# Patient Record
Sex: Female | Born: 1950 | Race: White | Hispanic: No | Marital: Married | Smoking: Never smoker
Health system: Southern US, Community
[De-identification: ages and names within clinical notes are randomized; demographics above are authoritative.]

## PROBLEM LIST (undated history)

## (undated) DIAGNOSIS — I255 Ischemic cardiomyopathy: Secondary | ICD-10-CM

## (undated) DIAGNOSIS — I219 Acute myocardial infarction, unspecified: Secondary | ICD-10-CM

## (undated) DIAGNOSIS — Z95810 Presence of automatic (implantable) cardiac defibrillator: Secondary | ICD-10-CM

## (undated) DIAGNOSIS — I10 Essential (primary) hypertension: Secondary | ICD-10-CM

## (undated) DIAGNOSIS — I509 Heart failure, unspecified: Secondary | ICD-10-CM

## (undated) DIAGNOSIS — I2510 Atherosclerotic heart disease of native coronary artery without angina pectoris: Secondary | ICD-10-CM

## (undated) DIAGNOSIS — E785 Hyperlipidemia, unspecified: Secondary | ICD-10-CM

## (undated) HISTORY — DX: Hyperlipidemia, unspecified: E78.5

## (undated) HISTORY — DX: Acute myocardial infarction, unspecified: I21.9

## (undated) HISTORY — DX: Atherosclerotic heart disease of native coronary artery without angina pectoris: I25.10

## (undated) HISTORY — PX: CARDIAC CATHETERIZATION: SHX172

## (undated) HISTORY — DX: Heart failure, unspecified: I50.9

## (undated) HISTORY — DX: Ischemic cardiomyopathy: I25.5

---

## 1999-06-30 ENCOUNTER — Inpatient Hospital Stay (HOSPITAL_COMMUNITY): Admission: AD | Admit: 1999-06-30 | Discharge: 1999-07-01 | Payer: Self-pay | Admitting: Cardiovascular Disease

## 1999-07-01 ENCOUNTER — Encounter: Payer: Self-pay | Admitting: Cardiovascular Disease

## 2005-01-13 ENCOUNTER — Inpatient Hospital Stay (HOSPITAL_BASED_OUTPATIENT_CLINIC_OR_DEPARTMENT_OTHER): Admission: RE | Admit: 2005-01-13 | Discharge: 2005-01-13 | Payer: Self-pay | Admitting: Cardiovascular Disease

## 2005-02-15 ENCOUNTER — Ambulatory Visit: Payer: Self-pay | Admitting: Internal Medicine

## 2005-02-21 ENCOUNTER — Ambulatory Visit (HOSPITAL_COMMUNITY): Admission: RE | Admit: 2005-02-21 | Discharge: 2005-02-22 | Payer: Self-pay | Admitting: Internal Medicine

## 2005-02-21 ENCOUNTER — Ambulatory Visit: Payer: Self-pay | Admitting: Internal Medicine

## 2005-02-21 HISTORY — PX: CARDIAC DEFIBRILLATOR PLACEMENT: SHX171

## 2005-03-09 ENCOUNTER — Ambulatory Visit: Payer: Self-pay | Admitting: Internal Medicine

## 2005-05-31 ENCOUNTER — Ambulatory Visit: Payer: Self-pay | Admitting: Internal Medicine

## 2006-08-12 ENCOUNTER — Emergency Department (HOSPITAL_COMMUNITY): Admission: EM | Admit: 2006-08-12 | Discharge: 2006-08-12 | Payer: Self-pay | Admitting: Emergency Medicine

## 2009-07-24 LAB — LIPID PANEL
Cholesterol: 222 mg/dL — AB (ref 0–200)
HDL: 46 mg/dL (ref 35–70)
Triglycerides: 130 mg/dL (ref 40–160)

## 2009-12-26 ENCOUNTER — Encounter: Payer: Self-pay | Admitting: Internal Medicine

## 2010-02-08 ENCOUNTER — Encounter (INDEPENDENT_AMBULATORY_CARE_PROVIDER_SITE_OTHER): Payer: Self-pay | Admitting: *Deleted

## 2010-04-20 NOTE — Miscellaneous (Signed)
Summary: Device preload  Clinical Lists Changes  Observations: Added new observation of ICD INDICATN: CM (12/26/2009 17:28) Added new observation of ICDLEADSTAT1: active (12/26/2009 17:28) Added new observation of ICDLEADSER1: [id number] V (12/26/2009 17:28) Added new observation of ICDLEADMOD1: 5366  (12/26/2009 17:28) Added new observation of ICDLEADDOI1: 02/21/2005  (12/26/2009 17:28) Added new observation of ICDLEADLOC1: RV  (12/26/2009 17:28) Added new observation of ICD IMP [name]: Nancy Bunting, [name]  (12/26/2009 17:28) Added new observation of ICD IMPL DTE: 02/21/2005  (12/26/2009 17:28) Added new observation of ICD SERL#: [id number]  (12/26/2009 17:28) Added new observation of ICD MODL#: 7232  (12/26/2009 17:28) Added new observation of ICDMANUFACTR: Medtronic  (12/26/2009 17:28) Added new observation of IDC REFER [name]: Shaune Pascal, [name]  (12/26/2009 17:28) Added new observation of ICD [name]: Nancy Bunting, [name]  (12/26/2009 17:28)       ICD Specifications Following [name]:  Nancy Bunting, [name]     Referring [name]:  Shaune Pascal, [name] ICD Vendor:  Medtronic     ICD Model Number:  7232     ICD Serial Number:  [id number] ICD DOI:  02/21/2005     ICD Implanting [name]:  Nancy Bunting, [name]  Lead 1:    Location: RV     DOI: 02/21/2005     Model #: 3329     Serial #: [id number]     Status: active  Indications::  CM

## 2010-04-20 NOTE — Letter (Signed)
Summary: Appointment - Missed  [facility], Main Office  [address]   [place], [place] [address]   Phone: [phone]  Fax: [phone]     February 08, 2010 MRN: [id number]   [facility] [address] [place], [place]  [address]   Dear Ms. Bhavsar,  Our records indicate you missed your appointment on 02-04-10 with Dr.  Ladona Ridgel.                        It is very important that we reach you to reschedule this appointment. We look forward to participating in your health care needs. Please contact us at the number listed above at your earliest convenience to reschedule this appointment.     Sincerely,    Glass blower/designer

## 2010-05-05 ENCOUNTER — Encounter (INDEPENDENT_AMBULATORY_CARE_PROVIDER_SITE_OTHER): Payer: Self-pay | Admitting: *Deleted

## 2010-05-12 ENCOUNTER — Ambulatory Visit (INDEPENDENT_AMBULATORY_CARE_PROVIDER_SITE_OTHER): Payer: PRIVATE HEALTH INSURANCE | Admitting: Cardiovascular Disease

## 2010-05-12 DIAGNOSIS — I5022 Chronic systolic (congestive) heart failure: Secondary | ICD-10-CM

## 2010-05-12 DIAGNOSIS — Z95810 Presence of automatic (implantable) cardiac defibrillator: Secondary | ICD-10-CM

## 2010-05-12 DIAGNOSIS — I252 Old myocardial infarction: Secondary | ICD-10-CM

## 2010-05-12 NOTE — Letter (Signed)
Summary: Appointment - Reminder 2  [facility], Main Office  [address]   [place], [place] [address]   Phone: [phone]  Fax: [phone]     May 05, 2010 MRN: [id number]   [facility] [address] [place] [address] [place], [place]  [address]   Dear [name],  Our records indicate that it past is time to schedule a follow-up appointment.  Dr.Taylor recommended that you follow up with [place] in November and you missed that appointment. It is very important that we reach you to schedule this appointment. We look forward to participating in your health care needs. Please contact us at the number listed above at your earliest convenience to schedule your appointment.  If you are unable to make an appointment at this time, give [place] a call so we can update our records.     Sincerely,   Glass blower/designer

## 2010-06-01 ENCOUNTER — Encounter: Payer: Self-pay | Admitting: Cardiovascular Disease

## 2010-06-01 DIAGNOSIS — I255 Ischemic cardiomyopathy: Secondary | ICD-10-CM | POA: Insufficient documentation

## 2010-06-01 DIAGNOSIS — Z95810 Presence of automatic (implantable) cardiac defibrillator: Secondary | ICD-10-CM | POA: Insufficient documentation

## 2010-06-01 DIAGNOSIS — I219 Acute myocardial infarction, unspecified: Secondary | ICD-10-CM | POA: Insufficient documentation

## 2010-06-01 DIAGNOSIS — I2510 Atherosclerotic heart disease of native coronary artery without angina pectoris: Secondary | ICD-10-CM | POA: Insufficient documentation

## 2010-06-01 DIAGNOSIS — I5022 Chronic systolic (congestive) heart failure: Secondary | ICD-10-CM | POA: Insufficient documentation

## 2010-06-15 ENCOUNTER — Encounter: Payer: Self-pay | Admitting: Internal Medicine

## 2010-06-15 ENCOUNTER — Ambulatory Visit (INDEPENDENT_AMBULATORY_CARE_PROVIDER_SITE_OTHER): Payer: Self-pay | Admitting: Internal Medicine

## 2010-06-15 DIAGNOSIS — I2510 Atherosclerotic heart disease of native coronary artery without angina pectoris: Secondary | ICD-10-CM

## 2010-06-15 DIAGNOSIS — I2589 Other forms of chronic ischemic heart disease: Secondary | ICD-10-CM

## 2010-06-15 DIAGNOSIS — I509 Heart failure, unspecified: Secondary | ICD-10-CM

## 2010-06-15 DIAGNOSIS — Z95810 Presence of automatic (implantable) cardiac defibrillator: Secondary | ICD-10-CM

## 2010-06-15 DIAGNOSIS — I255 Ischemic cardiomyopathy: Secondary | ICD-10-CM

## 2010-06-15 NOTE — Assessment & Plan Note (Signed)
She is currently angina free. Will follow.

## 2010-06-15 NOTE — Assessment & Plan Note (Signed)
Her CHF symptoms are class 2. I have asked her to maintain a low sodium diet.

## 2010-06-15 NOTE — Patient Instructions (Signed)
Your physician recommends that you schedule a follow-up appointment in: 12 MONTHS 

## 2010-06-15 NOTE — Assessment & Plan Note (Signed)
Her device is working normally. Will recheck in several months. 

## 2010-06-15 NOTE — Progress Notes (Signed)
HPI [name] returns today for ongoing evaluation and maintenance of her ICD in the setting of an ICM, s/p MI. The patient had a remote MI over 7 yrs ago. She has chronic systolic CHF and underwent prophylactic ICD implant almost 5 yrs ago. The patient has done well in the interim with no c/p, sob, or ICD shock. She has minimal peripheral edema. She is currently trying to lose weight.  Allergies  Allergen Reactions  . Codeine Nausea And Vomiting  . Ecotrin (Aspirin)     BLEEDING ULCER  . Nsaids     BLEEDING ULCER  . Sulfa Drugs Cross Reactors Nausea And Vomiting  . Penicillins Rash     Current Outpatient Prescriptions  Medication Sig Dispense Refill  . aspirin 81 MG tablet Take 81 mg by mouth daily.        . carvedilol (COREG) 25 MG tablet Take 37.5 mg by mouth 2 (two) times daily.        Marland Kitchen escitalopram (LEXAPRO) 10 MG tablet Take 10 mg by mouth daily.        . furosemide (LASIX) 40 MG tablet Take 40 mg by mouth daily.        . potassium chloride (KLOR-CON) 10 MEQ CR tablet Take 10 mEq by mouth daily.        . ramipril (ALTACE) 10 MG capsule Take 10 mg by mouth daily.        . rosuvastatin (CRESTOR) 20 MG tablet Take 20 mg by mouth daily.           Past Medical History  Diagnosis Date  . CAD (coronary artery disease)     CHRONICALLY OCCLUDED LAD WITH A LARGE ANTERIOR WALL MI   . CHF (congestive heart failure)     EF 30-40%  . ICD (implantable cardiac defibrillator), single, in situ     MEDTRONIC  . Ischemic cardiomyopathy   . Myocardial infarction     ANTERIOR WALL    ROS:   All systems reviewed and negative except as noted in the HPI.   Past Surgical History  Procedure Date  . Cardiac catheterization 01/13/05, 06/29/99  . Cardiac defibrillator placement 02/21/05    SINGLE/MEDTRONIC     Family History  Problem Relation Age of Onset  . Hypertension Father   . Heart attack Mother   . Coronary artery disease Mother   . Aneurysm Brother      History   Social  History  . Marital Status: Married    Spouse Name: N/A    Number of Children: N/A  . Years of Education: N/A   Occupational History  . Not on file.   Social History Main Topics  . Smoking status: Never Smoker   . Smokeless tobacco: Never Used  . Alcohol Use: No  . Drug Use: Not on file  . Sexually Active: Not on file   Other Topics Concern  . Not on file   Social History Narrative  . No narrative on file     BP 123/71  Pulse 67  Ht 5\' 2"  (1.575 m)  Wt 236 lb (107.049 kg)  BMI 43.17 kg/m2  Physical Exam:  Well appearing NAD HEENT: Unremarkable Neck:  No JVD, no thyromegally Lymphatics:  No adenopathy Back:  No CVA tenderness Lungs:  Clear. Well healed ICD incision. HEART:  Regular rate rhythm, no murmurs or rubs,  Abd:  Flat, positive bowel sounds, no organomegally, no rebound, no guarding Ext:  2 plus pulses, no edema, no cyanosis,  no clubbing Skin:  No rashes no nodules Neuro:  CN II through XII intact, motor grossly intact  DEVICE  Normal device function.  See PaceArt for details.   Assess/Plan:

## 2010-08-06 NOTE — Op Note (Signed)
[name], [name]                 ACCOUNT NO.:  [id number]   MEDICAL RECORD NO.:  [id number]          PATIENT TYPE:  OIB   LOCATION:  2899                         FACILITY:  [facility]   PHYSICIAN:  Doylene Canning. Ladona Ridgel, M.D.  DATE OF BIRTH:  18-Jun-1950   DATE OF PROCEDURE:  02/21/2005  DATE OF DISCHARGE:                                 OPERATIVE REPORT   PROCEDURE PERFORMED:  Implantation of a single chamber ICD.   INDICATION:  Ischemic cardiomyopathy, status post myocardial infarction with  ejection fraction of 25% and class II congestive heart failure.   INTRODUCTION:  The patient is a 60 year old woman who is status post  anterior MI years ago with a chronically occluded LAD. She has a history of  class II congestive heart failure. Her ejection fraction has gradually  decreased over time and most recently in the 20-25% range. The patient is  now referred for ICD implantation.   PROCEDURE:  After informed consent was obtained, the patient taken to the  diagnostic EP lab in the fasting state.  After the usual preparation and  draping, intravenous fentanyl and midazolam was given for sedation.  Lidocaine 30 mL was infiltrated in the left infraclavicular region.  A 9 cm  incision was carried out over this region. Electrocautery utilized dissect  down to the fascial plane. The left subclavian vein was punctured and the  Guidant model 6949 58 cm active fixation defibrillation lead serial number  [id number] was advanced into the right ventricle. Mapping was carried out.  At the final site on the RV septum near the inferior apex, the R-waves  measured 10 mV. The pacing impedance was 768 ohms and pacing threshold was  0.5 volts at 0.5 milliseconds.  A 10 volt pacing did not stimulate the  diaphragm with the lead actively fixed. The lead was secured to the  subpectoralis fascia with a figure-of-eight silk suture. The sewing sleeve  was also secured with silk suture. Electrocautery was utilized to  assure  hemostasis. Electrocautery was utilized to make subcutaneous pocket.  Kanamycin irrigation was utilized to irrigate the pocket. The Medtronic  [facility] VR model 7232 single chamber defibrillator serial number [id number]  was connected to the ventricular pacing lead and placed in the subcutaneous  pocket. Generator was secured with silk suture. Additional kanamycin was  utilized to irrigate the pocket. The generator was secured with silk suture.  At this point, the patient was more deeply sedated defibrillation and  threshold testing carried out.   After the patient was deeply sedated with fentanyl and Versed, VF was  induced with T-wave shock. A 15 joule shock was subsequently delivered  terminating ventricular fibrillation and restoring sinus rhythm.  Five  minutes was allowed to elapse and a second DFT test carried out. Again VF  was induced with T-wave shock again a 15 joule shock was delivered which  terminated ventricular fibrillation and restored sinus rhythm.  At this  point, no additional defibrillation threshold testing was carried out the  incision was closed with layer of 2-0 Vicryl, followed by layer 3-0 Vicryl,  followed by  a layer of 4-0 Vicryl. Benzoin was pained on the skin and Steri-  Strips were applied and pressure dressing was placed. The patient was  returned to her room in satisfactory condition.   COMPLICATIONS:  There were no immediate procedure complications.   RESULTS:  This demonstrates successful implantation of a Medtronic single-  chamber defibrillator in a patient with an ischemic cardiomyopathy and  congestive heart failure.           ______________________________  Doylene Canning. Ladona Ridgel, M.D.     GWT/MEDQ  D:  02/21/2005  T:  02/21/2005  Job:  [id number]   cc:   Vesta Mixer, M.D.  Fax: [phone]   Pricilla Holm, M.D.  Fax: [phone]

## 2010-08-06 NOTE — Cardiovascular Report (Signed)
NAMEROENA, SASSAMAN                 ACCOUNT NO.:  [id number]   MEDICAL RECORD NO.:  [id number]          PATIENT TYPE:  OIB   LOCATION:  1965                         FACILITY:  [facility]   PHYSICIAN:  Vesta Mixer, M.D. DATE OF BIRTH:  1951/01/06   DATE OF PROCEDURE:  01/13/2005  DATE OF DISCHARGE:  01/13/2005                              CARDIAC CATHETERIZATION   [name] is a 60 year old female with a history of coronary artery  disease. She is status post anterior wall myocardial infarction in the past.  Over the year, she has had a gradually and steadily improving left  ventricular systolic function. Recently, she had symptoms consistent with  congestive heart failure. A recent echocardiogram revealed a marked decrease  in her left ventricular function. She is referred for heart catheterization  for further evaluation.   PROCEDURE:  Left heart catheterization with coronary angiography. The right  femoral artery was easily cannulated using modified Seldinger technique.   HEMODYNAMICS:  LV pressure is 163/21 with an aortic pressure of 164/87.   ANGIOGRAPHY:  Left main:  The left main is fairly normal.   The left anterior descending artery reveals an occluded proximal artery.  There is left-to-left collaterals. The diagonal also fills via collaterals.  The left circumflex artery is normal. The right coronary artery is fairly  normal.   The left ventriculogram reveals marked left ventricular dysfunction. There  is anterior apical, apical and inferior apical akinesis. The remainder of  heart is moderately hypokinetic.   COMPLICATIONS:  None.   CONCLUSION:  1.  Single-vessel coronary artery disease involving the left anterior      descending artery. She has evidence of a previous anterior wall      myocardial infarction. We will need to do a resting thallium scan to      check for viability. If she still has viable myocardium, then we will      consider coronary artery bypass  grafting given her progressive left      ventricular dysfunction. She is stable enough to go home today.           ______________________________  Vesta Mixer, M.D.     PJN/MEDQ  D:  01/13/2005  T:  01/13/2005  Job:  [id number]   cc:   Pricilla Holm, M.D.  Fax: [phone]

## 2010-08-06 NOTE — Cardiovascular Report (Signed)
[place]  Patient:    [name], [name]                        MRN: [id number] Proc. Date: 06/29/99 Adm. Date:  [id number] Disc. Date: [id number] Attending:  Koren Bound                        Cardiac Catheterization  PROCEDURES PERFORMED:  Right and left heart catheterization with attempt at PTCA.  INDICATIONS:  [name] is a young white female with a recent onset of congestive heart failure.  She had EKG changes consistent with an anterior wall myocardial  infarction.  An echocardiogram revealed a dilated cardiomyopathy.  She is referred for heart catheterization for further evaluation.  DESCRIPTION OF PROCEDURE:  The right femoral artery and right femoral vein were  easily cannulated using the modified Seldinger technique.  HEMODYNAMICS:  The right atrial pressure had a mean of 16, right ventricular pressure 52/13, pulmonary capillary wedge pressure 29.  Pulmonary artery pressure was 51/23.  Cardiac output was 3.4 L/min, with cardiac index of 1.8 L/min by thermodilution.  There was no mitral valve gradient.  The LV pressure was 135/28, with an aortic  pressure of 135/90.  ANGIOGRAPHY:  LEFT VENTRICULOGRAM:  The left ventriculogram was performed in a 30 degree RAO position.  It revealed moderately to severely dilated left ventricle.  There was diffuse hypokinesis.  There was akinesis/dyskinesis of the anterior wall and apex. The ejection fraction was approximately 20% to 25%.  CORONARY ARTERIES: 1. The left main was relatively smooth and normal.  There were no discrete    stenoses.  2. The left anterior descending artery was severely diseased at its origin with a    diffuse 70% to 80% stenosis.  It was then occluded shortly after the origin. It    gave off one small septal branch.  The mid and distal LAD filled very slowly via    left-to-left, as well as right-to-left, collaterals.  3. The left circumflex artery  was a moderate to large vessel.  It gave off a large    first obtuse marginal artery, as well as a large second obtuse marginal artery.    These vessels are essentially normal.  4. The right coronary artery was large and normal.  There were no significant    stenoses.  The PDA and a small posterolateral segment artery were normal.  PTCA ATTEMPT:  The left main was engaged using a 7-French FL-3 guide.  A Cross-It 100 wire was used in an attempt to cross the stenosis.  We were unable to cross the lesion using the wire alone.  We then placed a 2.5 x 15 mm OpenSail balloon down to provide extra backup.  Despite multiple attempts with this wire, we were still unable to cross the lesion.  We then tried a Cross-It 200 wire with no success.  Follow-up angiography revealed a persistently occluded LAD.  There was no disruption of plaque out into the left main and flow down the left main and circumflex artery were normal.  The small septal branch coming off of the LAD remained patent.  CONCLUSIONS: 1. Recent anterior wall myocardial infarction with complete occlusion of the left    anterior descending artery.  This probably happened several weeks ago. 2. Unsuccessful percutaneous transluminal coronary angioplasty due to inability to    cross the guide wire. 3. Congestive heart failure  due to recent myocardial infarction. DD:  07/16/99 TD:  07/17/99 Job: [id number]

## 2010-08-06 NOTE — Discharge Summary (Signed)
[place]  Patient:    [name], [name]                        MRN: [id number] Adm. Date:  [id number] Disc. Date: [id number] Attending:  Koren Bound CC:         Teena Irani. Arlyce Dice, M.D.                           Discharge Summary  DISCHARGE DIAGNOSES: 1. Ischemic cardiomyopathy. 2. Congestive heart failure. 3. Recent anterior wall myocardial infarction.  DISCHARGE MEDICATIONS: 1. Coumadin 5 mg a day. 2. Lasix 40 mg a day. 3. Potassium chloride 20 mEq a day. 4. Altace 2.5 mg a day. 5. Digoxin 0.125 mg a day. 6. Toprol XL 25 mg a day.  DISPOSITION:  The patient will see Dr. Elease Hashimoto next week for a pro time and office visit.  HISTORY:  [name] is a 60 year old female who presented to the office with a recent onset of congestive heart failure.  She was found to have a globally dilated and hypercontractile left ventricle by echocardiography.  She was referred for heart catheterization for further evaluation.  HOSPITAL COURSE:  Coronary artery disease.  The patient underwent heart catheterization and was found to have an occluded left anterior descending artery.  We tried to open the artery with angioplasty but we were unable to cross the stenosis with a guidewire.  The stenosis appears to be fairly old and the occlusion probably occurred several weeks ago.  Because of the prolonged nature of the procedure and the fact that we were across the left main with a guidewire and catheters, we admitted her for overnight observation.  The patient did not have any complications.  She was discharged in satisfactory condition.  She will be discharged on the above-noted medications.  We will follow her in the office next week.  All of her other medical problems are stable. DD:  07/28/99 TD:  07/29/99 Job: [id number]

## 2010-08-06 NOTE — H&P (Signed)
NAMEPHEBE, DETTMER                 ACCOUNT NO.:  [id number]   MEDICAL RECORD NO.:  [id number]           PATIENT TYPE:   LOCATION:                                 FACILITY:   PHYSICIAN:  Vesta Mixer, M.D.      DATE OF BIRTH:   DATE OF ADMISSION:  01/13/2005  DATE OF DISCHARGE:                                HISTORY & PHYSICAL   [name] is a middle-age female with a history of coronary artery  disease.  She is admitted to the hospital with symptoms of worsening  congestive heart failure.   [name] is a middle-age female with a diagnosis of coronary artery disease.  She had a catheterization in 2001 which revealed an occluded left anterior  descending artery.  She had fairly good collateral filling to her LAD.  We  attempted to do an angioplasty but we were unable to cross the LAD with a  wire.  She has done well on medical therapy and has not really had any  significant episodes of angina.  She has been a little bit noncompliant with  her office visits and has missed several visits over the past several years.  Over the past several years, her left ventricular systolic function has  gradually fallen.  LV function at the time of her original cath revealed an  ejection fraction of around 20 to 25%.  With medical therapy, she increased  her LV function up to approximately 45 to 50%.  Over the next several years,  her EF has fallen such that now her ejection fraction is between 20 and 25%.  She has also had symptoms of congestive heart failure with evidence of leg  edema.  She has not had any episodes of angina.  She also admits to not  really exercising all that much.  She has been able to do most of her usual  activities but with a little bit of shortness of breath.   CURRENT MEDICATIONS:  1.  Cyclophenazine 180 mg a day.  2.  Altace 10 mg p.o. b.i.d.  3.  Coreg 25 mg p.o. b.i.d.  4.  Lexapro 20 mg a day.  5.  Lasix 40 mg a day.  6.  Potassium chloride 10 mEq a day.  7.   Zocor 40 mg a day.   ALLERGIES:  NO KNOWN DRUG ALLERGIES.   PAST MEDICAL HISTORY:  1.  Coronary artery disease - she has an occluded left anterior descending      artery with good collateral filling from left-to-left as well as right-      to-left collaterals.  2.  Congestive heart failure - with recent worsening.  3.  Dyslipidemia.   SOCIAL HISTORY:  The patient is a nonsmoker.  She does not drink alcohol.   FAMILY HISTORY:  Noncontributory.   REVIEW OF SYSTEMS:  Negative except for as noted in the HPI.   PHYSICAL EXAMINATION:  GENERAL APPEARANCE:  She is a middle-age female in no  acute distress.  She is alert and oriented x3 and mood and affect are  normal.  VITAL SIGNS:  Her weight is down 6 pounds to 225.  Her blood pressure is  120/76 with heart rate of 76.  HEENT:  There are 2+ carotids.  She has no bruits, no JVD, no thyromegaly.  LUNGS:  Clear to auscultation.  CARDIOVASCULAR:  Regular rate, S1 and S2.  She has a soft systolic murmur.  ABDOMEN:  Good bowel sounds and is nontender.  EXTREMITIES:  She has no clubbing, cyanosis, or edema.  NEUROLOGIC:  Nonfocal.   [name] presents with recent worsening of her left ventricular systolic  function and clinically has had some episodes of heart failure.  Her left  ventricular systolic function has clearly fallen over the past several  years.  I have recommended we proceed with heart catheterization for further  evaluation.  We discussed the risks, benefits and options of heart  catheterization.  She understands and agrees to proceed.           ______________________________  Vesta Mixer, M.D.     PJN/MEDQ  D:  01/12/2005  T:  01/12/2005  Job:  [id number]   cc:   Pricilla Holm, M.D.  Fax: [phone]

## 2010-08-06 NOTE — Discharge Summary (Signed)
[name], [name]                 ACCOUNT NO.:  [id number]   MEDICAL RECORD NO.:  [id number]          PATIENT TYPE:  OIB   LOCATION:  6532                         FACILITY:  [facility]   PHYSICIAN:  Doylene Canning. Ladona Ridgel, M.D.  DATE OF BIRTH:  Dec 20, 1950   DATE OF ADMISSION:  02/21/2005  DATE OF DISCHARGE:  02/22/2005                                 DISCHARGE SUMMARY   PRIMARY CARE PHYSICIAN:  Gloriajean Dell. Andrey Campanile, M.D.   ALLERGIES:  1.  CODEINE.  2.  PENICILLIN.  3.  ASPIRIN.   PROCEDURES:  Performed during this hospitalization include implantation of  an implantable cardioverter defibrillator, Medtronic.   PRINCIPAL DIAGNOSES:  1.  Coronary artery disease, status post chronic total occlusion of the left      anterior descending artery.  2.  Anterior wall myocardial infarction, many years ago.  3.  History of congestive heart failure.  4.  The patient's left ventricular function has decreased over time with a      range of 20-25%.   SECONDARY DIAGNOSES:  1.  Dyslipidemia.  2.  Congestive heart failure.  3.  Coronary artery disease.   HISTORY OF PRESENT ILLNESS:  The patient is a 60 year old Caucasian female  who was admitted, on February 21, 2005, for implantation of a implantable  cardioverter defibrillator.  The patient had cardio defibrillator placed on  February 21, 2005 with a VVI ICD implant.  Her indications for this implant  were an ischemic cardiomyopathy with an ejection fraction of 25%, class II  CHF.  The patient's device was interrogated, on February 22, 2005, by a  Medtronic rep which showed an impedance in the RV of 544 ohms, a threshold  of 0.5 volts at 0.2 msec, an R wave of 11.0 mV.  Values were within normal  limits.  The patient was seen, on February 22, 2005, had done very well  during the night.  Left shoulder site with no hematoma, Steri-Strips intact.   The patient will be discharged on the following medications:  1.  Lexapro 10 mg p.o. every day.  2.  Ramipril 10  mg p.o. b.i.d.  3.  Avapro 150 mg p.o. every day.  4.  Aldactone 25 mg p.o. every day.  5.  Lasix 40 mg p.o. every day.  6.  Simvastatin 40 mg p.o. q.h.s.  7.  Potassium chloride 10 mEq p.o. every day.  8.  Loratadine 10 mg p.o. every day.  9.  Carvedilol 37.5 mg p.o. b.i.d.   She will also have a followup appointment with Dr. Ladona Ridgel in the pacemaker  clinic on Wednesday, March 09, 2005 at 9 a.m.   The patient was instructed for no heavy lifting or vigorous activity for six  to eight weeks, no driving for seven days, and no showering for seven days.   DURATION OF DISCHARGE:  Less than 30 minutes.     ______________________________  April Humphrey, NP    ______________________________  Doylene Canning. Ladona Ridgel, M.D.    AH/MEDQ  D:  02/22/2005  T:  02/22/2005  Job:  [id number]   cc:  Doylene Canning. Ladona Ridgel, M.D.  [address]  [address]  [place]  [place] [address]

## 2010-09-16 ENCOUNTER — Encounter: Payer: Self-pay | Admitting: *Deleted

## 2010-09-21 ENCOUNTER — Encounter: Payer: Self-pay | Admitting: *Deleted

## 2010-10-07 ENCOUNTER — Other Ambulatory Visit: Payer: Self-pay | Admitting: Cardiovascular Disease

## 2010-10-07 NOTE — Telephone Encounter (Signed)
Fax received from pharmacy. Refill completed. Jodette Aliyah Abeyta RN  

## 2010-10-10 ENCOUNTER — Other Ambulatory Visit: Payer: Self-pay | Admitting: Cardiovascular Disease

## 2010-10-11 ENCOUNTER — Other Ambulatory Visit: Payer: Self-pay | Admitting: *Deleted

## 2010-10-11 MED ORDER — CARVEDILOL 25 MG PO TABS: 37.5000 mg | ORAL_TABLET | Freq: Two times a day (BID) | ORAL | Status: DC

## 2010-10-11 NOTE — Telephone Encounter (Signed)
Fax received from pharmacy. Refill completed. Jodette Lavetta Geier RN  

## 2010-11-05 ENCOUNTER — Other Ambulatory Visit: Payer: Self-pay | Admitting: Cardiovascular Disease

## 2010-11-05 NOTE — Telephone Encounter (Signed)
Fax received from pharmacy. Refill completed. Jodette Kimmy Parish RN  

## 2011-01-09 ENCOUNTER — Other Ambulatory Visit: Payer: Self-pay | Admitting: Cardiovascular Disease

## 2011-01-11 NOTE — Telephone Encounter (Signed)
Pt calling to check on status of refill. Please call it in.

## 2011-01-11 NOTE — Telephone Encounter (Signed)
PT WILL NEED OV FOR NEXT REFILL

## 2011-03-09 ENCOUNTER — Other Ambulatory Visit: Payer: Self-pay | Admitting: Cardiovascular Disease

## 2011-03-28 ENCOUNTER — Encounter: Payer: Self-pay | Admitting: *Deleted

## 2011-03-31 ENCOUNTER — Other Ambulatory Visit: Payer: Self-pay | Admitting: Cardiovascular Disease

## 2011-04-13 ENCOUNTER — Other Ambulatory Visit: Payer: Self-pay | Admitting: Cardiovascular Disease

## 2011-04-14 NOTE — Telephone Encounter (Signed)
Pt needs appointment then refill can be made Fax Received. Refill Completed. [name] (R.M.A)   

## 2011-05-09 ENCOUNTER — Other Ambulatory Visit: Payer: Self-pay | Admitting: Cardiovascular Disease

## 2011-05-09 NOTE — Telephone Encounter (Signed)
Fax Received. Refill Completed. [name] (R.M.A). When pt comes for appt, refills will be made.

## 2011-05-17 ENCOUNTER — Encounter: Payer: Self-pay | Admitting: Cardiovascular Disease

## 2011-05-17 ENCOUNTER — Ambulatory Visit (INDEPENDENT_AMBULATORY_CARE_PROVIDER_SITE_OTHER): Payer: Self-pay | Admitting: Cardiovascular Disease

## 2011-05-17 DIAGNOSIS — I10 Essential (primary) hypertension: Secondary | ICD-10-CM

## 2011-05-17 DIAGNOSIS — I255 Ischemic cardiomyopathy: Secondary | ICD-10-CM

## 2011-05-17 DIAGNOSIS — E785 Hyperlipidemia, unspecified: Secondary | ICD-10-CM

## 2011-05-17 DIAGNOSIS — I2510 Atherosclerotic heart disease of native coronary artery without angina pectoris: Secondary | ICD-10-CM

## 2011-05-17 DIAGNOSIS — I2589 Other forms of chronic ischemic heart disease: Secondary | ICD-10-CM

## 2011-05-17 DIAGNOSIS — I509 Heart failure, unspecified: Secondary | ICD-10-CM

## 2011-05-17 NOTE — Progress Notes (Signed)
[name] Date of Birth  10-27-1950 [facility]     [place] Office  [address]    [address]   [address] [place], [place]  [address]    [place], [place]  [address] [phone]  Fax  [phone]  [phone]  Fax [phone]  Problem list: 1. Coronary artery disease-status post chronic LAD occlusion with a large anterior wall myocardial infarction 2. Congestive heart failure-EF of 30-40% 3. AICD placement 4. Hyperlipidemia 5 obesity  History of Present Illness:  [name] is doing very well from a cardiac standpoint. She's not had any episodes of chest pain or shortness of breath. She is able to do all of her normal activities without any significant problems.  She has not had much success in losing weight  She denies any syncope or presyncope.  Current Outpatient Prescriptions on File Prior to Visit  Medication Sig Dispense Refill  . ALTACE 10 MG capsule TAKE ONE CAPSULE BY MOUTH TWICE DAILY  60 each  5  . aspirin 81 MG tablet Take 81 mg by mouth daily.        . carvedilol (COREG) 25 MG tablet TAKE ONE & ONE-HALF TABLETS BY MOUTH TWICE DAILY  90 tablet  2  . escitalopram (LEXAPRO) 10 MG tablet Take 10 mg by mouth daily.        . furosemide (LASIX) 40 MG tablet TAKE ONE TABLET BY MOUTH EVERY DAY  30 tablet  0  . potassium chloride (MICRO-K) 10 MEQ CR capsule TAKE ONE CAPSULE BY MOUTH EVERY DAY  90 capsule  3  . rosuvastatin (CRESTOR) 20 MG tablet Take 20 mg by mouth daily.          Allergies  Allergen Reactions  . Codeine Nausea And Vomiting  . Ecotrin (Aspirin)     BLEEDING ULCER  . Nsaids     BLEEDING ULCER  . Sulfa Drugs Cross Reactors Nausea And Vomiting  . Penicillins Rash    Past Medical History  Diagnosis Date  . CAD (coronary artery disease)     CHRONICALLY OCCLUDED LAD WITH A LARGE ANTERIOR WALL MI   . CHF (congestive heart failure)     EF 30-40%  . ICD (implantable cardiac defibrillator), single, in situ     MEDTRONIC  . Ischemic  cardiomyopathy   . Myocardial infarction     ANTERIOR WALL    Past Surgical History  Procedure Date  . Cardiac catheterization 01/13/05, 06/29/99  . Cardiac defibrillator placement 02/21/05    SINGLE/MEDTRONIC    History  Smoking status  . Never Smoker   Smokeless tobacco  . Never Used    History  Alcohol Use No    Family History  Problem Relation Age of Onset  . Hypertension Father   . Heart attack Mother   . Coronary artery disease Mother   . Aneurysm Brother     Reviw of Systems:  Reviewed in the HPI.  All other systems are negative.  Physical Exam: Blood pressure 129/75, pulse 67, height 5\' 2"  (1.575 m), weight 235 lb (106.595 kg). General: Well developed, well nourished, in no acute distress.  She is moderately obese.  Head: Normocephalic, atraumatic, sclera non-icteric, mucus membranes are moist,   Neck: Supple. Carotids are 2 + without bruits. No JVD  Lungs: Clear bilaterally to auscultation.  Heart: regular rate  With normal  S1 S2. No murmurs, gallops or rubs.  Abdomen: Soft, non-tender, non-distended with normal bowel sounds. No hepatomegaly. She is  obese. No rebound/guarding. No masses.  Msk:  Strength and tone are normal  Extremities: No clubbing or cyanosis. No edema.  Distal pedal pulses are 2+ and equal bilaterally.  Neuro: Alert and oriented X 3.  Psych:  Responds to questions appropriately with a normal affect.  ECG: Normal sinus rhythm. Low-voltage QRS. There are no changes from her previous tracing.  Assessment / Plan:

## 2011-05-17 NOTE — Assessment & Plan Note (Signed)
[name] seems to be doing fairly well. She's not having any severe symptoms of chest pain or shortness breath. We'll continue with her same medication. She's had an AICD placed for her EF of around 30%. She's not had any ventricular tachycardia.  We will continue with her same medications  Once again asked her to work on a good diet and exercise program.

## 2011-05-17 NOTE — Patient Instructions (Signed)
Your physician wants you to follow-up in: 6 months  You will receive a reminder letter in the mail two months in advance. If you don't receive a letter, please call our office to schedule the follow-up appointment.  Your physician recommends that you return for a FASTING lipid profile: 6 months   

## 2011-06-13 ENCOUNTER — Other Ambulatory Visit: Payer: Self-pay | Admitting: Cardiovascular Disease

## 2011-06-17 ENCOUNTER — Other Ambulatory Visit: Payer: Self-pay | Admitting: Internal Medicine

## 2011-06-20 ENCOUNTER — Other Ambulatory Visit: Payer: Self-pay

## 2011-06-20 MED ORDER — CARVEDILOL 25 MG PO TABS: 25.0000 mg | ORAL_TABLET | ORAL | Status: DC

## 2011-07-05 ENCOUNTER — Ambulatory Visit (INDEPENDENT_AMBULATORY_CARE_PROVIDER_SITE_OTHER): Payer: Self-pay | Admitting: Internal Medicine

## 2011-07-05 ENCOUNTER — Encounter: Payer: Self-pay | Admitting: Internal Medicine

## 2011-07-05 VITALS — BP 124/70 | HR 66 | Ht 62.0 in | Wt 234.1 lb

## 2011-07-05 DIAGNOSIS — I509 Heart failure, unspecified: Secondary | ICD-10-CM

## 2011-07-05 DIAGNOSIS — Z95810 Presence of automatic (implantable) cardiac defibrillator: Secondary | ICD-10-CM

## 2011-07-05 DIAGNOSIS — E785 Hyperlipidemia, unspecified: Secondary | ICD-10-CM | POA: Insufficient documentation

## 2011-07-05 DIAGNOSIS — I255 Ischemic cardiomyopathy: Secondary | ICD-10-CM

## 2011-07-05 DIAGNOSIS — I2589 Other forms of chronic ischemic heart disease: Secondary | ICD-10-CM

## 2011-07-05 LAB — ICD DEVICE OBSERVATION
BATTERY VOLTAGE: 3 V
DEV-0020ICD: NEGATIVE
PACEART VT: 0
RV LEAD AMPLITUDE: 8.2 mv
TZAT-0004FASTVT: 8
TZAT-0005FASTVT: 88 pct
TZAT-0011SLOWVT: 10 ms
TZAT-0011SLOWVT: 10 ms
TZAT-0012FASTVT: 200 ms
TZAT-0012SLOWVT: 200 ms
TZAT-0012SLOWVT: 200 ms
TZAT-0013FASTVT: 1
TZAT-0019SLOWVT: 8 V
TZAT-0019SLOWVT: 8 V
TZON-0003FASTVT: 240 ms
TZON-0003SLOWVT: 340 ms
TZON-0005SLOWVT: 12
TZON-0008SLOWVT: 0 ms
TZON-0010AFLUTTER: 30 ms
TZON-0011AFLUTTER: 70
TZST-0001FASTVT: 3
TZST-0001FASTVT: 5
TZST-0001SLOWVT: 4
TZST-0001SLOWVT: 6
TZST-0003FASTVT: 35 J
TZST-0003FASTVT: 35 J
TZST-0003FASTVT: 35 J
TZST-0003SLOWVT: 25 J
TZST-0003SLOWVT: 35 J
VENTRICULAR PACING ICD: 0 pct

## 2011-07-05 NOTE — Assessment & Plan Note (Signed)
The patient has done very well on Crestor. Unfortunately she has had trouble paying for her prescriptions. We will switch her to generic atorvastatin.

## 2011-07-05 NOTE — Assessment & Plan Note (Signed)
Her symptoms are class II. Today we will try and switch her to a different ACE inhibitor. I discussed this with her primary cardiologist.

## 2011-07-05 NOTE — Assessment & Plan Note (Signed)
Her device is working normally. We'll plan to recheck in several months. 

## 2011-07-05 NOTE — Patient Instructions (Signed)
Your physician wants you to follow-up in: 12 months with Dr Court Joy will receive a reminder letter in the mail two months in advance. If you don't receive a letter, please call our office to schedule the follow-up appointment.   Remote monitoring is used to monitor your Pacemaker of ICD from home. This monitoring reduces the number of office visits required to check your device to one time per year. It allows [place] to keep an eye on the functioning of your device to ensure it is working properly. You are scheduled for a device check from home on 10/06/2011. You may send your transmission at any time that day. If you have a wireless device, the transmission will be sent automatically. After your physician reviews your transmission, you will receive a postcard with your next transmission date.

## 2011-07-05 NOTE — Progress Notes (Signed)
HPI [name] returns today for followup. She is a very pleasant middle-aged woman with an ischemic cardiomyopathy status post myocardial infarction. She has chronic class II congestive heart failure. She has dyslipidemia. In the interim, she has done well. She denies chest pain, shortness of breath, or peripheral edema. She has been bothered by the high cost of her medications. Allergies  Allergen Reactions  . Codeine Nausea And Vomiting  . Ecotrin (Aspirin)     BLEEDING ULCER  . Nsaids     BLEEDING ULCER  . Sulfa Drugs Cross Reactors Nausea And Vomiting  . Penicillins Rash     Current Outpatient Prescriptions  Medication Sig Dispense Refill  . ALTACE 10 MG capsule TAKE ONE CAPSULE BY MOUTH TWICE DAILY  60 each  5  . aspirin 81 MG tablet Take 81 mg by mouth daily.        . carvedilol (COREG) 25 MG tablet Take 1 tablet (25 mg total) by mouth as directed. TAKE ONE & ONE-HALF TABLETS BY MOUTH TWICE DAILY  90 tablet  2  . escitalopram (LEXAPRO) 10 MG tablet Take 10 mg by mouth daily.        . furosemide (LASIX) 40 MG tablet TAKE ONE TABLET BY MOUTH EVERY DAY  30 tablet  5  . potassium chloride (MICRO-K) 10 MEQ CR capsule TAKE ONE CAPSULE BY MOUTH EVERY DAY  90 capsule  3  . rosuvastatin (CRESTOR) 20 MG tablet Take 20 mg by mouth daily.           Past Medical History  Diagnosis Date  . CAD (coronary artery disease)     CHRONICALLY OCCLUDED LAD WITH A LARGE ANTERIOR WALL MI   . CHF (congestive heart failure)     EF 30-40%  . ICD (implantable cardiac defibrillator), single, in situ     [employer]TRONIC  . Ischemic cardiomyopathy   . Myocardial infarction     ANTERIOR WALL  . Dyslipidemia     ROS:   All systems reviewed and negative except as noted in the HPI.   Past Surgical History  Procedure Date  . Cardiac catheterization 01/13/05, 06/29/99  . Cardiac defibrillator placement 02/21/05    SINGLE/MEDTRONIC     Family History  Problem Relation Age of Onset  . Hypertension  Father   . Heart attack Mother   . Coronary artery disease Mother   . Aneurysm Brother      History   Social History  . Marital Status: Married    Spouse Name: N/A    Number of Children: N/A  . Years of Education: N/A   Occupational History  . Not on file.   Social History Main Topics  . Smoking status: Never Smoker   . Smokeless tobacco: Never Used  . Alcohol Use: No  . Drug Use: Not on file  . Sexually Active: Not on file   Other Topics Concern  . Not on file   Social History Narrative  . No narrative on file     BP 124/70  Pulse 66  Ht 5\' 2"  (1.575 m)  Wt 106.196 kg (234 lb 1.9 oz)  BMI 42.82 kg/m2  Physical Exam:  Well appearing middle-aged woman, NAD HEENT: Unremarkable Neck:  No JVD, no thyromegally Lymphatics:  No adenopathy Back:  No CVA tenderness Lungs:  Clear with no wheezes, rales, or rhonchi. HEART:  Regular rate rhythm, no murmurs, no rubs, no clicks Abd:  soft, positive bowel sounds, no organomegally, no rebound, no guarding Ext:  2 plus pulses, no edema, no cyanosis, no clubbing Skin:  No rashes no nodules Neuro:  CN II through XII intact, motor grossly intact   DEVICE  Normal device function.  See PaceArt for details.   Assess/Plan:

## 2011-07-07 ENCOUNTER — Encounter: Payer: Self-pay | Admitting: *Deleted

## 2011-07-13 ENCOUNTER — Encounter: Payer: Self-pay | Admitting: *Deleted

## 2011-07-13 ENCOUNTER — Telehealth: Payer: Self-pay | Admitting: Internal Medicine

## 2011-07-13 MED ORDER — ATORVASTATIN CALCIUM 40 MG PO TABS: 40.0000 mg | ORAL_TABLET | Freq: Every day | ORAL | Status: DC

## 2011-07-13 MED ORDER — LISINOPRIL 20 MG PO TABS: 20.0000 mg | ORAL_TABLET | Freq: Every day | ORAL | Status: DC

## 2011-07-13 MED ORDER — POTASSIUM CHLORIDE ER 10 MEQ PO TBCR: 10.0000 meq | EXTENDED_RELEASE_TABLET | Freq: Every day | ORAL | Status: DC

## 2011-07-13 NOTE — Telephone Encounter (Addendum)
Pt taking altace 10 mg and the potassium and dr Ladona Ridgel was to check with dr Elease Hashimoto to see if ok that pt went on generic and call it in and rx not at pharmacy, [facility] [place], pls call pt , when call cell number you have to dial [phone]

## 2011-07-13 NOTE — Telephone Encounter (Signed)
Medications called in. Patient aware.

## 2011-07-25 ENCOUNTER — Telehealth: Payer: Self-pay | Admitting: Internal Medicine

## 2011-07-25 NOTE — Telephone Encounter (Signed)
New msg Pt was calling about missing transmission

## 2011-07-25 NOTE — Telephone Encounter (Signed)
Pt received a letter for not sending transmission on 07-07-11 but pt was seen in office by GT on 07-05-11. Instructed pt to disregard letter.

## 2011-09-19 ENCOUNTER — Other Ambulatory Visit: Payer: Self-pay | Admitting: Internal Medicine

## 2011-09-19 NOTE — Telephone Encounter (Signed)
Fax Received. Refill Completed. [name] (R.M.A)   

## 2011-10-06 ENCOUNTER — Ambulatory Visit (INDEPENDENT_AMBULATORY_CARE_PROVIDER_SITE_OTHER): Payer: Self-pay | Admitting: *Deleted

## 2011-10-06 ENCOUNTER — Encounter: Payer: Self-pay | Admitting: Internal Medicine

## 2011-10-06 DIAGNOSIS — I255 Ischemic cardiomyopathy: Secondary | ICD-10-CM

## 2011-10-06 DIAGNOSIS — I2589 Other forms of chronic ischemic heart disease: Secondary | ICD-10-CM

## 2011-10-12 LAB — REMOTE ICD DEVICE
BATTERY VOLTAGE: 3 V
BRDY-0002RV: 40 {beats}/min
CHARGE TIME: 8.97 s
DEV-0020ICD: NEGATIVE
TZAT-0001FASTVT: 1
TZAT-0004FASTVT: 8
TZAT-0004SLOWVT: 8
TZAT-0004SLOWVT: 8
TZAT-0005FASTVT: 88 pct
TZAT-0011SLOWVT: 10 ms
TZAT-0011SLOWVT: 10 ms
TZAT-0012SLOWVT: 200 ms
TZAT-0012SLOWVT: 200 ms
TZAT-0013FASTVT: 1
TZAT-0019SLOWVT: 8 V
TZAT-0019SLOWVT: 8 V
TZAT-0020SLOWVT: 1.6 ms
TZON-0003SLOWVT: 340 ms
TZON-0010AFLUTTER: 30 ms
TZON-0011AFLUTTER: 70
TZST-0001FASTVT: 2
TZST-0001FASTVT: 4
TZST-0001FASTVT: 5
TZST-0001SLOWVT: 5
TZST-0003FASTVT: 25 J
TZST-0003FASTVT: 35 J
TZST-0003FASTVT: 35 J
TZST-0003FASTVT: 35 J
TZST-0003SLOWVT: 25 J
TZST-0003SLOWVT: 35 J

## 2011-11-01 ENCOUNTER — Encounter: Payer: Self-pay | Admitting: *Deleted

## 2011-12-02 ENCOUNTER — Other Ambulatory Visit: Payer: Self-pay | Admitting: Cardiovascular Disease

## 2011-12-02 NOTE — Telephone Encounter (Signed)
Fax Received. Refill Completed. [name] (R.M.A)   

## 2012-01-16 ENCOUNTER — Encounter: Payer: Self-pay | Admitting: Internal Medicine

## 2012-01-16 ENCOUNTER — Ambulatory Visit (INDEPENDENT_AMBULATORY_CARE_PROVIDER_SITE_OTHER): Payer: Self-pay | Admitting: *Deleted

## 2012-01-16 ENCOUNTER — Other Ambulatory Visit: Payer: Self-pay | Admitting: Internal Medicine

## 2012-01-16 DIAGNOSIS — I255 Ischemic cardiomyopathy: Secondary | ICD-10-CM

## 2012-01-16 DIAGNOSIS — I2589 Other forms of chronic ischemic heart disease: Secondary | ICD-10-CM

## 2012-01-18 LAB — REMOTE ICD DEVICE
BATTERY VOLTAGE: 2.97 V
RV LEAD AMPLITUDE: 8.2 mv
RV LEAD IMPEDENCE ICD: 448 Ohm
TOT-0006: 20130718000000
TZAT-0001SLOWVT: 1
TZAT-0001SLOWVT: 2
TZAT-0005FASTVT: 88 pct
TZAT-0011FASTVT: 10 ms
TZAT-0012FASTVT: 200 ms
TZAT-0018FASTVT: NEGATIVE
TZAT-0018SLOWVT: NEGATIVE
TZAT-0018SLOWVT: NEGATIVE
TZAT-0019SLOWVT: 8 V
TZAT-0019SLOWVT: 8 V
TZAT-0020SLOWVT: 1.6 ms
TZAT-0020SLOWVT: 1.6 ms
TZON-0003FASTVT: 240 ms
TZON-0005SLOWVT: 12
TZON-0008SLOWVT: 0 ms
TZON-0010AFLUTTER: 30 ms
TZON-0010SLOWVT: 30 ms
TZON-0011AFLUTTER: 70
TZST-0001FASTVT: 3
TZST-0001FASTVT: 5
TZST-0001FASTVT: 6
TZST-0001SLOWVT: 4
TZST-0001SLOWVT: 5
TZST-0003FASTVT: 35 J
TZST-0003FASTVT: 35 J
TZST-0003SLOWVT: 25 J
TZST-0003SLOWVT: 35 J
TZST-0003SLOWVT: 35 J
VENTRICULAR PACING ICD: 0 pct

## 2012-02-08 ENCOUNTER — Encounter: Payer: Self-pay | Admitting: *Deleted

## 2012-04-23 ENCOUNTER — Encounter: Payer: Self-pay | Admitting: *Deleted

## 2012-04-27 ENCOUNTER — Encounter: Payer: Self-pay | Admitting: *Deleted

## 2012-05-07 ENCOUNTER — Other Ambulatory Visit: Payer: Self-pay | Admitting: *Deleted

## 2012-05-07 NOTE — Telephone Encounter (Signed)
Per refill line 

## 2012-05-08 ENCOUNTER — Other Ambulatory Visit: Payer: Self-pay

## 2012-05-08 MED ORDER — FUROSEMIDE 40 MG PO TABS: 40.0000 mg | ORAL_TABLET | Freq: Every day | ORAL | Status: DC

## 2012-06-21 ENCOUNTER — Telehealth: Payer: Self-pay | Admitting: Cardiovascular Disease

## 2012-06-21 ENCOUNTER — Other Ambulatory Visit: Payer: Self-pay | Admitting: Internal Medicine

## 2012-06-21 NOTE — Telephone Encounter (Signed)
New problem   Pt is having neck pain/ears stopped up and neck looks swollen. Pt would like to talk to a nurse about these symptoms.

## 2012-06-21 NOTE — Telephone Encounter (Signed)
Follow Up   Pt following up on call from earlier. Called in again to speak to nurse.

## 2012-06-21 NOTE — Telephone Encounter (Signed)
Will forward pt complaint to Dennis Bast to discuss with Dr. Ladona Ridgel.

## 2012-06-21 NOTE — Telephone Encounter (Signed)
Pt c/o poss sinus infection and left ear and neck discomfort when she lays down at night, wonders if it could be blocked arteries in her neck? Told pt to have her pcp see her to evaluate and they will send her her if they feel its neccessary, pt agreed to plan.

## 2012-06-22 ENCOUNTER — Ambulatory Visit: Payer: Self-pay | Admitting: Cardiovascular Disease

## 2012-07-09 ENCOUNTER — Other Ambulatory Visit: Payer: Self-pay | Admitting: Internal Medicine

## 2012-07-09 ENCOUNTER — Encounter: Payer: Self-pay | Admitting: Emergency Medicine

## 2012-07-23 ENCOUNTER — Other Ambulatory Visit: Payer: Self-pay | Admitting: Internal Medicine

## 2012-07-24 ENCOUNTER — Encounter: Payer: Self-pay | Admitting: Internal Medicine

## 2012-07-28 ENCOUNTER — Other Ambulatory Visit: Payer: Self-pay | Admitting: Internal Medicine

## 2012-07-30 ENCOUNTER — Ambulatory Visit: Payer: Self-pay | Admitting: Cardiovascular Disease

## 2012-07-31 ENCOUNTER — Other Ambulatory Visit: Payer: Self-pay | Admitting: *Deleted

## 2012-07-31 MED ORDER — FUROSEMIDE 40 MG PO TABS: 40.0000 mg | ORAL_TABLET | Freq: Every day | ORAL | Status: DC

## 2012-07-31 NOTE — Telephone Encounter (Signed)
Fax Received. Refill Completed. Horton Ellithorpe Chowoe (R.M.A)  No refills until appointment  

## 2012-08-10 ENCOUNTER — Encounter: Payer: Self-pay | Admitting: Internal Medicine

## 2012-08-10 ENCOUNTER — Ambulatory Visit (INDEPENDENT_AMBULATORY_CARE_PROVIDER_SITE_OTHER): Payer: BC Managed Care – PPO | Admitting: Internal Medicine

## 2012-08-10 DIAGNOSIS — I255 Ischemic cardiomyopathy: Secondary | ICD-10-CM

## 2012-08-10 DIAGNOSIS — I2589 Other forms of chronic ischemic heart disease: Secondary | ICD-10-CM

## 2012-08-10 DIAGNOSIS — I509 Heart failure, unspecified: Secondary | ICD-10-CM

## 2012-08-10 DIAGNOSIS — Z95810 Presence of automatic (implantable) cardiac defibrillator: Secondary | ICD-10-CM

## 2012-08-10 LAB — ICD DEVICE OBSERVATION
RV LEAD AMPLITUDE: 9.8 mv
RV LEAD THRESHOLD: 1 V
TZAT-0001SLOWVT: 1
TZAT-0001SLOWVT: 2
TZAT-0004SLOWVT: 8
TZAT-0005SLOWVT: 88 pct
TZAT-0005SLOWVT: 91 pct
TZAT-0011FASTVT: 10 ms
TZAT-0012FASTVT: 200 ms
TZAT-0012SLOWVT: 200 ms
TZAT-0012SLOWVT: 200 ms
TZAT-0013FASTVT: 1
TZAT-0013SLOWVT: 2
TZAT-0013SLOWVT: 2
TZAT-0018FASTVT: NEGATIVE
TZAT-0018SLOWVT: NEGATIVE
TZAT-0018SLOWVT: NEGATIVE
TZAT-0019FASTVT: 8 V
TZAT-0020SLOWVT: 1.6 ms
TZAT-0020SLOWVT: 1.6 ms
TZON-0003SLOWVT: 340 ms
TZON-0004SLOWVT: 16
TZON-0005SLOWVT: 12
TZON-0008FASTVT: 0 ms
TZON-0010AFLUTTER: 30 ms
TZON-0010SLOWVT: 30 ms
TZON-0011AFLUTTER: 70
TZST-0001FASTVT: 3
TZST-0001FASTVT: 5
TZST-0001FASTVT: 6
TZST-0001SLOWVT: 4
TZST-0001SLOWVT: 5
TZST-0003FASTVT: 35 J
TZST-0003FASTVT: 35 J
TZST-0003SLOWVT: 25 J
TZST-0003SLOWVT: 35 J
VENTRICULAR PACING ICD: 0.1 pct

## 2012-08-10 NOTE — Progress Notes (Signed)
HPI [name] returns today for followup. She is a very pleasant 62 year old woman with an ischemic cardiomyopathy, status post anterior MI, with a chronically occluded LAD. In the interim, she has been stable. She denies chest pain, shortness of breath, or peripheral edema. She has had no ICD shocks. Her heart failure symptoms are class II. The patient is bothered most by her inability to lose weight. She states that she is active and exercises regularly for up to an hour at a time. Despite this, and dieting, she remains overweight. Allergies  Allergen Reactions  . Codeine Nausea And Vomiting  . Ecotrin (Aspirin)     BLEEDING ULCER  . Nsaids     BLEEDING ULCER  . Sulfa Drugs Cross Reactors Nausea And Vomiting  . Penicillins Rash     Current Outpatient Prescriptions  Medication Sig Dispense Refill  . aspirin 81 MG tablet Take 81 mg by mouth daily.        Marland Kitchen atorvastatin (LIPITOR) 40 MG tablet Take 1 tablet (40 mg total) by mouth daily.  30 tablet  11  . carvedilol (COREG) 25 MG tablet TAKE ONE & ONE-HALF TABLETS BY MOUTH TWICE DAILY....MORE REFILLS WILL BE GIVEN AT APPOINTMENT  90 tablet  0  . escitalopram (LEXAPRO) 10 MG tablet Take 10 mg by mouth daily.        . furosemide (LASIX) 40 MG tablet Take 1 tablet (40 mg total) by mouth daily.  30 tablet  2  . KLOR-CON M10 10 MEQ tablet TAKE ONE TABLET BY MOUTH EVERY DAY  30 tablet  5  . lisinopril (PRINIVIL,ZESTRIL) 20 MG tablet TAKE ONE TABLET BY MOUTH EVERY DAY  30 tablet  5  . [DISCONTINUED] potassium chloride (K-DUR) 10 MEQ tablet Take 1 tablet (10 mEq total) by mouth daily.  30 tablet  11   No current facility-administered medications for this visit.     Past Medical History  Diagnosis Date  . CAD (coronary artery disease)     CHRONICALLY OCCLUDED LAD WITH A LARGE ANTERIOR WALL MI   . CHF (congestive heart failure)     EF 30-40%  . ICD (implantable cardiac defibrillator), single, in situ     [employer]TRONIC  . Ischemic cardiomyopathy    . Myocardial infarction     ANTERIOR WALL  . Dyslipidemia     ROS:   All systems reviewed and negative except as noted in the HPI.   Past Surgical History  Procedure Laterality Date  . Cardiac catheterization  01/13/05, 06/29/99  . Cardiac defibrillator placement  02/21/05    SINGLE/MEDTRONIC     Family History  Problem Relation Age of Onset  . Hypertension Father   . Heart attack Mother   . Coronary artery disease Mother   . Aneurysm Brother      History   Social History  . Marital Status: Married    Spouse Name: N/A    Number of Children: N/A  . Years of Education: N/A   Occupational History  . Not on file.   Social History Main Topics  . Smoking status: Never Smoker   . Smokeless tobacco: Never Used  . Alcohol Use: No  . Drug Use: Not on file  . Sexually Active: Not on file   Other Topics Concern  . Not on file   Social History Narrative  . No narrative on file     BP 115/72  Pulse 62  Ht 5\' 2"  (1.575 m)  Wt 249 lb 11.2 oz (113.263  kg)  BMI 45.66 kg/m2  Physical Exam:  Well appearing obese, middle-age woman,NAD HEENT: Unremarkable Neck:  6 cm JVD, no thyromegally Back:  No CVA tenderness Lungs:  Clear with no wheezes, rales, or rhonchi. HEART:  Regular rate rhythm, no murmurs, no rubs, no clicks Abd:  soft, positive bowel sounds, no organomegally, no rebound, no guarding Ext:  2 plus pulses, no edema, no cyanosis, no clubbing Skin:  No rashes no nodules Neuro:  CN II through XII intact, motor grossly intact   DEVICE  Normal device function.  See PaceArt for details.   Assess/Plan:

## 2012-08-10 NOTE — Assessment & Plan Note (Signed)
Her chronic systolic heart failure is class II. She will continue her current medical therapy, and I've encouraged the patient to maintain a low-salt diet.

## 2012-08-10 NOTE — Assessment & Plan Note (Signed)
Her Medtronicsingle chamber ICD is working normally. We'll plan to recheck in several months. 

## 2012-08-10 NOTE — Patient Instructions (Addendum)
Your physician wants you to follow-up in: 12 months with Dr Allred You will receive a reminder letter in the mail two months in advance. If you don't receive a letter, please call our office to schedule the follow-up appointment.   Remote monitoring is used to monitor your Pacemaker of ICD from home. This monitoring reduces the number of office visits required to check your device to one time per year. It allows us to keep an eye on the functioning of your device to ensure it is working properly. You are scheduled for a device check from home on 11/12/12. You may send your transmission at any time that day. If you have a wireless device, the transmission will be sent automatically. After your physician reviews your transmission, you will receive a postcard with your next transmission date.   

## 2012-08-10 NOTE — Assessment & Plan Note (Signed)
She denies anginal symptoms. I've encouraged the patient increase her physical activity. She will continue her current medications.

## 2012-08-22 ENCOUNTER — Other Ambulatory Visit: Payer: Self-pay | Admitting: Internal Medicine

## 2012-09-11 ENCOUNTER — Encounter: Payer: Self-pay | Admitting: Cardiovascular Disease

## 2012-09-14 ENCOUNTER — Ambulatory Visit (INDEPENDENT_AMBULATORY_CARE_PROVIDER_SITE_OTHER): Payer: BC Managed Care – PPO | Admitting: Cardiovascular Disease

## 2012-09-14 ENCOUNTER — Encounter: Payer: Self-pay | Admitting: Cardiovascular Disease

## 2012-09-14 VITALS — BP 132/80 | HR 65 | Wt 248.1 lb

## 2012-09-14 DIAGNOSIS — I255 Ischemic cardiomyopathy: Secondary | ICD-10-CM

## 2012-09-14 DIAGNOSIS — I5022 Chronic systolic (congestive) heart failure: Secondary | ICD-10-CM

## 2012-09-14 DIAGNOSIS — I509 Heart failure, unspecified: Secondary | ICD-10-CM

## 2012-09-14 DIAGNOSIS — I2589 Other forms of chronic ischemic heart disease: Secondary | ICD-10-CM

## 2012-09-14 LAB — BASIC METABOLIC PANEL
BUN: 11 mg/dL (ref 6–23)
Creatinine, Ser: 0.7 mg/dL (ref 0.4–1.2)
GFR: 88.42 mL/min (ref >60.00)

## 2012-09-14 NOTE — Patient Instructions (Signed)
Your physician wants you to follow-up in: 1 year  You will receive a reminder letter in the mail two months in advance. If you don't receive a letter, please call our office to schedule the follow-up appointment.   Your physician recommends that you return for lab work in: today/ bmet   Please have your medical DR send [place] a copy of you liver and cholesterol results

## 2012-09-14 NOTE — Progress Notes (Signed)
[name] Date of Birth  04-Dec-1950 [facility]     [place] Office  [address]    [address]   [address] [place], [place]  [address]    [place], [place]  [address] [phone]  Fax  [phone]  [phone]  Fax [phone]  Problem list: 1. Coronary artery disease-status post chronic LAD occlusion with a large anterior wall myocardial infarction 2. Congestive heart failure-EF of 30-40% 3. AICD placement 4. Hyperlipidemia 5 obesity  History of Present Illness:  [name] is doing very well from a cardiac standpoint. She's not had any episodes of chest pain or shortness of breath. She is able to do all of her normal activities without any significant problems.  She has not had much success in losing weight  She denies any syncope or presyncope.  September 14, 2012:  [name]  is doing well. She's not having any episodes of chest pain or shortness of breath.  Her ICD has been interrogated recently and was found to be working properly.   She has no CP or dyspnea when she does her regular activities.  She does not get any regular exercise.    Current Outpatient Prescriptions on File Prior to Visit  Medication Sig Dispense Refill  . aspirin 81 MG tablet Take 81 mg by mouth daily.        Marland Kitchen atorvastatin (LIPITOR) 40 MG tablet Take 1 tablet (40 mg total) by mouth daily.  30 tablet  11  . carvedilol (COREG) 25 MG tablet TAKE ONE & ONE-HALF TABLETS BY MOUTH TWICE DAILY (MORE  REFILLS  GIVEN  AT  NEXT  APPOINTMENT)  90 tablet  3  . escitalopram (LEXAPRO) 10 MG tablet Take 10 mg by mouth daily.        . furosemide (LASIX) 40 MG tablet Take 1 tablet (40 mg total) by mouth daily.  30 tablet  2  . KLOR-CON M10 10 MEQ tablet TAKE ONE TABLET BY MOUTH EVERY DAY  30 tablet  5  . lisinopril (PRINIVIL,ZESTRIL) 20 MG tablet TAKE ONE TABLET BY MOUTH EVERY DAY  30 tablet  5  . [DISCONTINUED] potassium chloride (K-DUR) 10 MEQ tablet Take 1 tablet (10 mEq total) by mouth daily.  30  tablet  11   No current facility-administered medications on file prior to visit.    Allergies  Allergen Reactions  . Codeine Nausea And Vomiting  . Ecotrin (Aspirin)     BLEEDING ULCER  . Nsaids     BLEEDING ULCER  . Sulfa Drugs Cross Reactors Nausea And Vomiting  . Penicillins Rash    Past Medical History  Diagnosis Date  . CAD (coronary artery disease)     CHRONICALLY OCCLUDED LAD WITH A LARGE ANTERIOR WALL MI   . CHF (congestive heart failure)     EF 30-40%  . ICD (implantable cardiac defibrillator), single, in situ     [employer]TRONIC  . Ischemic cardiomyopathy   . Myocardial infarction     ANTERIOR WALL  . Dyslipidemia     Past Surgical History  Procedure Laterality Date  . Cardiac catheterization  01/13/05, 06/29/99  . Cardiac defibrillator placement  02/21/05    SINGLE/MEDTRONIC    History  Smoking status  . Never Smoker   Smokeless tobacco  . Never Used    History  Alcohol Use No    Family History  Problem Relation Age of Onset  . Hypertension Father   . Heart attack Mother   .  Coronary artery disease Mother   . Aneurysm Brother     Reviw of Systems:  Reviewed in the HPI.  All other systems are negative.  Physical Exam: Blood pressure 132/80, pulse 65, weight 248 lb 1.9 oz (112.546 kg). General: Well developed, well nourished, in no acute distress.  She is moderately obese.  Head: Normocephalic, atraumatic, sclera non-icteric, mucus membranes are moist,   Neck: Supple. Carotids are 2 + without bruits. No JVD  Lungs: Clear bilaterally to auscultation.  Heart: regular rate  With normal  S1 S2. No murmurs, gallops or rubs.  Abdomen: Soft, non-tender, non-distended with normal bowel sounds. No hepatomegaly. She is obese. No rebound/guarding. No masses.  Msk:  Strength and tone are normal  Extremities: No clubbing or cyanosis. No edema.  Distal pedal pulses are 2+ and equal bilaterally.  Neuro: Alert and oriented X 3.  Psych:  Responds to  questions appropriately with a normal affect.  ECG: September 14, 2012:  Normal sinus rhythm. Low-voltage QRS. NS T wave abnormality.  There are no changes from her previous tracing.  Assessment / Plan:

## 2012-09-14 NOTE — Assessment & Plan Note (Signed)
[name] is doing well.  He is not having any symptoms of CHF.  I have once again encouraged her to stay on a good diet, exercise and weight loss program.  [name] continue with her same medications. We'll draw a basic metabolic profile today since we had been managing her CHF. Her medical Dr. will continue to try her lipids and manage her statin.

## 2012-09-17 ENCOUNTER — Ambulatory Visit: Payer: Self-pay | Admitting: Cardiovascular Disease

## 2012-09-18 ENCOUNTER — Telehealth: Payer: Self-pay | Admitting: *Deleted

## 2012-09-18 DIAGNOSIS — E876 Hypokalemia: Secondary | ICD-10-CM

## 2012-09-18 MED ORDER — POTASSIUM CHLORIDE CRYS ER 10 MEQ PO TBCR: 10.0000 meq | EXTENDED_RELEASE_TABLET | Freq: Two times a day (BID) | ORAL | Status: DC

## 2012-09-18 NOTE — Telephone Encounter (Signed)
Results reviewed/ med increased/ lab date given for 1 month, pt verbalized understanding.

## 2012-09-18 NOTE — Telephone Encounter (Signed)
Message copied by Antony Odea on Tue Sep 18, 2012  8:48 AM ------      Message from: Vesta Mixer      Created: Fri Sep 14, 2012  6:03 PM       Have her increase her potassium tablets to twice a day. Recheck BMP in one month. ------

## 2012-10-19 ENCOUNTER — Other Ambulatory Visit: Payer: BC Managed Care – PPO

## 2012-10-29 ENCOUNTER — Other Ambulatory Visit: Payer: Self-pay | Admitting: Cardiovascular Disease

## 2012-10-29 NOTE — Telephone Encounter (Signed)
Fax Received. Refill Completed. [name] (R.M.A)   

## 2012-11-12 ENCOUNTER — Encounter: Payer: BC Managed Care – PPO | Admitting: *Deleted

## 2012-11-15 ENCOUNTER — Encounter: Payer: Self-pay | Admitting: *Deleted

## 2012-12-03 ENCOUNTER — Ambulatory Visit (INDEPENDENT_AMBULATORY_CARE_PROVIDER_SITE_OTHER): Payer: BC Managed Care – PPO | Admitting: *Deleted

## 2012-12-03 DIAGNOSIS — Z95810 Presence of automatic (implantable) cardiac defibrillator: Secondary | ICD-10-CM

## 2012-12-03 DIAGNOSIS — I255 Ischemic cardiomyopathy: Secondary | ICD-10-CM

## 2012-12-03 DIAGNOSIS — I2589 Other forms of chronic ischemic heart disease: Secondary | ICD-10-CM

## 2012-12-05 LAB — REMOTE ICD DEVICE
BATTERY VOLTAGE: 2.81 V
RV LEAD AMPLITUDE: 7 mv
TOT-0006: 20140523000000
TZAT-0004FASTVT: 8
TZAT-0011SLOWVT: 10 ms
TZAT-0011SLOWVT: 10 ms
TZAT-0012FASTVT: 200 ms
TZAT-0012SLOWVT: 200 ms
TZAT-0012SLOWVT: 200 ms
TZAT-0013FASTVT: 1
TZAT-0018FASTVT: NEGATIVE
TZAT-0018SLOWVT: NEGATIVE
TZAT-0018SLOWVT: NEGATIVE
TZAT-0019SLOWVT: 8 V
TZAT-0019SLOWVT: 8 V
TZAT-0020FASTVT: 1.6 ms
TZON-0003FASTVT: 240 ms
TZON-0003SLOWVT: 340 ms
TZON-0005SLOWVT: 12
TZON-0008SLOWVT: 0 ms
TZON-0010FASTVT: 30 ms
TZON-0011AFLUTTER: 70
TZST-0001FASTVT: 4
TZST-0001FASTVT: 5
TZST-0001SLOWVT: 4
TZST-0001SLOWVT: 6
TZST-0003FASTVT: 35 J
TZST-0003FASTVT: 35 J
TZST-0003SLOWVT: 25 J
TZST-0003SLOWVT: 35 J

## 2012-12-18 ENCOUNTER — Encounter: Payer: Self-pay | Admitting: *Deleted

## 2012-12-22 ENCOUNTER — Encounter: Payer: Self-pay | Admitting: Internal Medicine

## 2012-12-25 ENCOUNTER — Other Ambulatory Visit: Payer: Self-pay | Admitting: Internal Medicine

## 2013-01-07 ENCOUNTER — Other Ambulatory Visit: Payer: Self-pay | Admitting: Internal Medicine

## 2013-03-04 ENCOUNTER — Ambulatory Visit (INDEPENDENT_AMBULATORY_CARE_PROVIDER_SITE_OTHER): Payer: BC Managed Care – PPO | Admitting: *Deleted

## 2013-03-04 DIAGNOSIS — I2589 Other forms of chronic ischemic heart disease: Secondary | ICD-10-CM

## 2013-03-04 DIAGNOSIS — I255 Ischemic cardiomyopathy: Secondary | ICD-10-CM

## 2013-03-07 ENCOUNTER — Encounter: Payer: Self-pay | Admitting: Internal Medicine

## 2013-03-07 LAB — MDC_IDC_ENUM_SESS_TYPE_REMOTE
Battery Voltage: 2.76 V
Brady Statistic RV Percent Paced: 0 %
Date Time Interrogation Session: 20141218154600
HighPow Impedance: 47 Ohm
Lead Channel Setting Sensing Sensitivity: 0.3 mV
Zone Setting Detection Interval: 240 ms
Zone Setting Detection Interval: 280 ms
Zone Setting Detection Interval: 340 ms

## 2013-03-18 ENCOUNTER — Encounter: Payer: Self-pay | Admitting: *Deleted

## 2013-05-01 ENCOUNTER — Other Ambulatory Visit: Payer: Self-pay | Admitting: Cardiovascular Disease

## 2013-05-16 ENCOUNTER — Other Ambulatory Visit: Payer: Self-pay | Admitting: Internal Medicine

## 2013-06-05 ENCOUNTER — Ambulatory Visit (INDEPENDENT_AMBULATORY_CARE_PROVIDER_SITE_OTHER): Payer: BC Managed Care – PPO | Admitting: *Deleted

## 2013-06-05 DIAGNOSIS — I255 Ischemic cardiomyopathy: Secondary | ICD-10-CM

## 2013-06-05 DIAGNOSIS — I2589 Other forms of chronic ischemic heart disease: Secondary | ICD-10-CM

## 2013-06-05 LAB — MDC_IDC_ENUM_SESS_TYPE_REMOTE
Battery Voltage: 2.69 V
HighPow Impedance: 47 Ohm
Lead Channel Impedance Value: 480 Ohm
Lead Channel Sensing Intrinsic Amplitude: 7.7 mV
Lead Channel Setting Pacing Amplitude: 2 V
Lead Channel Setting Pacing Pulse Width: 0.4 ms
Lead Channel Setting Sensing Sensitivity: 0.3 mV
MDC IDC SESS DTM: 20150318122700
MDC IDC SET ZONE DETECTION INTERVAL: 240 ms
MDC IDC SET ZONE DETECTION INTERVAL: 340 ms
MDC IDC STAT BRADY RV PERCENT PACED: 0 %
Zone Setting Detection Interval: 280 ms

## 2013-06-07 ENCOUNTER — Other Ambulatory Visit: Payer: Self-pay | Admitting: Internal Medicine

## 2013-06-13 ENCOUNTER — Encounter: Payer: Self-pay | Admitting: Cardiovascular Disease

## 2013-06-13 ENCOUNTER — Ambulatory Visit (INDEPENDENT_AMBULATORY_CARE_PROVIDER_SITE_OTHER): Payer: BC Managed Care – PPO | Admitting: Cardiovascular Disease

## 2013-06-13 VITALS — BP 118/84 | HR 68 | Ht 62.0 in | Wt 248.0 lb

## 2013-06-13 DIAGNOSIS — I2510 Atherosclerotic heart disease of native coronary artery without angina pectoris: Secondary | ICD-10-CM

## 2013-06-13 DIAGNOSIS — I5022 Chronic systolic (congestive) heart failure: Secondary | ICD-10-CM

## 2013-06-13 DIAGNOSIS — E785 Hyperlipidemia, unspecified: Secondary | ICD-10-CM

## 2013-06-13 DIAGNOSIS — I509 Heart failure, unspecified: Secondary | ICD-10-CM

## 2013-06-13 NOTE — Progress Notes (Signed)
Nancy BurkeLinda Y Huber Date of Birth  09/19/1950 [facility]     [place] [facility]  [address]    [address]   [address] [place], [place]  [id number]    [place], [place]  [id number] [phone]  Fax  [phone]  [phone]  Fax [phone]  Problem list: 1. Coronary artery disease-status post chronic LAD occlusion with a large anterior wall myocardial infarction 2. Congestive heart failure-EF of 30-40% 3. AICD placement 4. Hyperlipidemia 5 obesity  History of Present Illness:  [name] is doing very well from a cardiac standpoint. She's not had any episodes of chest pain or shortness of breath. She is able to do all of her normal activities without any significant problems.  She has not had much success in losing weight  She denies any syncope or presyncope.  September 14, 2012:  [name]  is doing well. She's not having any episodes of chest pain or shortness of breath.  Her ICD has been interrogated recently and was found to be working properly.   She has no CP or dyspnea when she does her regular activities.  She does not get any regular exercise.   June 13, 2013:  [name] is doing ok.  She is exercising some.   Walks about 20 minutes a day - several days a week.   No CP or dyspnea.     Current Outpatient Prescriptions on File Prior to Visit  Medication Sig Dispense Refill  . aspirin 81 MG tablet Take 81 mg by mouth daily.        Marland Kitchen. atorvastatin (LIPITOR) 40 MG tablet Take 1 tablet (40 mg total) by mouth daily.  30 tablet  11  . carvedilol (COREG) 25 MG tablet TAKE ONE & ONE-HALF TABLETS BY MOUTH TWICE DAILY  90 tablet  5  . escitalopram (LEXAPRO) 10 MG tablet Take 10 mg by mouth daily.        . furosemide (LASIX) 40 MG tablet TAKE ONE TABLET BY MOUTH ONCE DAILY  30 tablet  3  . lisinopril (PRINIVIL,ZESTRIL) 20 MG tablet TAKE ONE TABLET BY MOUTH ONCE DAILY  30 tablet  0  . potassium chloride (KLOR-CON M10) 10 MEQ tablet Take 1 tablet (10 mEq total) by mouth 2 (two) times  daily.  60 tablet  5  . [DISCONTINUED] potassium chloride (K-DUR) 10 MEQ tablet Take 1 tablet (10 mEq total) by mouth daily.  30 tablet  11   No current facility-administered medications on file prior to visit.    Allergies  Allergen Reactions  . Codeine Nausea And Vomiting  . Ecotrin [Aspirin]     BLEEDING ULCER  . Nsaids     BLEEDING ULCER  . Sulfa Drugs Cross Reactors Nausea And Vomiting  . Penicillins Rash    Past Medical History  Diagnosis Date  . CAD (coronary artery disease)     CHRONICALLY OCCLUDED LAD WITH A LARGE ANTERIOR WALL MI   . CHF (congestive heart failure)     EF 30-40%  . ICD (implantable cardiac defibrillator), single, in situ     MEDTRONIC  . Ischemic cardiomyopathy   . Myocardial infarction     ANTERIOR WALL  . Dyslipidemia     Past Surgical History  Procedure Laterality Date  . Cardiac catheterization  01/13/05, 06/29/99  . Cardiac defibrillator placement  02/21/05    SINGLE/MEDTRONIC    History  Smoking status  . Never Smoker   Smokeless tobacco  . Never Used  History  Alcohol Use No    Family History  Problem Relation Age of Onset  . Hypertension Father   . Heart attack Mother   . Coronary artery disease Mother   . Aneurysm Brother     Reviw of Systems:  Reviewed in the HPI.  All other systems are negative.  Physical Exam: Blood pressure 118/84, pulse 68, height 5\' 2"  (1.575 m), weight 248 lb (112.492 kg). General: Well developed, well nourished, in no acute distress.  She is moderately obese.  Head: Normocephalic, atraumatic, sclera non-icteric, mucus membranes are moist,   Neck: Supple. Carotids are 2 + without bruits. No JVD  Lungs: Clear bilaterally to auscultation.  Heart: regular rate  With normal  S1 S2. No murmurs, gallops or rubs.  Abdomen: Soft, non-tender, non-distended with normal bowel sounds. No hepatomegaly. She is obese. No rebound/guarding. No masses.  Msk:  Strength and tone are normal  Extremities:  No clubbing or cyanosis. No edema.  Distal pedal pulses are 2+ and equal bilaterally.  Neuro: Alert and oriented X 3.  Psych:  Responds to questions appropriately with a normal affect.  ECG: June 13, 2013:  NSR at 24.  Low voltge, NST ST /T abn.  Assessment / Plan:

## 2013-06-13 NOTE — Assessment & Plan Note (Signed)
Nancy QuinLinda is doing fairly well. She has moderately reduced left ventricular systolic function do to an anterior wall myocardial infarction. She's basically asymptomatic. We'll continue with the same medications. Her blood pressure has been well-controlled. I've encouraged her to exercise and to work on a good weight loss program. I'll see her again in one year for followup visit.

## 2013-06-13 NOTE — Patient Instructions (Signed)
Your physician wants you to follow-up in: 1 year  You will receive a reminder letter in the mail two months in advance. If you don't receive a letter, please call our office to schedule the follow-up appointment.  Your physician recommends that you continue on your current medications as directed. Please refer to the Current Medication list given to you today.  

## 2013-06-17 NOTE — Progress Notes (Signed)
ICD remote 

## 2013-06-19 ENCOUNTER — Encounter: Payer: Self-pay | Admitting: *Deleted

## 2013-06-27 ENCOUNTER — Encounter: Payer: Self-pay | Admitting: Internal Medicine

## 2013-06-28 ENCOUNTER — Other Ambulatory Visit: Payer: Self-pay | Admitting: Internal Medicine

## 2013-07-06 ENCOUNTER — Other Ambulatory Visit: Payer: Self-pay | Admitting: Internal Medicine

## 2013-07-08 ENCOUNTER — Other Ambulatory Visit: Payer: Self-pay

## 2013-08-03 ENCOUNTER — Other Ambulatory Visit: Payer: Self-pay | Admitting: Cardiovascular Disease

## 2013-08-03 ENCOUNTER — Other Ambulatory Visit: Payer: Self-pay | Admitting: Internal Medicine

## 2013-08-28 ENCOUNTER — Encounter: Payer: Self-pay | Admitting: Cardiovascular Disease

## 2013-09-01 ENCOUNTER — Other Ambulatory Visit: Payer: Self-pay | Admitting: Cardiovascular Disease

## 2013-09-09 ENCOUNTER — Other Ambulatory Visit: Payer: Self-pay | Admitting: Internal Medicine

## 2013-09-10 ENCOUNTER — Encounter: Payer: Self-pay | Admitting: Internal Medicine

## 2013-09-10 ENCOUNTER — Ambulatory Visit (INDEPENDENT_AMBULATORY_CARE_PROVIDER_SITE_OTHER): Payer: BC Managed Care – PPO | Admitting: Internal Medicine

## 2013-09-10 ENCOUNTER — Ambulatory Visit (INDEPENDENT_AMBULATORY_CARE_PROVIDER_SITE_OTHER): Payer: BC Managed Care – PPO | Admitting: Pharmacist

## 2013-09-10 VITALS — BP 129/73 | HR 68 | Ht 62.0 in | Wt 249.0 lb

## 2013-09-10 VITALS — Wt 249.0 lb

## 2013-09-10 DIAGNOSIS — I509 Heart failure, unspecified: Secondary | ICD-10-CM

## 2013-09-10 DIAGNOSIS — Z79899 Other long term (current) drug therapy: Secondary | ICD-10-CM

## 2013-09-10 DIAGNOSIS — Z95810 Presence of automatic (implantable) cardiac defibrillator: Secondary | ICD-10-CM

## 2013-09-10 DIAGNOSIS — I2589 Other forms of chronic ischemic heart disease: Secondary | ICD-10-CM

## 2013-09-10 DIAGNOSIS — E785 Hyperlipidemia, unspecified: Secondary | ICD-10-CM

## 2013-09-10 DIAGNOSIS — I255 Ischemic cardiomyopathy: Secondary | ICD-10-CM

## 2013-09-10 DIAGNOSIS — I5022 Chronic systolic (congestive) heart failure: Secondary | ICD-10-CM

## 2013-09-10 LAB — MDC_IDC_ENUM_SESS_TYPE_INCLINIC
Battery Voltage: 2.66 V
Brady Statistic RV Percent Paced: 0 %
HIGH POWER IMPEDANCE MEASURED VALUE: 47 Ohm
HIGH POWER IMPEDANCE MEASURED VALUE: 49 Ohm
HIGH POWER IMPEDANCE MEASURED VALUE: 50 Ohm
HIGH POWER IMPEDANCE MEASURED VALUE: 51 Ohm
HIGH POWER IMPEDANCE MEASURED VALUE: 51 Ohm
HIGH POWER IMPEDANCE MEASURED VALUE: 52 Ohm
HIGH POWER IMPEDANCE MEASURED VALUE: 59 Ohm
HIGH POWER IMPEDANCE MEASURED VALUE: 60 Ohm
HIGH POWER IMPEDANCE MEASURED VALUE: 61 Ohm
HighPow Impedance: 50 Ohm
HighPow Impedance: 50 Ohm
HighPow Impedance: 51 Ohm
HighPow Impedance: 51 Ohm
HighPow Impedance: 51 Ohm
HighPow Impedance: 52 Ohm
HighPow Impedance: 52 Ohm
HighPow Impedance: 52 Ohm
HighPow Impedance: 55 Ohm
HighPow Impedance: 55 Ohm
HighPow Impedance: 59 Ohm
HighPow Impedance: 59 Ohm
HighPow Impedance: 59 Ohm
HighPow Impedance: 59 Ohm
HighPow Impedance: 60 Ohm
HighPow Impedance: 60 Ohm
HighPow Impedance: 60 Ohm
HighPow Impedance: 60 Ohm
HighPow Impedance: 63 Ohm
HighPow Impedance: 64 Ohm
HighPow Impedance: 64 Ohm
HighPow Impedance: 72 Ohm
Lead Channel Impedance Value: 440 Ohm
Lead Channel Impedance Value: 464 Ohm
Lead Channel Impedance Value: 472 Ohm
Lead Channel Impedance Value: 480 Ohm
Lead Channel Impedance Value: 480 Ohm
Lead Channel Impedance Value: 488 Ohm
Lead Channel Impedance Value: 488 Ohm
Lead Channel Sensing Intrinsic Amplitude: 10.5 mV
Lead Channel Sensing Intrinsic Amplitude: 6.3 mV
Lead Channel Sensing Intrinsic Amplitude: 6.3 mV
Lead Channel Sensing Intrinsic Amplitude: 6.6 mV
Lead Channel Sensing Intrinsic Amplitude: 7.2 mV
Lead Channel Sensing Intrinsic Amplitude: 7.2 mV
Lead Channel Sensing Intrinsic Amplitude: 7.7 mV
Lead Channel Sensing Intrinsic Amplitude: 7.7 mV
Lead Channel Sensing Intrinsic Amplitude: 8 mV
Lead Channel Sensing Intrinsic Amplitude: 9.8 mV
Lead Channel Setting Pacing Amplitude: 2 V
Lead Channel Setting Pacing Pulse Width: 0.4 ms
MDC IDC MSMT LEADCHNL RV IMPEDANCE VALUE: 440 Ohm
MDC IDC MSMT LEADCHNL RV IMPEDANCE VALUE: 456 Ohm
MDC IDC MSMT LEADCHNL RV IMPEDANCE VALUE: 464 Ohm
MDC IDC MSMT LEADCHNL RV IMPEDANCE VALUE: 464 Ohm
MDC IDC MSMT LEADCHNL RV IMPEDANCE VALUE: 464 Ohm
MDC IDC MSMT LEADCHNL RV IMPEDANCE VALUE: 472 Ohm
MDC IDC MSMT LEADCHNL RV IMPEDANCE VALUE: 472 Ohm
MDC IDC MSMT LEADCHNL RV IMPEDANCE VALUE: 480 Ohm
MDC IDC MSMT LEADCHNL RV PACING THRESHOLD AMPLITUDE: 1 V
MDC IDC MSMT LEADCHNL RV PACING THRESHOLD PULSEWIDTH: 0.2 ms
MDC IDC MSMT LEADCHNL RV SENSING INTR AMPL: 10.9 mV
MDC IDC MSMT LEADCHNL RV SENSING INTR AMPL: 6.4 mV
MDC IDC MSMT LEADCHNL RV SENSING INTR AMPL: 6.6 mV
MDC IDC MSMT LEADCHNL RV SENSING INTR AMPL: 9.2 mV
MDC IDC MSMT LEADCHNL RV SENSING INTR AMPL: 9.8 mV
MDC IDC SESS DTM: 20150623131440
MDC IDC SET LEADCHNL RV SENSING SENSITIVITY: 0.3 mV
MDC IDC SET ZONE DETECTION INTERVAL: 340 ms
Zone Setting Detection Interval: 240 ms
Zone Setting Detection Interval: 280 ms

## 2013-09-10 MED ORDER — PRAVASTATIN SODIUM 10 MG PO TABS: 10.0000 mg | ORAL_TABLET | Freq: Every day | ORAL | Status: DC

## 2013-09-10 MED ORDER — VITAMIN D 50 MCG (2000 UT) PO CAPS: 2000.0000 [IU] | ORAL_CAPSULE | Freq: Every day | ORAL | Status: DC

## 2013-09-10 NOTE — Progress Notes (Signed)
HPI [name] returns today for followup. She is a very pleasant 63 year old woman with an ischemic cardiomyopathy, status post anterior MI, with a chronically occluded LAD. In the interim, she has been stable. She denies chest pain, shortness of breath, or peripheral edema. She has had no ICD shocks. Her heart failure symptoms are class II. The patient is bothered most by her inability to lose weight. She states that she is less active than she was as she is still working and also cleans her own house. No ICD shocks. Allergies  Allergen Reactions  . Codeine Nausea And Vomiting  . Ecotrin [Aspirin]     BLEEDING ULCER  . Nsaids     BLEEDING ULCER  . Sulfa Drugs Cross Reactors Nausea And Vomiting  . Penicillins Rash     Current Outpatient Prescriptions  Medication Sig Dispense Refill  . aspirin 81 MG tablet Take 81 mg by mouth daily.        . carvedilol (COREG) 25 MG tablet TAKE ONE & ONE-HALF TABLETS BY MOUTH TWICE DAILY  90 tablet  6  . citalopram (CELEXA) 40 MG tablet Take 1 tablet by mouth daily.      . furosemide (LASIX) 40 MG tablet TAKE ONE TABLET BY MOUTH ONCE DAILY  30 tablet  5  . KLOR-CON M10 10 MEQ tablet TAKE ONE TABLET BY MOUTH TWICE DAILY  60 tablet  0  . lisinopril (PRINIVIL,ZESTRIL) 20 MG tablet TAKE ONE TABLET BY MOUTH ONCE DAILY  30 tablet  0  . [DISCONTINUED] potassium chloride (K-DUR) 10 MEQ tablet Take 1 tablet (10 mEq total) by mouth daily.  30 tablet  11   No current facility-administered medications for this visit.     Past Medical History  Diagnosis Date  . CAD (coronary artery disease)     CHRONICALLY OCCLUDED LAD WITH A LARGE ANTERIOR WALL MI   . CHF (congestive heart failure)     EF 30-40%  . ICD (implantable cardiac defibrillator), single, in situ     MEDTRONIC  . Ischemic cardiomyopathy   . Myocardial infarction     ANTERIOR WALL  . Dyslipidemia     ROS:   All systems reviewed and negative except as noted in the HPI.   Past Surgical History   Procedure Laterality Date  . Cardiac catheterization  01/13/05, 06/29/99  . Cardiac defibrillator placement  02/21/05    SINGLE/MEDTRONIC     Family History  Problem Relation Age of Onset  . Hypertension Father   . Heart attack Mother   . Coronary artery disease Mother   . Aneurysm Brother      History   Social History  . Marital Status: Married    Spouse Name: N/A    Number of Children: N/A  . Years of Education: N/A   Occupational History  . Not on file.   Social History Main Topics  . Smoking status: Never Smoker   . Smokeless tobacco: Never Used  . Alcohol Use: No  . Drug Use: Not on file  . Sexual Activity: Not on file   Other Topics Concern  . Not on file   Social History Narrative  . No narrative on file     BP 129/73  Pulse 68  Ht 5\' 2"  (1.575 m)  Wt 249 lb (112.946 kg)  BMI 45.53 kg/m2  Physical Exam:  Well appearing obese, middle-age woman,NAD HEENT: Unremarkable Neck:  6 cm JVD, no thyromegally Back:  No CVA tenderness Lungs:  Clear with no  wheezes, rales, or rhonchi. HEART:  Regular rate rhythm, no murmurs, no rubs, no clicks Abd:  soft, positive bowel sounds, no organomegally, no rebound, no guarding Ext:  2 plus pulses, no edema, no cyanosis, no clubbing Skin:  No rashes no nodules Neuro:  CN II through XII intact, motor grossly intact   DEVICE  Normal device function.  See PaceArt for details.   Assess/Plan:

## 2013-09-10 NOTE — Assessment & Plan Note (Signed)
Her Medtronic ICD is working normally. Will recheck in several months. 

## 2013-09-10 NOTE — Patient Instructions (Signed)

## 2013-09-10 NOTE — Progress Notes (Signed)
Patient is a pleasant 63 y.o. WF referred to lipid clinic by Dr. Elease HashimotoNahser given LDL of > 160 mg/dL and h/o MI.  Patient had an MI in 2001 and most recent lipid panel from PCP showed an LDL of 166 mg/dL in 3/08656/2015.  Patient was suppose to be on lipitor 40 mg qd, however she tells me she had been off this due to severe muscle aches all over her body.  These started 1 week after starting atorvastatin.  Muscle aches also occurred with Crestor and simvastatin in the past.  Of note, she had a low Vitamin D level as well (25.6) which could have contributed to the muscle aches while on statin.  She is taking vitamin D 1,000 units most days of the week currently.  Patient states she has never tried Zetia or pravastatin when asked.  She tells me cost of medications can be an issue, so needs generics if at all possible.  RF:  CAD (h/o MI), age, obesity - LDL goal < 70 mg/dL preferably, or at least < 80 mg/dL (given baseline LDL ~ 784165 mg/dL) Meds:  Not on lipid  Lowering meds currently. Intolerant:  Lipitor, Zocor, Crestor all caused severe muscle aches.  Diet:  Patient eats out often for lunch, getting a salad and french fries.  Dinner is typically chicken and vegetables, but she often fries her vegetables.  Eats lots of desserts (i.e. Icecream) and drinks 1 soda per day.  She eats yogurt and fruit for snack.  Breakfast is typically toast with coffee. Exercise:  None.  She does have access to [facility] [employer] with her friend, but she doesn't go.  Has no real excuse, so agrees to try and start going to the gym. Social history:  No tobacco use.  Denies alcohol use.  Labs:  08/28/13:   TC 238, LDL 166, HDL 54, TG 92, Vitamin D 25.6, LFTs normal, Glucose 91, Scr 0.85 - not on lipid lowering meds at this time  Current Outpatient Prescriptions  Medication Sig Dispense Refill  . aspirin 81 MG tablet Take 81 mg by mouth daily.        . carvedilol (COREG) 25 MG tablet TAKE ONE & ONE-HALF TABLETS BY MOUTH TWICE DAILY  90 tablet  6   . citalopram (CELEXA) 40 MG tablet Take 1 tablet by mouth daily.      . furosemide (LASIX) 40 MG tablet TAKE ONE TABLET BY MOUTH ONCE DAILY  30 tablet  5  . KLOR-CON M10 10 MEQ tablet TAKE ONE TABLET BY MOUTH TWICE DAILY  60 tablet  0  . lisinopril (PRINIVIL,ZESTRIL) 20 MG tablet TAKE ONE TABLET BY MOUTH ONCE DAILY  30 tablet  0  . Cholecalciferol (VITAMIN D) 2000 UNITS CAPS Take 1 capsule (2,000 Units total) by mouth daily.  30 capsule    . pravastatin (PRAVACHOL) 10 MG tablet Take 1 tablet (10 mg total) by mouth at bedtime.  30 tablet  5  . [DISCONTINUED] potassium chloride (K-DUR) 10 MEQ tablet Take 1 tablet (10 mEq total) by mouth daily.  30 tablet  11   No current facility-administered medications for this visit.   Allergies  Allergen Reactions  . Codeine Nausea And Vomiting  . Crestor [Rosuvastatin]     Muscle aches  . Ecotrin [Aspirin]     BLEEDING ULCER  . Lipitor [Atorvastatin]     Muscle aches  . Nsaids     BLEEDING ULCER  . Sulfa Drugs Cross Reactors Nausea And Vomiting  .  Zocor [Simvastatin]     Muscle aches  . Penicillins Rash   Family History  Problem Relation Age of Onset  . Hypertension Father   . Heart attack Mother   . Coronary artery disease Mother   . Aneurysm Brother

## 2013-09-10 NOTE — Assessment & Plan Note (Signed)
Her symptoms remain class 2A. She is encouraged to increase her physical activity, continue her current meds and maintain a low sodium diet. I have asked the patient to lose weight.

## 2013-09-10 NOTE — Assessment & Plan Note (Addendum)
Patient's low vitamin D level may be contributing to her poor tolerability of statin.  Given her h/o MI and LDL > 160 mg/dL would like to get patient on daily statin. Given her finances, won't use Livalo or Zetia at this time, but instead will try low dose pravastatin and increase her OTC vitamin D supplement.  Will reassess lipid/liver in 3 months, and titrate pravastatin if needed. She will call if she develops muscle aches at that time.  She is not a candidate for SPIRE, so would need to consider qweek crestor +/- Zetia most likely if she can't tolerate pravastatin.  We discussed cutting down on soda intake, fried foods, and french fries as she would like to lose at least 10 lbs over next 3-6 months.  [facility] also [facility] and [facility]. Plan: 1.  Increase vitamin D to 2,000 units once daily 2.  Start pravastatin 10 mg once daily in the evening. 3.  [facility] a few days a week. 4.  Limit french fries, soda, and fried foods. 5.  Recheck cholesterol and liver function in 3 months (12/02/13 - fasting labs, show up anytime after 7:30 am), see Riki RuskJeremy the following day on 12/03/13 at 11:00 am

## 2013-09-10 NOTE — Patient Instructions (Signed)
1.  Increase vitamin D to 2,000 units once daily 2.  Start pravastatin 10 mg once daily in the evening. 3.  [facility]. 4.  Limit french fries, soda, and fried foods. 5.  Recheck cholesterol and liver function in 3 months (12/02/13 - fasting labs, show up anytime after 7:30 am), see Riki RuskJeremy the following day on 12/03/13 at 11:00 am

## 2013-09-28 ENCOUNTER — Other Ambulatory Visit: Payer: Self-pay | Admitting: Cardiovascular Disease

## 2013-10-12 ENCOUNTER — Other Ambulatory Visit: Payer: Self-pay | Admitting: Internal Medicine

## 2013-10-17 ENCOUNTER — Encounter: Payer: Self-pay | Admitting: Cardiovascular Disease

## 2013-10-21 ENCOUNTER — Ambulatory Visit (INDEPENDENT_AMBULATORY_CARE_PROVIDER_SITE_OTHER): Payer: BC Managed Care – PPO | Admitting: *Deleted

## 2013-10-23 LAB — MDC_IDC_ENUM_SESS_TYPE_REMOTE
Battery Voltage: 2.65 V
Date Time Interrogation Session: 20150803124800
HighPow Impedance: 47 Ohm
Lead Channel Impedance Value: 504 Ohm
MDC IDC MSMT LEADCHNL RV SENSING INTR AMPL: 9.9 mV
MDC IDC SET LEADCHNL RV PACING AMPLITUDE: 2 V
MDC IDC SET LEADCHNL RV PACING PULSEWIDTH: 0.4 ms
MDC IDC SET LEADCHNL RV SENSING SENSITIVITY: 0.3 mV
Zone Setting Detection Interval: 240 ms
Zone Setting Detection Interval: 280 ms
Zone Setting Detection Interval: 340 ms

## 2013-10-23 NOTE — Addendum Note (Signed)
Addended by: Melvern SampleATES-LAND, Gale Klar M on: 10/23/2013 11:34 AM   Modules accepted: Level of Service

## 2013-10-23 NOTE — Progress Notes (Signed)
Remote ICD transmission.   

## 2013-10-25 ENCOUNTER — Encounter: Payer: Self-pay | Admitting: Cardiology

## 2013-11-01 ENCOUNTER — Encounter: Payer: Self-pay | Admitting: Internal Medicine

## 2013-11-21 ENCOUNTER — Ambulatory Visit (INDEPENDENT_AMBULATORY_CARE_PROVIDER_SITE_OTHER): Payer: BC Managed Care – PPO | Admitting: *Deleted

## 2013-11-21 DIAGNOSIS — I2589 Other forms of chronic ischemic heart disease: Secondary | ICD-10-CM

## 2013-11-21 DIAGNOSIS — I255 Ischemic cardiomyopathy: Secondary | ICD-10-CM

## 2013-11-21 NOTE — Progress Notes (Signed)
Remote ICD transmission.   

## 2013-12-02 ENCOUNTER — Other Ambulatory Visit (INDEPENDENT_AMBULATORY_CARE_PROVIDER_SITE_OTHER): Payer: BC Managed Care – PPO

## 2013-12-02 DIAGNOSIS — Z79899 Other long term (current) drug therapy: Secondary | ICD-10-CM

## 2013-12-02 DIAGNOSIS — E785 Hyperlipidemia, unspecified: Secondary | ICD-10-CM

## 2013-12-02 LAB — MDC_IDC_ENUM_SESS_TYPE_REMOTE
Battery Voltage: 2.65 V
Brady Statistic RV Percent Paced: 0 %
Date Time Interrogation Session: 20150903114900
HighPow Impedance: 47 Ohm
Lead Channel Impedance Value: 488 Ohm
Lead Channel Sensing Intrinsic Amplitude: 7.3 mV
Lead Channel Setting Pacing Amplitude: 2 V
Lead Channel Setting Pacing Pulse Width: 0.4 ms
Lead Channel Setting Sensing Sensitivity: 0.3 mV
Zone Setting Detection Interval: 240 ms
Zone Setting Detection Interval: 280 ms
Zone Setting Detection Interval: 340 ms

## 2013-12-02 LAB — HEPATIC FUNCTION PANEL
ALT: 17 U/L (ref 0–35)
AST: 17 U/L (ref 0–37)
Albumin: 3.8 g/dL (ref 3.5–5.2)
Alkaline Phosphatase: 52 U/L (ref 39–117)
BILIRUBIN DIRECT: 0 mg/dL (ref 0.0–0.3)
TOTAL PROTEIN: 7.1 g/dL (ref 6.0–8.3)
Total Bilirubin: 0.7 mg/dL (ref 0.2–1.2)

## 2013-12-02 LAB — LIPID PANEL
Cholesterol: 170 mg/dL (ref 0–200)
HDL: 48.8 mg/dL (ref >39.00)
LDL Cholesterol: 105 mg/dL — ABNORMAL HIGH (ref 0–99)
NonHDL: 121.2
Total CHOL/HDL Ratio: 3
Triglycerides: 79 mg/dL (ref 0.0–149.0)
VLDL: 15.8 mg/dL (ref 0.0–40.0)

## 2013-12-03 ENCOUNTER — Ambulatory Visit (INDEPENDENT_AMBULATORY_CARE_PROVIDER_SITE_OTHER): Payer: BC Managed Care – PPO | Admitting: Pharmacist

## 2013-12-03 ENCOUNTER — Other Ambulatory Visit: Payer: BC Managed Care – PPO

## 2013-12-03 VITALS — Wt 248.0 lb

## 2013-12-03 DIAGNOSIS — E785 Hyperlipidemia, unspecified: Secondary | ICD-10-CM

## 2013-12-03 MED ORDER — PRAVASTATIN SODIUM 20 MG PO TABS: 20.0000 mg | ORAL_TABLET | Freq: Every evening | ORAL | Status: DC

## 2013-12-03 NOTE — Progress Notes (Signed)
Patient is a pleasant 63 y.o. WF referred to lipid clinic by Dr. Elease Hashimoto given LDL of > 160 mg/dL and h/o MI.  She had trouble tolerating statins in the past.  I started her on pravastatin 10 mg qhs three months ago, and she is tolerating this well.  Her LDL has dropped from 166 mg/dL down to 161 mg/dL over past 3 months.  Patient had an MI in 2001 and most recent lipid panel from PCP showed an LDL of 166 mg/dL in 0/9604.   Muscle aches occurred with Crestor, lipitor, and simvastatin in the past.  Of note, she had a low Vitamin D level as well (25.6) which could have contributed to the muscle aches while on statin, so we increased her supplementation to 2,000 Units daily back in 08/2013.   Patient states she has never tried Zetia or pravastatin when asked.  She tells me cost of medications can be an issue, so needs generics if at all possible.  RF:  CAD (h/o MI), age, obesity - LDL goal < 70 mg/dL preferably, or at least < 80 mg/dL (given baseline LDL ~ 540 mg/dL) Meds:  Pravastatin 10 mg qhs Intolerant:  Lipitor, Zocor, Crestor all caused severe muscle aches.  Diet:  Patient eats out often for lunch, getting a salad and french fries.  Dinner is typically chicken and vegetables, but she often fries her vegetables.  Eats lots of desserts (i.e. Icecream) and drinks 1 soda per day.  She eats yogurt and fruit for snack.  Breakfast is typically toast with coffee.  She did stop eating red meat and drinking soda for a month and lost 4-5 lbs, however got respiratory infection and "fell off the wagon" and went back to her old eating habits.  She is going to try and cut out red meat and soda again. Exercise:  None.  She does have access to [facility] [employer] with her friend, but she doesn't go.  Has no real excuse, so agrees to try and start going to the gym. Social history:  No tobacco use.  Denies alcohol use.  Labs:  11/2013:  TC 170, LDL 105, HDL 49, TG 79, non-HDL 121, LFTs normal ( on Pravastatin 10 mg qd) 08/28/13:  TC  238, LDL 166, HDL 54, TG 92, Vitamin D 25.6, LFTs normal, Glucose 91, Scr 0.85 - not on lipid lowering meds at this time  Current Outpatient Prescriptions  Medication Sig Dispense Refill  . aspirin 81 MG tablet Take 81 mg by mouth daily.        . carvedilol (COREG) 25 MG tablet TAKE ONE & ONE-HALF TABLETS BY MOUTH TWICE DAILY  90 tablet  6  . Cholecalciferol (VITAMIN D) 2000 UNITS CAPS Take 1 capsule (2,000 Units total) by mouth daily.  30 capsule    . citalopram (CELEXA) 40 MG tablet Take 1 tablet by mouth daily.      . furosemide (LASIX) 40 MG tablet TAKE ONE TABLET BY MOUTH ONCE DAILY  30 tablet  5  . KLOR-CON M10 10 MEQ tablet TAKE ONE TABLET BY MOUTH TWICE DAILY  60 tablet  5  . lisinopril (PRINIVIL,ZESTRIL) 20 MG tablet TAKE ONE TABLET BY MOUTH ONCE DAILY  30 tablet  11  . pravastatin (PRAVACHOL) 10 MG tablet Take 1 tablet (10 mg total) by mouth at bedtime.  30 tablet  5  . [DISCONTINUED] potassium chloride (K-DUR) 10 MEQ tablet Take 1 tablet (10 mEq total) by mouth daily.  30 tablet  11  No current facility-administered medications for this visit.   Allergies  Allergen Reactions  . Codeine Nausea And Vomiting  . Crestor [Rosuvastatin]     Muscle aches  . Ecotrin [Aspirin]     BLEEDING ULCER  . Lipitor [Atorvastatin]     Muscle aches  . Nsaids     BLEEDING ULCER  . Sulfa Drugs Cross Reactors Nausea And Vomiting  . Zocor [Simvastatin]     Muscle aches  . Penicillins Rash   Family History  Problem Relation Age of Onset  . Hypertension Father   . Heart attack Mother   . Coronary artery disease Mother   . Aneurysm Brother

## 2013-12-03 NOTE — Assessment & Plan Note (Addendum)
LDL much improved from 166 mg/dL down to 952 mg/dL over past 3 months.  This is the first daily statin dose she has been able to tolerate. Would like to get LDL down to 70 mg/dL or less.  Patient agreeable to increase pravastatin to 20 mg qhs, and will just cut this in half (10 mg qd) if she develops muscle aches on the 20 mg dose.  She will try to get back to the [facility] again with her friend.  She agrees to cut out soda and red meat again and will recheck lipid / liver in 3 months.

## 2013-12-03 NOTE — Patient Instructions (Signed)
1.  Increase pravastatin to 20 mg once daily.  Take in the evening.  If you develop muscle aches, then cut it in half, and just take 10 mg daily. 2.  Stop sodas and red meat again.  Try to maintain this diet. 3.  Increase exercise / [employer] with friend. 4.  Recheck cholesterol and liver function in 3 months (03/04/14 AM - fasting labs, show up anytime after 7:30 am)  and see Kennon Rounds two days later on 03/06/14 at 11:30 am

## 2013-12-10 ENCOUNTER — Encounter: Payer: Self-pay | Admitting: Cardiology

## 2013-12-12 ENCOUNTER — Encounter: Payer: Self-pay | Admitting: Internal Medicine

## 2013-12-25 ENCOUNTER — Ambulatory Visit (INDEPENDENT_AMBULATORY_CARE_PROVIDER_SITE_OTHER): Payer: BC Managed Care – PPO | Admitting: *Deleted

## 2013-12-25 DIAGNOSIS — Z95810 Presence of automatic (implantable) cardiac defibrillator: Secondary | ICD-10-CM

## 2013-12-25 LAB — MDC_IDC_ENUM_SESS_TYPE_REMOTE
Lead Channel Impedance Value: 464 Ohm
Lead Channel Setting Pacing Amplitude: 2 V
Lead Channel Setting Pacing Pulse Width: 0.4 ms
Lead Channel Setting Sensing Sensitivity: 0.3 mV
MDC IDC MSMT BATTERY VOLTAGE: 2.65 V
MDC IDC MSMT LEADCHNL RV SENSING INTR AMPL: 7.1 mV
MDC IDC SET ZONE DETECTION INTERVAL: 280 ms
Zone Setting Detection Interval: 240 ms
Zone Setting Detection Interval: 340 ms

## 2013-12-25 NOTE — Progress Notes (Signed)
Remote ICD transmission.   

## 2014-01-10 ENCOUNTER — Encounter: Payer: Self-pay | Admitting: *Deleted

## 2014-01-24 ENCOUNTER — Encounter: Payer: Self-pay | Admitting: Internal Medicine

## 2014-01-27 ENCOUNTER — Ambulatory Visit (INDEPENDENT_AMBULATORY_CARE_PROVIDER_SITE_OTHER): Payer: BC Managed Care – PPO | Admitting: *Deleted

## 2014-01-27 DIAGNOSIS — Z95810 Presence of automatic (implantable) cardiac defibrillator: Secondary | ICD-10-CM

## 2014-01-27 NOTE — Progress Notes (Signed)
Remote ICD transmission.   

## 2014-01-30 LAB — MDC_IDC_ENUM_SESS_TYPE_REMOTE
Battery Voltage: 2.65 V
Brady Statistic RV Percent Paced: 0 %
HIGH POWER IMPEDANCE MEASURED VALUE: 47 Ohm
Lead Channel Impedance Value: 464 Ohm
Lead Channel Sensing Intrinsic Amplitude: 6.9 mV
Lead Channel Setting Pacing Amplitude: 2 V
Lead Channel Setting Pacing Pulse Width: 0.4 ms
MDC IDC SESS DTM: 20151109172600
MDC IDC SET LEADCHNL RV SENSING SENSITIVITY: 0.3 mV
MDC IDC SET ZONE DETECTION INTERVAL: 240 ms
MDC IDC SET ZONE DETECTION INTERVAL: 280 ms
Zone Setting Detection Interval: 340 ms

## 2014-02-04 ENCOUNTER — Encounter: Payer: Self-pay | Admitting: Cardiology

## 2014-02-05 ENCOUNTER — Other Ambulatory Visit: Payer: Self-pay | Admitting: Internal Medicine

## 2014-02-10 ENCOUNTER — Telehealth: Payer: Self-pay | Admitting: Internal Medicine

## 2014-02-10 NOTE — Telephone Encounter (Signed)
She is followed by Dr Elease HashimotoNahser as her primary Cardiologist.  She feels weak, no energy, both legs are hurting but left is worse than right.  No swelling or redness.  He knees ache.  She says he legs feel heavy.  She says this started last Thurs and has progressively gotten worse.  No CP.  She can not lay on left side due to knees hurting.  Has used rub and its helps but only for a bit but then starts again.  I would call her PCP.  She verbalized understanding and appreciated the call back

## 2014-02-10 NOTE — Telephone Encounter (Signed)
New MSG  Patient is having leg pain specifically by her knee and feels as if there are weights on her feets. Knee is very sore and she is afraid she has a blood clot but doesn't have warm feeling. Client worried if it has anything to do with defib. Please contact at [phone].

## 2014-02-10 NOTE — Telephone Encounter (Signed)
PT NOW REQUESTING AN APPT FOR HER LEGS AND POSS NEED DEVICE CK

## 2014-03-04 ENCOUNTER — Other Ambulatory Visit (INDEPENDENT_AMBULATORY_CARE_PROVIDER_SITE_OTHER): Payer: BC Managed Care – PPO | Admitting: *Deleted

## 2014-03-04 ENCOUNTER — Other Ambulatory Visit: Payer: Self-pay | Admitting: Cardiovascular Disease

## 2014-03-04 DIAGNOSIS — E785 Hyperlipidemia, unspecified: Secondary | ICD-10-CM

## 2014-03-04 LAB — HEPATIC FUNCTION PANEL
ALT: 14 U/L (ref 0–35)
AST: 14 U/L (ref 0–37)
Albumin: 3.9 g/dL (ref 3.5–5.2)
Alkaline Phosphatase: 43 U/L (ref 39–117)
BILIRUBIN DIRECT: 0 mg/dL (ref 0.0–0.3)
Total Bilirubin: 0.5 mg/dL (ref 0.2–1.2)
Total Protein: 6.7 g/dL (ref 6.0–8.3)

## 2014-03-04 LAB — LIPID PANEL
CHOLESTEROL: 169 mg/dL (ref 0–200)
HDL: 41.2 mg/dL (ref >39.00)
LDL Cholesterol: 92 mg/dL (ref 0–99)
NonHDL: 127.8
Total CHOL/HDL Ratio: 4
Triglycerides: 181 mg/dL — ABNORMAL HIGH (ref 0.0–149.0)
VLDL: 36.2 mg/dL (ref 0.0–40.0)

## 2014-03-06 ENCOUNTER — Ambulatory Visit: Payer: BC Managed Care – PPO | Admitting: Pharmacist

## 2014-03-25 ENCOUNTER — Ambulatory Visit: Payer: BC Managed Care – PPO | Admitting: Pharmacist

## 2014-04-02 ENCOUNTER — Ambulatory Visit (INDEPENDENT_AMBULATORY_CARE_PROVIDER_SITE_OTHER): Payer: BLUE CROSS/BLUE SHIELD | Admitting: Pharmacist

## 2014-04-02 DIAGNOSIS — E785 Hyperlipidemia, unspecified: Secondary | ICD-10-CM

## 2014-04-02 MED ORDER — EZETIMIBE 10 MG PO TABS: 10.0000 mg | ORAL_TABLET | Freq: Every day | ORAL | Status: DC

## 2014-04-02 NOTE — Progress Notes (Signed)
Patient is a pleasant 64 y.o. WF referred to lipid clinic by Dr. Elease HashimotoNahser given LDL of > 160 mg/dL and h/o MI.  She had trouble tolerating statins in the past. She was started on pravastatin 10 mg qhs last June.  Her LDL has dropped from 166 mg/dL down to 630105 mg/dL and so in September this was increased to 20mg  daily.  She has tolerated this dose with no problems.    Muscle aches occurred with Crestor, lipitor, and simvastatin in the past.  Of note, she had a low Vitamin D level as well (25.6) which could have contributed to the muscle aches while on statin, so we increased her supplementation to 2,000 Units daily back in 08/2013.   Patient states she has never tried Zetia or pravastatin when asked.  She tells me cost of medications can be an issue, so needs generics if at all possible.  RF:  CAD (h/o MI), age, obesity - LDL goal < 70 mg/dL preferably, or at least < 80 mg/dL (given baseline LDL ~ 160165 mg/dL) Meds:  Pravastatin 20 mg qhs Intolerant:  Lipitor, Zocor, Crestor all caused severe muscle aches.  Diet/Exercise: Pt's brother died in December so she has been dealing with this matter and has not been watching her diet or exercise routine.   Labs:  02/2014: TC 169, LDL 92, HDL 41, TG 181, non-HDL 128, LFTs normal (on Pravastatin 20mg  daily) 11/2013:  TC 170, LDL 105, HDL 49, TG 79, non-HDL 121, LFTs normal ( on Pravastatin 10 mg qd) 08/28/13:  TC 238, LDL 166, HDL 54, TG 92, Vitamin D 25.6, LFTs normal, Glucose 91, Scr 0.85 - not on lipid lowering meds at this time  Current Outpatient Prescriptions  Medication Sig Dispense Refill  . aspirin 81 MG tablet Take 81 mg by mouth daily.      . carvedilol (COREG) 25 MG tablet TAKE ONE & ONE-HALF TABLETS BY MOUTH TWICE DAILY 90 tablet 10  . Cholecalciferol (VITAMIN D) 2000 UNITS CAPS Take 1 capsule (2,000 Units total) by mouth daily. 30 capsule   . citalopram (CELEXA) 40 MG tablet Take 1 tablet by mouth daily.    Marland Kitchen. ezetimibe (ZETIA) 10 MG tablet Take 1  tablet (10 mg total) by mouth daily. 30 tablet 6  . furosemide (LASIX) 40 MG tablet TAKE ONE TABLET BY MOUTH ONCE DAILY 30 tablet 2  . KLOR-CON M10 10 MEQ tablet TAKE ONE TABLET BY MOUTH TWICE DAILY 60 tablet 5  . lisinopril (PRINIVIL,ZESTRIL) 20 MG tablet TAKE ONE TABLET BY MOUTH ONCE DAILY 30 tablet 11  . pravastatin (PRAVACHOL) 20 MG tablet Take 1 tablet (20 mg total) by mouth every evening. 30 tablet 5  . [DISCONTINUED] potassium chloride (K-DUR) 10 MEQ tablet Take 1 tablet (10 mEq total) by mouth daily. 30 tablet 11   No current facility-administered medications for this visit.   Allergies  Allergen Reactions  . Codeine Nausea And Vomiting  . Crestor [Rosuvastatin]     Muscle aches  . Ecotrin [Aspirin]     BLEEDING ULCER  . Lipitor [Atorvastatin]     Muscle aches  . Nsaids     BLEEDING ULCER  . Sulfa Drugs Cross Reactors Nausea And Vomiting  . Zocor [Simvastatin]     Muscle aches  . Penicillins Rash   Family History  Problem Relation Age of Onset  . Hypertension Father   . Heart attack Mother   . Coronary artery disease Mother   . Aneurysm Brother

## 2014-04-02 NOTE — Assessment & Plan Note (Signed)
Pt's LDL decreased some with increase of pravastatin 10mg  to 20mg  but still above goal of <70 or a 50% reduction from baseline.  Discussed increase in pravastatin dose versus addition of Zetia.  Pt more likely to get to goal with addition of Zetia.  She is still on private insurance so able to use a copay card to help with the cost.  Will start her on Zetia 10mg  daily and recheck labs in 3 months.  Pt lives in [place] so will call her with the results rather than make an appt.

## 2014-04-02 NOTE — Patient Instructions (Signed)
Continue your pravastatin 20mg  once daily.   We will add Zetia 10mg  daily.  If you have any problems, please call Kennon RoundsSally at [phone]  Recheck labs in 3 months.

## 2014-04-30 ENCOUNTER — Ambulatory Visit (INDEPENDENT_AMBULATORY_CARE_PROVIDER_SITE_OTHER): Payer: BLUE CROSS/BLUE SHIELD | Admitting: *Deleted

## 2014-04-30 DIAGNOSIS — I255 Ischemic cardiomyopathy: Secondary | ICD-10-CM

## 2014-04-30 LAB — MDC_IDC_ENUM_SESS_TYPE_REMOTE
Battery Voltage: 2.64 V
Date Time Interrogation Session: 20160210144600
HIGH POWER IMPEDANCE MEASURED VALUE: 47 Ohm
MDC IDC MSMT LEADCHNL RV IMPEDANCE VALUE: 440 Ohm
MDC IDC MSMT LEADCHNL RV SENSING INTR AMPL: 8.1 mV
MDC IDC SET LEADCHNL RV PACING AMPLITUDE: 2 V
MDC IDC SET LEADCHNL RV PACING PULSEWIDTH: 0.4 ms
MDC IDC SET LEADCHNL RV SENSING SENSITIVITY: 0.3 mV
MDC IDC STAT BRADY RV PERCENT PACED: 0 %
Zone Setting Detection Interval: 240 ms
Zone Setting Detection Interval: 280 ms
Zone Setting Detection Interval: 340 ms

## 2014-04-30 NOTE — Progress Notes (Signed)
Remote ICD transmission.   

## 2014-05-08 ENCOUNTER — Encounter: Payer: Self-pay | Admitting: Cardiology

## 2014-05-13 ENCOUNTER — Encounter: Payer: Self-pay | Admitting: Internal Medicine

## 2014-05-15 ENCOUNTER — Encounter: Payer: Self-pay | Admitting: Cardiovascular Disease

## 2014-05-15 ENCOUNTER — Ambulatory Visit (INDEPENDENT_AMBULATORY_CARE_PROVIDER_SITE_OTHER): Payer: BLUE CROSS/BLUE SHIELD | Admitting: Cardiovascular Disease

## 2014-05-15 VITALS — BP 116/76 | HR 71 | Ht 62.0 in | Wt 248.4 lb

## 2014-05-15 DIAGNOSIS — Z95810 Presence of automatic (implantable) cardiac defibrillator: Secondary | ICD-10-CM

## 2014-05-15 DIAGNOSIS — E785 Hyperlipidemia, unspecified: Secondary | ICD-10-CM

## 2014-05-15 DIAGNOSIS — I2510 Atherosclerotic heart disease of native coronary artery without angina pectoris: Secondary | ICD-10-CM

## 2014-05-15 DIAGNOSIS — I5022 Chronic systolic (congestive) heart failure: Secondary | ICD-10-CM

## 2014-05-15 MED ORDER — FUROSEMIDE 40 MG PO TABS: 40.0000 mg | ORAL_TABLET | Freq: Every day | ORAL | Status: DC

## 2014-05-15 NOTE — Patient Instructions (Signed)

## 2014-05-15 NOTE — Progress Notes (Signed)
Cardiology Office Note   Date:  05/15/2014   ID:  Nancy BurkeLinda Y Huber, DOB 08/09/1950, MRN [id number]  PCP:  Deloris PingYTER-BROWN,SHERRY M, MD  Cardiologist:   Vesta MixerNahser, Yitzel Shasteen J, MD   Chief Complaint  Patient presents with  . Coronary Artery Disease   1. Coronary artery disease-status post chronic LAD occlusion with a large anterior wall myocardial infarction 2. Congestive heart failure-EF of 30-40% 3. AICD placement 4. Hyperlipidemia 5 obesity  History of Present Illness:  Nancy QuinLinda is doing very well from a cardiac standpoint. She's not had any episodes of chest pain or shortness of breath. She is able to do all of her normal activities without any significant problems. She has not had much success in losing weight  She denies any syncope or presyncope.  September 14, 2012:  Nancy QuinLinda is doing well. She's not having any episodes of chest pain or shortness of breath. Her ICD has been interrogated recently and was found to be working properly. She has no CP or dyspnea when she does her regular activities. She does not get any regular exercise.   June 13, 2013:  Nancy QuinLinda is doing ok. She is exercising some. Walks about 20 minutes a day - several days a week. No CP or dyspnea.    Feb. 25, 2016:  Nancy BurkeLinda Y Huber is a 64 y.o. female who presents for follow up of her CAD and CHF.   Her ICD is working well.  She will need to have the battery checked more often recently because  it is nearing ERI.  She is not getting exercise.   Denies any chest pain  Or dyspnea.   Needs to lose weight.  [facility]    Past Medical History  Diagnosis Date  . CAD (coronary artery disease)     CHRONICALLY OCCLUDED LAD WITH A LARGE ANTERIOR WALL MI   . CHF (congestive heart failure)     EF 30-40%  . ICD (implantable cardiac defibrillator), single, in situ     [employer]  . Ischemic cardiomyopathy   . Myocardial infarction     ANTERIOR WALL  . Dyslipidemia     Past Surgical History    Procedure Laterality Date  . Cardiac catheterization  01/13/05, 06/29/99  . Cardiac defibrillator placement  02/21/05    SINGLE/[employer]TRONIC     Current Outpatient Prescriptions  Medication Sig Dispense Refill  . aspirin 81 MG tablet Take 81 mg by mouth daily.      . carvedilol (COREG) 25 MG tablet TAKE ONE & ONE-HALF TABLETS BY MOUTH TWICE DAILY 90 tablet 10  . Cholecalciferol (VITAMIN D) 2000 UNITS CAPS Take 1 capsule (2,000 Units total) by mouth daily. 30 capsule   . citalopram (CELEXA) 40 MG tablet Take 1 tablet by mouth daily.    Marland Kitchen. ezetimibe (ZETIA) 10 MG tablet Take 1 tablet (10 mg total) by mouth daily. 30 tablet 6  . furosemide (LASIX) 40 MG tablet TAKE ONE TABLET BY MOUTH ONCE DAILY 30 tablet 2  . KLOR-CON M10 10 MEQ tablet TAKE ONE TABLET BY MOUTH TWICE DAILY 60 tablet 5  . lisinopril (PRINIVIL,ZESTRIL) 20 MG tablet TAKE ONE TABLET BY MOUTH ONCE DAILY 30 tablet 11  . pravastatin (PRAVACHOL) 20 MG tablet Take 1 tablet (20 mg total) by mouth every evening. 30 tablet 5  . [DISCONTINUED] potassium chloride (K-DUR) 10 MEQ tablet Take 1 tablet (10 mEq total) by mouth daily. 30 tablet 11   No current facility-administered medications  for this visit.    Allergies:   Codeine; Crestor; Ecotrin; Lipitor; Nsaids; Sulfa drugs cross reactors; Zocor; and Penicillins    Social History:  The patient  reports that she has never smoked. She has never used smokeless tobacco. She reports that she does not drink alcohol.   Family History:  The patient's family history includes Aneurysm in her brother; Coronary artery disease in her mother; Heart attack in her mother; Hypertension in her father.    ROS:  Please see the history of present illness.    Review of Systems: Constitutional:  denies fever, chills, diaphoresis, appetite change and fatigue.  HEENT: denies photophobia, eye pain, redness, hearing loss, ear pain, congestion, sore throat, rhinorrhea, sneezing, neck pain, neck stiffness and  tinnitus.  Respiratory: denies SOB, DOE, cough, chest tightness, and wheezing.  Cardiovascular: denies chest pain, palpitations and leg swelling.  Gastrointestinal: denies nausea, vomiting, abdominal pain, diarrhea, constipation, blood in stool.  Genitourinary: denies dysuria, urgency, frequency, hematuria, flank pain and difficulty urinating.  Musculoskeletal: denies  myalgias, back pain, joint swelling, arthralgias and gait problem.   Skin: denies pallor, rash and wound.  Neurological: denies dizziness, seizures, syncope, weakness, light-headedness, numbness and headaches.   Hematological: denies adenopathy, easy bruising, personal or family bleeding history.  Psychiatric/ Behavioral: denies suicidal ideation, mood changes, confusion, nervousness, sleep disturbance and agitation.       All other systems are reviewed and negative.    PHYSICAL EXAM: VS:  BP 116/76 mmHg  Pulse 71  Ht  (1.575 m)  Wt 248 lb 6.4 oz (112.674 kg)  BMI 45.42 kg/m2 , BMI Body mass index is 45.42 kg/(m^2). GEN: Well nourished, well developed, in no acute distress HEENT: normal Neck: no JVD, carotid bruits, or masses Cardiac: RRR; no murmurs, rubs, or gallops,no edema  Respiratory:  clear to auscultation bilaterally, normal work of breathing GI: soft, nontender, nondistended, + BS MS: no deformity or atrophy Skin: warm and dry, no rash Neuro:  Strength and sensation are intact Psych: normal   EKG:  EKG is ordered today. The ekg ordered today demonstrates NSR at 71. NS ST abn.    Recent Labs: 03/04/2014: ALT 14    Lipid Panel    Component Value Date/Time   CHOL 169 03/04/2014 1036   TRIG 181.0* 03/04/2014 1036   HDL 41.20 03/04/2014 1036   CHOLHDL 4 03/04/2014 1036   VLDL 36.2 03/04/2014 1036   LDLCALC 92 03/04/2014 1036      Wt Readings from Last 3 Encounters:  05/15/14 248 lb 6.4 oz (112.674 kg)  12/03/13 248 lb (112.492 kg)  09/10/13 249 lb (112.946 kg)      Other studies  Reviewed: Additional studies/ records that were reviewed today include: . Review of the above records demonstrates:    ASSESSMENT AND PLAN:  1. Coronary artery disease-status post chronic LAD occlusion with a large anterior wall myocardial infarction- she is doing well.  No CP .  No worsening of her dyspnea.  Continue current meds.   2. Congestive heart failure-EF of 30-40% - stable. Continue current meds.  3. AICD placement - follow up with Dr. Ladona Ridgel   4. Hyperlipidemia - followed in lipid clinic  5 obesity - encouraged her to lose some weight   Current medicines are reviewed at length with the patient today.  The patient does not have concerns regarding medicines.  The following changes have been made:  no change   Disposition:   FU with 6 months  Signed, Veronica Guerrant, Deloris Ping, MD  05/15/2014 12:00 PM    [facility] [address] [place], [place], [place]  [address] Phone: [phone]; Fax: [phone]

## 2014-05-29 ENCOUNTER — Telehealth: Payer: Self-pay | Admitting: Cardiology

## 2014-05-29 ENCOUNTER — Ambulatory Visit (INDEPENDENT_AMBULATORY_CARE_PROVIDER_SITE_OTHER): Payer: BLUE CROSS/BLUE SHIELD | Admitting: *Deleted

## 2014-05-29 DIAGNOSIS — Z95810 Presence of automatic (implantable) cardiac defibrillator: Secondary | ICD-10-CM

## 2014-05-29 NOTE — Telephone Encounter (Signed)
LMOVM reminding pt to send remote transmission.   

## 2014-05-30 LAB — MDC_IDC_ENUM_SESS_TYPE_REMOTE
Date Time Interrogation Session: 20160311012800
HighPow Impedance: 47 Ohm
Lead Channel Impedance Value: 424 Ohm
Lead Channel Sensing Intrinsic Amplitude: 6.2 mV
Lead Channel Setting Pacing Amplitude: 2 V
Lead Channel Setting Pacing Pulse Width: 0.4 ms
Lead Channel Setting Sensing Sensitivity: 0.3 mV
MDC IDC MSMT BATTERY VOLTAGE: 2.64 V
MDC IDC SET ZONE DETECTION INTERVAL: 240 ms
MDC IDC SET ZONE DETECTION INTERVAL: 280 ms
MDC IDC SET ZONE DETECTION INTERVAL: 340 ms
MDC IDC STAT BRADY RV PERCENT PACED: 0 %

## 2014-05-30 NOTE — Progress Notes (Signed)
Remote ICD transmission.   

## 2014-06-05 ENCOUNTER — Encounter: Payer: Self-pay | Admitting: Cardiology

## 2014-06-11 ENCOUNTER — Encounter: Payer: Self-pay | Admitting: Internal Medicine

## 2014-06-30 ENCOUNTER — Telehealth: Payer: Self-pay | Admitting: Cardiology

## 2014-06-30 ENCOUNTER — Ambulatory Visit (INDEPENDENT_AMBULATORY_CARE_PROVIDER_SITE_OTHER): Payer: BLUE CROSS/BLUE SHIELD | Admitting: *Deleted

## 2014-06-30 DIAGNOSIS — Z95810 Presence of automatic (implantable) cardiac defibrillator: Secondary | ICD-10-CM

## 2014-06-30 NOTE — Telephone Encounter (Signed)
LMOVM reminding pt to send remote transmission.   

## 2014-07-01 NOTE — Progress Notes (Signed)
Remote ICD transmission.   

## 2014-07-02 ENCOUNTER — Other Ambulatory Visit: Payer: BLUE CROSS/BLUE SHIELD

## 2014-07-04 LAB — MDC_IDC_ENUM_SESS_TYPE_REMOTE
Battery Voltage: 2.64 V
Brady Statistic RV Percent Paced: 0 %
Date Time Interrogation Session: 20160411214700
HighPow Impedance: 47 Ohm
Lead Channel Impedance Value: 440 Ohm
Lead Channel Setting Sensing Sensitivity: 0.3 mV
MDC IDC MSMT LEADCHNL RV SENSING INTR AMPL: 8 mV
MDC IDC SET LEADCHNL RV PACING AMPLITUDE: 2 V
MDC IDC SET LEADCHNL RV PACING PULSEWIDTH: 0.4 ms
Zone Setting Detection Interval: 240 ms
Zone Setting Detection Interval: 280 ms
Zone Setting Detection Interval: 340 ms

## 2014-07-14 ENCOUNTER — Encounter: Payer: Self-pay | Admitting: Cardiology

## 2014-07-17 ENCOUNTER — Encounter: Payer: Self-pay | Admitting: Internal Medicine

## 2014-07-31 ENCOUNTER — Telehealth: Payer: Self-pay | Admitting: Cardiology

## 2014-07-31 ENCOUNTER — Ambulatory Visit (INDEPENDENT_AMBULATORY_CARE_PROVIDER_SITE_OTHER): Payer: BLUE CROSS/BLUE SHIELD | Admitting: *Deleted

## 2014-07-31 DIAGNOSIS — Z95810 Presence of automatic (implantable) cardiac defibrillator: Secondary | ICD-10-CM

## 2014-07-31 NOTE — Progress Notes (Signed)
Remote ICD transmission.   

## 2014-07-31 NOTE — Telephone Encounter (Signed)
LMOVM reminding pt to send remote transmission.   

## 2014-08-01 LAB — CUP PACEART REMOTE DEVICE CHECK
Brady Statistic RV Percent Paced: 0 %
Date Time Interrogation Session: 20160512154700
HighPow Impedance: 47 Ohm
Lead Channel Impedance Value: 416 Ohm
Lead Channel Sensing Intrinsic Amplitude: 4.7 mV
Lead Channel Setting Sensing Sensitivity: 0.3 mV
MDC IDC MSMT BATTERY VOLTAGE: 2.64 V
MDC IDC SET LEADCHNL RV PACING AMPLITUDE: 2 V
MDC IDC SET LEADCHNL RV PACING PULSEWIDTH: 0.4 ms
MDC IDC SET ZONE DETECTION INTERVAL: 280 ms
Zone Setting Detection Interval: 240 ms
Zone Setting Detection Interval: 340 ms

## 2014-08-08 ENCOUNTER — Encounter: Payer: Self-pay | Admitting: Cardiology

## 2014-08-12 ENCOUNTER — Encounter: Payer: Self-pay | Admitting: Internal Medicine

## 2014-08-27 ENCOUNTER — Other Ambulatory Visit: Payer: Self-pay | Admitting: Cardiovascular Disease

## 2014-09-11 ENCOUNTER — Other Ambulatory Visit: Payer: Self-pay

## 2014-09-11 ENCOUNTER — Encounter: Payer: Self-pay | Admitting: Internal Medicine

## 2014-09-11 ENCOUNTER — Ambulatory Visit (INDEPENDENT_AMBULATORY_CARE_PROVIDER_SITE_OTHER): Payer: BLUE CROSS/BLUE SHIELD | Admitting: Internal Medicine

## 2014-09-11 VITALS — BP 124/70 | HR 70 | Ht 62.0 in | Wt 244.0 lb

## 2014-09-11 DIAGNOSIS — I5022 Chronic systolic (congestive) heart failure: Secondary | ICD-10-CM | POA: Diagnosis not present

## 2014-09-11 DIAGNOSIS — E785 Hyperlipidemia, unspecified: Secondary | ICD-10-CM | POA: Diagnosis not present

## 2014-09-11 DIAGNOSIS — I255 Ischemic cardiomyopathy: Secondary | ICD-10-CM

## 2014-09-11 DIAGNOSIS — Z95810 Presence of automatic (implantable) cardiac defibrillator: Secondary | ICD-10-CM | POA: Diagnosis not present

## 2014-09-11 LAB — CUP PACEART INCLINIC DEVICE CHECK
Brady Statistic RV Percent Paced: 0 %
Date Time Interrogation Session: 20160623152212
HIGH POWER IMPEDANCE MEASURED VALUE: 50 Ohm
HIGH POWER IMPEDANCE MEASURED VALUE: 52 Ohm
HIGH POWER IMPEDANCE MEASURED VALUE: 52 Ohm
HIGH POWER IMPEDANCE MEASURED VALUE: 52 Ohm
HIGH POWER IMPEDANCE MEASURED VALUE: 54 Ohm
HIGH POWER IMPEDANCE MEASURED VALUE: 54 Ohm
HIGH POWER IMPEDANCE MEASURED VALUE: 60 Ohm
HIGH POWER IMPEDANCE MEASURED VALUE: 60 Ohm
HIGH POWER IMPEDANCE MEASURED VALUE: 61 Ohm
HIGH POWER IMPEDANCE MEASURED VALUE: 62 Ohm
HIGH POWER IMPEDANCE MEASURED VALUE: 62 Ohm
HighPow Impedance: 47 Ohm
HighPow Impedance: 51 Ohm
HighPow Impedance: 52 Ohm
HighPow Impedance: 52 Ohm
HighPow Impedance: 52 Ohm
HighPow Impedance: 52 Ohm
HighPow Impedance: 53 Ohm
HighPow Impedance: 53 Ohm
HighPow Impedance: 54 Ohm
HighPow Impedance: 55 Ohm
HighPow Impedance: 61 Ohm
HighPow Impedance: 62 Ohm
HighPow Impedance: 62 Ohm
HighPow Impedance: 62 Ohm
HighPow Impedance: 62 Ohm
HighPow Impedance: 63 Ohm
HighPow Impedance: 63 Ohm
HighPow Impedance: 63 Ohm
HighPow Impedance: 63 Ohm
HighPow Impedance: 64 Ohm
Lead Channel Impedance Value: 400 Ohm
Lead Channel Impedance Value: 408 Ohm
Lead Channel Impedance Value: 416 Ohm
Lead Channel Impedance Value: 416 Ohm
Lead Channel Impedance Value: 416 Ohm
Lead Channel Impedance Value: 416 Ohm
Lead Channel Impedance Value: 424 Ohm
Lead Channel Impedance Value: 424 Ohm
Lead Channel Impedance Value: 424 Ohm
Lead Channel Pacing Threshold Amplitude: 1.5 V
Lead Channel Pacing Threshold Pulse Width: 0.3 ms
Lead Channel Sensing Intrinsic Amplitude: 5.1 mV
Lead Channel Sensing Intrinsic Amplitude: 5.2 mV
Lead Channel Sensing Intrinsic Amplitude: 5.5 mV
Lead Channel Sensing Intrinsic Amplitude: 5.7 mV
Lead Channel Sensing Intrinsic Amplitude: 6.1 mV
Lead Channel Sensing Intrinsic Amplitude: 6.2 mV
Lead Channel Sensing Intrinsic Amplitude: 6.5 mV
Lead Channel Sensing Intrinsic Amplitude: 6.8 mV
Lead Channel Sensing Intrinsic Amplitude: 6.8 mV
Lead Channel Sensing Intrinsic Amplitude: 8.8 mV
Lead Channel Setting Pacing Amplitude: 2 V
Lead Channel Setting Pacing Pulse Width: 0.4 ms
MDC IDC MSMT BATTERY VOLTAGE: 2.64 V
MDC IDC MSMT LEADCHNL RV IMPEDANCE VALUE: 408 Ohm
MDC IDC MSMT LEADCHNL RV IMPEDANCE VALUE: 408 Ohm
MDC IDC MSMT LEADCHNL RV IMPEDANCE VALUE: 424 Ohm
MDC IDC MSMT LEADCHNL RV IMPEDANCE VALUE: 424 Ohm
MDC IDC MSMT LEADCHNL RV IMPEDANCE VALUE: 440 Ohm
MDC IDC MSMT LEADCHNL RV IMPEDANCE VALUE: 456 Ohm
MDC IDC MSMT LEADCHNL RV SENSING INTR AMPL: 5.7 mV
MDC IDC MSMT LEADCHNL RV SENSING INTR AMPL: 6 mV
MDC IDC MSMT LEADCHNL RV SENSING INTR AMPL: 7.5 mV
MDC IDC MSMT LEADCHNL RV SENSING INTR AMPL: 8.4 mV
MDC IDC MSMT LEADCHNL RV SENSING INTR AMPL: 8.5 mV
MDC IDC SET LEADCHNL RV SENSING SENSITIVITY: 0.3 mV
MDC IDC SET ZONE DETECTION INTERVAL: 240 ms
Zone Setting Detection Interval: 280 ms
Zone Setting Detection Interval: 340 ms

## 2014-09-11 NOTE — Progress Notes (Signed)
HPI [name] returns today for followup. She is a very pleasant 64 year old woman with an ischemic cardiomyopathy, status post anterior MI, with a chronically occluded LAD. In the interim, she has been stable. She denies chest pain, shortness of breath, or peripheral edema. She has had no ICD shocks. Her heart failure symptoms are class II. The patient is bothered most by her inability to lose weight. She states that she is less active than she was as she is still working and also cleans her own house. No ICD shocks. She is approaching ERI.  Allergies  Allergen Reactions  . Codeine Nausea And Vomiting  . Crestor [Rosuvastatin]     Muscle aches  . Ecotrin [Aspirin]     BLEEDING ULCER - ONLY IN HIGH DOSES  . Lipitor [Atorvastatin]     Muscle aches  . Nsaids     BLEEDING ULCER  . Sulfa Drugs Cross Reactors Nausea And Vomiting  . Zocor [Simvastatin]     Muscle aches  . Penicillins Rash     Current Outpatient Prescriptions  Medication Sig Dispense Refill  . aspirin 81 MG tablet Take 81 mg by mouth daily.      . carvedilol (COREG) 25 MG tablet TAKE ONE & ONE-HALF TABLETS BY MOUTH TWICE DAILY 90 tablet 10  . cetirizine (ZYRTEC) 10 MG tablet Take 10 mg by mouth daily as needed for allergies.    . Cholecalciferol (VITAMIN D) 2000 UNITS CAPS Take 1 capsule (2,000 Units total) by mouth daily. 30 capsule   . citalopram (CELEXA) 40 MG tablet Take 1 tablet by mouth daily.    Marland Kitchen ezetimibe (ZETIA) 10 MG tablet Take 1 tablet (10 mg total) by mouth daily. 30 tablet 6  . fexofenadine (ALLEGRA) 180 MG tablet Take 180 mg by mouth daily as needed for allergies or rhinitis.    . furosemide (LASIX) 40 MG tablet Take 1 tablet (40 mg total) by mouth daily. 90 tablet 3  . KLOR-CON M10 10 MEQ tablet TAKE ONE TABLET BY MOUTH TWICE DAILY 60 tablet 5  . lisinopril (PRINIVIL,ZESTRIL) 20 MG tablet TAKE ONE TABLET BY MOUTH ONCE DAILY 30 tablet 11  . pravastatin (PRAVACHOL) 20 MG tablet TAKE ONE TABLET BY MOUTH IN THE  EVENING 30 tablet 11  . montelukast (SINGULAIR) 10 MG tablet Take 1 tablet by mouth daily.  0  . [DISCONTINUED] potassium chloride (K-DUR) 10 MEQ tablet Take 1 tablet (10 mEq total) by mouth daily. 30 tablet 11   No current facility-administered medications for this visit.     Past Medical History  Diagnosis Date  . CAD (coronary artery disease)     CHRONICALLY OCCLUDED LAD WITH A LARGE ANTERIOR WALL MI   . CHF (congestive heart failure)     EF 30-40%  . ICD (implantable cardiac defibrillator), single, in situ     [employer]  . Ischemic cardiomyopathy   . Myocardial infarction     ANTERIOR WALL  . Dyslipidemia     ROS:   All systems reviewed and negative except as noted in the HPI.   Past Surgical History  Procedure Laterality Date  . Cardiac catheterization  01/13/05, 06/29/99  . Cardiac defibrillator placement  02/21/05    SINGLE/[employer]TRONIC     Family History  Problem Relation Age of Onset  . Hypertension Father   . Heart attack Mother   . Coronary artery disease Mother   . Aneurysm Brother      History   Social History  . Marital Status: Married  Spouse Name: N/A  . Number of Children: N/A  . Years of Education: N/A   Occupational History  . Not on file.   Social History Main Topics  . Smoking status: Never Smoker   . Smokeless tobacco: Never Used  . Alcohol Use: No  . Drug Use: Not on file  . Sexual Activity: Not on file   Other Topics Concern  . Not on file   Social History Narrative     BP 124/70 mmHg  Pulse 70  Ht 5\' 2"  (1.575 m)  Wt 244 lb (110.678 kg)  BMI 44.62 kg/m2  Physical Exam:  Well appearing obese, middle-age woman,NAD HEENT: Unremarkable Neck:  6 cm JVD, no thyromegally Back:  No CVA tenderness Lungs:  Clear with no wheezes, rales, or rhonchi. HEART:  Regular rate rhythm, no murmurs, no rubs, no clicks Abd:  soft, positive bowel sounds, no organomegally, no rebound, no guarding Ext:  2 plus pulses, no edema, no  cyanosis, no clubbing Skin:  No rashes no nodules Neuro:  CN II through XII intact, motor grossly intact   DEVICE  Normal device function.  See PaceArt for details.   Assess/Plan:

## 2014-09-11 NOTE — Patient Instructions (Addendum)
Medication Instructions:  Your physician recommends that you continue on your current medications as directed. Please refer to the Current Medication list given to you today.   Labwork: NONE  Testing/Procedures: NONE  Follow-Up: Your physician wants you to follow-up in: 12 months with Dr. Ladona Ridgel. You will receive a reminder letter in the mail two months in advance. If you don't receive a letter, please call our office to schedule the follow-up appointment.  Remote monitoring is used to monitor your Pacemaker or ICD from home. This monitoring reduces the number of office visits required to check your device to one time per year. It allows [place] to keep an eye on the functioning of your device to ensure it is working properly. You are scheduled for a device check from home on 10/13/2014. You may send your transmission at any time that day. If you have a wireless device, the transmission will be sent automatically. After your physician reviews your transmission, you will receive a postcard with your next transmission date.    Any Other Special Instructions Will Be Listed Below (If Applicable).

## 2014-09-12 NOTE — Assessment & Plan Note (Signed)
Her symptoms are well compensated. No change in meds.  She is encouraged to lose weight.

## 2014-09-12 NOTE — Assessment & Plan Note (Signed)
She denies anginal symptoms. She is encouraged to increase her activity. No change in meds.

## 2014-09-12 NOTE — Assessment & Plan Note (Signed)
She will continue her statin therapy.  

## 2014-09-12 NOTE — Assessment & Plan Note (Signed)
Her device is approaching ERI. We discussed the treatment options. We will have her obtain a 2D echo to see if her EF has improved. If so we would consider not reimplanting. She has a 6949 lead in place and if EF is still down, would consider removing lead and placing a new lead at the time of surgery.

## 2014-09-29 ENCOUNTER — Telehealth: Payer: Self-pay | Admitting: Cardiology

## 2014-09-29 NOTE — Telephone Encounter (Signed)
Informed that her tansmission was received and that she has reached ERI. Pt verbalized understanding and is aware that device will beep every day at the same time for the next 16 days and she can come to office to have alert turned off while she waits for an appt w/ MD.

## 2014-09-29 NOTE — Telephone Encounter (Signed)
Pt called and stated that her ICD beeped on Sunday but has not done it today. Instructed pt to send manual transmission. Pt verbalized understanding.

## 2014-10-08 ENCOUNTER — Other Ambulatory Visit: Payer: Self-pay

## 2014-10-08 ENCOUNTER — Ambulatory Visit (INDEPENDENT_AMBULATORY_CARE_PROVIDER_SITE_OTHER): Payer: BLUE CROSS/BLUE SHIELD | Admitting: Internal Medicine

## 2014-10-08 ENCOUNTER — Encounter: Payer: Self-pay | Admitting: *Deleted

## 2014-10-08 ENCOUNTER — Encounter: Payer: Self-pay | Admitting: Internal Medicine

## 2014-10-08 VITALS — BP 140/70 | HR 71 | Ht 63.0 in | Wt 244.6 lb

## 2014-10-08 DIAGNOSIS — I5022 Chronic systolic (congestive) heart failure: Secondary | ICD-10-CM | POA: Diagnosis not present

## 2014-10-08 DIAGNOSIS — I2510 Atherosclerotic heart disease of native coronary artery without angina pectoris: Secondary | ICD-10-CM | POA: Diagnosis not present

## 2014-10-08 DIAGNOSIS — I255 Ischemic cardiomyopathy: Secondary | ICD-10-CM

## 2014-10-08 DIAGNOSIS — Z95810 Presence of automatic (implantable) cardiac defibrillator: Secondary | ICD-10-CM

## 2014-10-08 DIAGNOSIS — E785 Hyperlipidemia, unspecified: Secondary | ICD-10-CM

## 2014-10-08 DIAGNOSIS — I2583 Coronary atherosclerosis due to lipid rich plaque: Secondary | ICD-10-CM

## 2014-10-08 LAB — CUP PACEART INCLINIC DEVICE CHECK
Battery Voltage: 2.64 V
Brady Statistic RV Percent Paced: 0 %
Date Time Interrogation Session: 20160720151412
HIGH POWER IMPEDANCE MEASURED VALUE: 49 Ohm
HIGH POWER IMPEDANCE MEASURED VALUE: 50 Ohm
HIGH POWER IMPEDANCE MEASURED VALUE: 51 Ohm
HIGH POWER IMPEDANCE MEASURED VALUE: 52 Ohm
HIGH POWER IMPEDANCE MEASURED VALUE: 53 Ohm
HIGH POWER IMPEDANCE MEASURED VALUE: 53 Ohm
HIGH POWER IMPEDANCE MEASURED VALUE: 54 Ohm
HIGH POWER IMPEDANCE MEASURED VALUE: 61 Ohm
HIGH POWER IMPEDANCE MEASURED VALUE: 62 Ohm
HIGH POWER IMPEDANCE MEASURED VALUE: 63 Ohm
HIGH POWER IMPEDANCE MEASURED VALUE: 63 Ohm
HighPow Impedance: 47 Ohm
HighPow Impedance: 51 Ohm
HighPow Impedance: 52 Ohm
HighPow Impedance: 53 Ohm
HighPow Impedance: 53 Ohm
HighPow Impedance: 54 Ohm
HighPow Impedance: 55 Ohm
HighPow Impedance: 55 Ohm
HighPow Impedance: 56 Ohm
HighPow Impedance: 58 Ohm
HighPow Impedance: 61 Ohm
HighPow Impedance: 61 Ohm
HighPow Impedance: 62 Ohm
HighPow Impedance: 62 Ohm
HighPow Impedance: 62 Ohm
HighPow Impedance: 62 Ohm
HighPow Impedance: 63 Ohm
HighPow Impedance: 63 Ohm
HighPow Impedance: 64 Ohm
HighPow Impedance: 65 Ohm
Lead Channel Impedance Value: 408 Ohm
Lead Channel Impedance Value: 408 Ohm
Lead Channel Impedance Value: 416 Ohm
Lead Channel Impedance Value: 416 Ohm
Lead Channel Impedance Value: 416 Ohm
Lead Channel Impedance Value: 416 Ohm
Lead Channel Impedance Value: 416 Ohm
Lead Channel Impedance Value: 424 Ohm
Lead Channel Impedance Value: 424 Ohm
Lead Channel Impedance Value: 424 Ohm
Lead Channel Impedance Value: 432 Ohm
Lead Channel Sensing Intrinsic Amplitude: 4.7 mV
Lead Channel Sensing Intrinsic Amplitude: 4.8 mV
Lead Channel Sensing Intrinsic Amplitude: 5.6 mV
Lead Channel Sensing Intrinsic Amplitude: 5.7 mV
Lead Channel Sensing Intrinsic Amplitude: 6.5 mV
Lead Channel Sensing Intrinsic Amplitude: 6.6 mV
Lead Channel Setting Pacing Amplitude: 2 V
Lead Channel Setting Pacing Pulse Width: 0.4 ms
Lead Channel Setting Sensing Sensitivity: 0.3 mV
MDC IDC MSMT LEADCHNL RV IMPEDANCE VALUE: 416 Ohm
MDC IDC MSMT LEADCHNL RV IMPEDANCE VALUE: 432 Ohm
MDC IDC MSMT LEADCHNL RV IMPEDANCE VALUE: 432 Ohm
MDC IDC MSMT LEADCHNL RV IMPEDANCE VALUE: 432 Ohm
MDC IDC MSMT LEADCHNL RV SENSING INTR AMPL: 10.2 mV
MDC IDC MSMT LEADCHNL RV SENSING INTR AMPL: 5.6 mV
MDC IDC MSMT LEADCHNL RV SENSING INTR AMPL: 5.6 mV
MDC IDC MSMT LEADCHNL RV SENSING INTR AMPL: 6.2 mV
MDC IDC MSMT LEADCHNL RV SENSING INTR AMPL: 9.7 mV
MDC IDC SET ZONE DETECTION INTERVAL: 240 ms
MDC IDC SET ZONE DETECTION INTERVAL: 280 ms
Zone Setting Detection Interval: 340 ms

## 2014-10-08 NOTE — Patient Instructions (Signed)
Medication Instructions:   Your physician recommends that you continue on your current medications as directed. Please refer to the Current Medication list given to you today.   Labwork:   Testing/Procedures: Your physician has requested that you have an echocardiogram.NEXT WEEK  Echocardiography is a painless test that uses sound waves to create images of your heart. It provides your doctor with information about the size and shape of your heart and how well your heart's chambers and valves are working. This procedure takes approximately one hour. There are no restrictions for this procedure.    Follow-Up:  WILL BE CONTACTED BACK AFTER ECHO  RESULTS TO DETERMINE TIMEFRAME OF GENERATOR CHANGE  Any Other Special Instructions Will Be Listed Below (If Applicable).

## 2014-10-08 NOTE — Assessment & Plan Note (Signed)
Her heart failure symptoms are stable, class 2. She has not had an assessment of her LV function for many years. I have recommended that she undergo 2-D echo. We'll make a decision about reimplantation of a new device based on the results of her echo. A separate question that has come up is regarding her 6949 Medtronic lead. It is currently working normally but is a recall lead. In addition, it is known that the failure rate on these leads is very high early after device change out. For this reason we have recommended insertion of a new RV lead. I have given the patient an option as to whether to undergo extraction of the 6949-lead, or capping of this lead. She is reflection on this. Of course this would change our surgical procedure, specifically its location.

## 2014-10-08 NOTE — Progress Notes (Signed)
HPI [name] returns today for followup. She is a very pleasant 64 year old woman with an ischemic cardiomyopathy, status post anterior MI, with a chronically occluded LAD. In the interim, she has been stable but has reached ERI on her ICD. She denies chest pain, shortness of breath, or peripheral edema. She has had no ICD shocks. Her heart failure symptoms are class II. The patient is bothered most by her inability to lose weight. She states that she is less active than she was as she is still working and also cleans her own house. No ICD shocks. She has not had an assessment of her LV function for many years.  Allergies  Allergen Reactions  . Codeine Nausea And Vomiting  . Crestor [Rosuvastatin]     Muscle aches  . Ecotrin [Aspirin]     BLEEDING ULCER - ONLY IN HIGH DOSES  . Lipitor [Atorvastatin]     Muscle aches  . Nsaids     BLEEDING ULCER  . Sulfa Drugs Cross Reactors Nausea And Vomiting  . Zocor [Simvastatin]     Muscle aches  . Penicillins Rash     Current Outpatient Prescriptions  Medication Sig Dispense Refill  . aspirin 81 MG tablet Take 81 mg by mouth daily.      . carvedilol (COREG) 25 MG tablet TAKE ONE & ONE-HALF TABLETS BY MOUTH TWICE DAILY 90 tablet 10  . cefdinir (OMNICEF) 300 MG capsule Take 300 mg by mouth 2 (two) times daily.  0  . cetirizine (ZYRTEC) 10 MG tablet Take 10 mg by mouth daily as needed for allergies.    . Cholecalciferol (VITAMIN D) 2000 UNITS CAPS Take 1 capsule (2,000 Units total) by mouth daily. 30 capsule   . citalopram (CELEXA) 40 MG tablet Take 1 tablet by mouth daily.    Marland Kitchen. ezetimibe (ZETIA) 10 MG tablet Take 1 tablet (10 mg total) by mouth daily. 30 tablet 6  . fexofenadine (ALLEGRA) 180 MG tablet Take 180 mg by mouth daily as needed for allergies or rhinitis.    . furosemide (LASIX) 40 MG tablet Take 1 tablet (40 mg total) by mouth daily. 90 tablet 3  . lisinopril (PRINIVIL,ZESTRIL) 20 MG tablet TAKE ONE TABLET BY MOUTH ONCE DAILY 30 tablet  11  . montelukast (SINGULAIR) 10 MG tablet Take 1 tablet by mouth daily.  0  . potassium chloride (K-DUR) 10 MEQ tablet Take 10 mEq by mouth daily.    . pravastatin (PRAVACHOL) 20 MG tablet TAKE ONE TABLET BY MOUTH IN THE EVENING 30 tablet 11   No current facility-administered medications for this visit.     Past Medical History  Diagnosis Date  . CAD (coronary artery disease)     CHRONICALLY OCCLUDED LAD WITH A LARGE ANTERIOR WALL MI   . CHF (congestive heart failure)     EF 30-40%  . ICD (implantable cardiac defibrillator), single, in situ     [employer]  . Ischemic cardiomyopathy   . Myocardial infarction     ANTERIOR WALL  . Dyslipidemia     ROS:   All systems reviewed and negative except as noted in the HPI.   Past Surgical History  Procedure Laterality Date  . Cardiac catheterization  01/13/05, 06/29/99  . Cardiac defibrillator placement  02/21/05    SINGLE/[employer]TRONIC     Family History  Problem Relation Age of Onset  . Hypertension Father   . Heart attack Mother   . Coronary artery disease Mother   . Aneurysm Brother  History   Social History  . Marital Status: Married    Spouse Name: N/A  . Number of Children: N/A  . Years of Education: N/A   Occupational History  . Not on file.   Social History Main Topics  . Smoking status: Never Smoker   . Smokeless tobacco: Never Used  . Alcohol Use: No  . Drug Use: Not on file  . Sexual Activity: Not on file   Other Topics Concern  . Not on file   Social History Narrative     BP 140/70 mmHg  Pulse 71  Ht  (1.6 m)  Wt 244 lb 9.6 oz (110.95 kg)  BMI 43.34 kg/m2  Physical Exam:  Well appearing obese, middle-age woman,NAD HEENT: Unremarkable Neck:  6 cm JVD, no thyromegally Back:  No CVA tenderness Lungs:  Clear with no wheezes, rales, or rhonchi. HEART:  Regular rate rhythm, no murmurs, no rubs, no clicks Abd:  soft, positive bowel sounds, no organomegally, no rebound, no  guarding Ext:  2 plus pulses, no edema, no cyanosis, no clubbing Skin:  No rashes no nodules Neuro:  CN II through XII intact, motor grossly intact   DEVICE  Normal device function.  See PaceArt for details. ERI  Assess/Plan:

## 2014-10-08 NOTE — Assessment & Plan Note (Signed)
She is status post MI with a chronically occluded LAD. She is currently free of angina. She will continue her current medications.

## 2014-10-08 NOTE — Assessment & Plan Note (Signed)
She will continue her statin therapy. She is encouraged to lose weight.

## 2014-10-08 NOTE — Assessment & Plan Note (Signed)
She is presently at elective replacement. We will repeat her echo and make a decision about reimplantation based on the results of her 2-D echo.

## 2014-10-09 ENCOUNTER — Encounter: Payer: Self-pay | Admitting: Internal Medicine

## 2014-10-10 ENCOUNTER — Other Ambulatory Visit: Payer: Self-pay | Admitting: Cardiovascular Disease

## 2014-10-15 ENCOUNTER — Ambulatory Visit (HOSPITAL_COMMUNITY): Payer: BLUE CROSS/BLUE SHIELD | Attending: Cardiology

## 2014-10-15 ENCOUNTER — Other Ambulatory Visit: Payer: Self-pay

## 2014-10-15 DIAGNOSIS — I255 Ischemic cardiomyopathy: Secondary | ICD-10-CM | POA: Diagnosis not present

## 2014-10-15 DIAGNOSIS — I2510 Atherosclerotic heart disease of native coronary artery without angina pectoris: Secondary | ICD-10-CM | POA: Insufficient documentation

## 2014-10-15 DIAGNOSIS — E669 Obesity, unspecified: Secondary | ICD-10-CM | POA: Insufficient documentation

## 2014-10-15 DIAGNOSIS — I252 Old myocardial infarction: Secondary | ICD-10-CM | POA: Diagnosis not present

## 2014-10-15 DIAGNOSIS — I5022 Chronic systolic (congestive) heart failure: Secondary | ICD-10-CM | POA: Insufficient documentation

## 2014-10-15 DIAGNOSIS — E785 Hyperlipidemia, unspecified: Secondary | ICD-10-CM | POA: Diagnosis not present

## 2014-10-15 DIAGNOSIS — Z6841 Body Mass Index (BMI) 40.0 and over, adult: Secondary | ICD-10-CM | POA: Diagnosis not present

## 2014-10-15 MED ORDER — PERFLUTREN LIPID MICROSPHERE
3.0000 mL | Freq: Once | INTRAVENOUS | Status: AC
Start: 2014-10-15 — End: 2014-10-15
  Administered 2014-10-15: 3 mL via INTRAVENOUS

## 2014-10-24 ENCOUNTER — Telehealth: Payer: Self-pay | Admitting: Internal Medicine

## 2014-10-24 NOTE — Telephone Encounter (Signed)
New message  Pt called back. Would like to schedule for the 22nd, 23rd or 24th and doesn't wish to replace the Lead.

## 2014-10-27 ENCOUNTER — Other Ambulatory Visit: Payer: Self-pay | Admitting: Internal Medicine

## 2014-10-28 NOTE — Telephone Encounter (Signed)
Follow UP  Pt requests a call back to discuss previous message.    Would like to schedule for the 22nd, 23rd or 24th and doesn't wish to replace the Lead.

## 2014-10-28 NOTE — Telephone Encounter (Signed)
Pt states that Tresa Endo was supposed to scheduled pt for a generator changed for the 22 Nd, 23 rd or 24 th she needs to know the date and time. Pt also states that she would like to not to replaced the leads only the generator. Pt would like to [place] to call her back tomorrow with that information.

## 2014-10-29 NOTE — Telephone Encounter (Signed)
Spoke with patient and she is set up for Fri 8/26 at 1pm  She is going to have labs drawn on 8/22 and has an apt on 8/24 with Dr Elease Hashimoto of which she will keep.  She will get an instruction sheet for procedure at her apt on 8/24

## 2014-11-06 ENCOUNTER — Other Ambulatory Visit: Payer: Self-pay | Admitting: *Deleted

## 2014-11-06 DIAGNOSIS — T82110A Breakdown (mechanical) of cardiac electrode, initial encounter: Secondary | ICD-10-CM

## 2014-11-06 DIAGNOSIS — Z4502 Encounter for adjustment and management of automatic implantable cardiac defibrillator: Secondary | ICD-10-CM

## 2014-11-06 DIAGNOSIS — I5022 Chronic systolic (congestive) heart failure: Secondary | ICD-10-CM

## 2014-11-10 ENCOUNTER — Other Ambulatory Visit (INDEPENDENT_AMBULATORY_CARE_PROVIDER_SITE_OTHER): Payer: BLUE CROSS/BLUE SHIELD

## 2014-11-10 DIAGNOSIS — I2510 Atherosclerotic heart disease of native coronary artery without angina pectoris: Secondary | ICD-10-CM | POA: Diagnosis not present

## 2014-11-10 DIAGNOSIS — I5022 Chronic systolic (congestive) heart failure: Secondary | ICD-10-CM

## 2014-11-10 DIAGNOSIS — E785 Hyperlipidemia, unspecified: Secondary | ICD-10-CM

## 2014-11-10 LAB — HEPATIC FUNCTION PANEL
ALBUMIN: 3.9 g/dL (ref 3.5–5.2)
ALK PHOS: 62 U/L (ref 39–117)
ALT: 12 U/L (ref 0–35)
AST: 12 U/L (ref 0–37)
Bilirubin, Direct: 0.1 mg/dL (ref 0.0–0.3)
Total Bilirubin: 0.4 mg/dL (ref 0.2–1.2)
Total Protein: 6.6 g/dL (ref 6.0–8.3)

## 2014-11-10 LAB — LIPID PANEL
CHOL/HDL RATIO: 3
Cholesterol: 163 mg/dL (ref 0–200)
HDL: 46.8 mg/dL (ref >39.00)
LDL Cholesterol: 98 mg/dL (ref 0–99)
NONHDL: 116.35
Triglycerides: 93 mg/dL (ref 0.0–149.0)
VLDL: 18.6 mg/dL (ref 0.0–40.0)

## 2014-11-10 LAB — BASIC METABOLIC PANEL
BUN: 15 mg/dL (ref 6–23)
CHLORIDE: 104 meq/L (ref 96–112)
CO2: 31 meq/L (ref 19–32)
CREATININE: 0.78 mg/dL (ref 0.40–1.20)
Calcium: 9.1 mg/dL (ref 8.4–10.5)
GFR: 78.79 mL/min (ref >60.00)
Glucose, Bld: 95 mg/dL (ref 70–99)
Potassium: 4.1 mEq/L (ref 3.5–5.1)
Sodium: 142 mEq/L (ref 135–145)

## 2014-11-12 ENCOUNTER — Encounter: Payer: Self-pay | Admitting: Cardiovascular Disease

## 2014-11-12 ENCOUNTER — Ambulatory Visit (INDEPENDENT_AMBULATORY_CARE_PROVIDER_SITE_OTHER): Payer: BLUE CROSS/BLUE SHIELD | Admitting: Cardiovascular Disease

## 2014-11-12 VITALS — BP 126/64 | HR 64 | Ht 63.0 in | Wt 245.8 lb

## 2014-11-12 DIAGNOSIS — I5022 Chronic systolic (congestive) heart failure: Secondary | ICD-10-CM | POA: Diagnosis not present

## 2014-11-12 DIAGNOSIS — I2583 Coronary atherosclerosis due to lipid rich plaque: Principal | ICD-10-CM

## 2014-11-12 DIAGNOSIS — I2510 Atherosclerotic heart disease of native coronary artery without angina pectoris: Secondary | ICD-10-CM | POA: Diagnosis not present

## 2014-11-13 MED ORDER — CHLORHEXIDINE GLUCONATE 4 % EX LIQD
60.0000 mL | Freq: Once | CUTANEOUS | Status: DC
  Filled 2014-11-13: qty 60

## 2014-11-13 MED ORDER — SODIUM CHLORIDE 0.9 % IR SOLN
80.0000 mg | Status: DC
  Filled 2014-11-13: qty 2

## 2014-11-13 MED ORDER — SODIUM CHLORIDE 0.9 % IV SOLN
INTRAVENOUS | Status: DC
  Administered 2014-11-14: 12:00:00 via INTRAVENOUS

## 2014-11-13 MED ORDER — VANCOMYCIN HCL 10 G IV SOLR
1500.0000 mg | INTRAVENOUS | Status: DC
  Filled 2014-11-13 (×2): qty 1500

## 2014-11-13 NOTE — Patient Instructions (Signed)
Medication Instructions:  Your physician recommends that you continue on your current medications as directed. Please refer to the Current Medication list given to you today.   Labwork: None Ordered - results sent to you by mail   Testing/Procedures: None Ordered   Follow-Up: Your physician wants you to follow-up in: 1 year with Dr. Elease Hashimoto.  You will receive a reminder letter in the mail two months in advance. If you don't receive a letter, please call our office to schedule the follow-up appointment.

## 2014-11-13 NOTE — Progress Notes (Signed)
Cardiology Office Note   Date:  11/13/2014   ID:  [name], DOB 28-Jun-1950, MRN [id number]  PCP:  Deloris Ping, MD  Cardiologist:   Vesta Mixer, MD   No chief complaint on file.  1. Coronary artery disease-status post chronic LAD occlusion with a large anterior wall myocardial infarction 2. Congestive heart failure-EF of 30-40% 3. AICD placement 4. Hyperlipidemia 5 obesity  History of Present Illness:  [name] is doing very well from a cardiac standpoint. She's not had any episodes of chest pain or shortness of breath. She is able to do all of her normal activities without any significant problems. She has not had much success in losing weight  She denies any syncope or presyncope.  September 14, 2012:  [name] is doing well. She's not having any episodes of chest pain or shortness of breath. Her ICD has been interrogated recently and was found to be working properly. She has no CP or dyspnea when she does her regular activities. She does not get any regular exercise.   June 13, 2013:  [name] is doing ok. She is exercising some. Walks about 20 minutes a day - several days a week. No CP or dyspnea.    Feb. 25, 2016:  [name] is a 64 y.o. female who presents for follow up of her CAD and CHF.   Her ICD is working well.  She will need to have the battery checked more often recently because  it is nearing ERI.  She is not getting exercise.   Denies any chest pain  Or dyspnea.   Needs to lose weight.  [facility]   November 12, 2014:  Patient is doing well. No cp or dyspnea. Perhaps getting some exercise   Past Medical History  Diagnosis Date  . CAD (coronary artery disease)     CHRONICALLY OCCLUDED LAD WITH A LARGE ANTERIOR WALL MI   . CHF (congestive heart failure)     EF 30-40%  . ICD (implantable cardiac defibrillator), single, in situ     MEDTRONIC  . Ischemic cardiomyopathy   . Myocardial infarction     ANTERIOR  WALL  . Dyslipidemia     Past Surgical History  Procedure Laterality Date  . Cardiac catheterization  01/13/05, 06/29/99  . Cardiac defibrillator placement  02/21/05    SINGLE/MEDTRONIC     Current Outpatient Prescriptions  Medication Sig Dispense Refill  . aspirin 81 MG tablet Take 81 mg by mouth daily.      . carvedilol (COREG) 25 MG tablet TAKE ONE & ONE-HALF TABLETS BY MOUTH TWICE DAILY (Patient taking differently: TAKE 37.5 MG BY MOUTH TWICE DAILY) 90 tablet 10  . cetirizine (ZYRTEC) 10 MG tablet Take 10 mg by mouth daily as needed for allergies.    . Cholecalciferol (VITAMIN D) 2000 UNITS CAPS Take 1 capsule (2,000 Units total) by mouth daily. 30 capsule   . citalopram (CELEXA) 40 MG tablet Take 40 mg by mouth daily.     Marland Kitchen ezetimibe (ZETIA) 10 MG tablet Take 1 tablet (10 mg total) by mouth daily. (Patient not taking: Reported on 11/13/2014) 30 tablet 6  . fexofenadine (ALLEGRA) 180 MG tablet Take 180 mg by mouth daily as needed for allergies or rhinitis.    . furosemide (LASIX) 40 MG tablet Take 1 tablet (40 mg total) by mouth daily. 90 tablet 3  . KLOR-CON M10 10 MEQ tablet TAKE ONE TABLET BY MOUTH TWICE  DAILY (Patient taking differently: Take 10 meq by mouth once daily) 60 tablet 1  . lisinopril (PRINIVIL,ZESTRIL) 20 MG tablet TAKE ONE TABLET BY MOUTH ONCE DAILY (Patient taking differently: TAKE 20 MG BY MOUTH ONCE DAILY) 30 tablet 1  . montelukast (SINGULAIR) 10 MG tablet Take 10 mg by mouth daily.   0  . pravastatin (PRAVACHOL) 20 MG tablet TAKE ONE TABLET BY MOUTH IN THE EVENING (Patient taking differently: TAKE 20 MG BY MOUTH IN THE EVENING) 30 tablet 11   No current facility-administered medications for this visit.   Facility-Administered Medications Ordered in Other Visits  Medication Dose Route Frequency Provider Last Rate Last Dose  . 0.9 %  sodium chloride infusion   Intravenous Continuous Marinus Maw, MD      . chlorhexidine (HIBICLENS) 4 % liquid 4 application  60  mL Topical Once Marinus Maw, MD      . gentamicin (GARAMYCIN) 80 mg in sodium chloride irrigation 0.9 % 500 mL irrigation  80 mg Irrigation On Call Marinus Maw, MD      . vancomycin (VANCOCIN) 1,500 mg in sodium chloride 0.9 % 500 mL IVPB  1,500 mg Intravenous On Call Marinus Maw, MD        Allergies:   Codeine; Crestor; Ecotrin; Lipitor; Nsaids; Penicillins; Sulfa drugs cross reactors; Vitamin c; and Zocor    Social History:  The patient  reports that she has never smoked. She has never used smokeless tobacco. She reports that she does not drink alcohol.   Family History:  The patient's family history includes Aneurysm in her brother; Coronary artery disease in her mother; Heart attack in her mother; Hypertension in her father.    ROS:  Please see the history of present illness.    Review of Systems: Constitutional:  denies fever, chills, diaphoresis, appetite change and fatigue.  HEENT: denies photophobia, eye pain, redness, hearing loss, ear pain, congestion, sore throat, rhinorrhea, sneezing, neck pain, neck stiffness and tinnitus.  Respiratory: denies SOB, DOE, cough, chest tightness, and wheezing.  Cardiovascular: denies chest pain, palpitations and leg swelling.  Gastrointestinal: denies nausea, vomiting, abdominal pain, diarrhea, constipation, blood in stool.  Genitourinary: denies dysuria, urgency, frequency, hematuria, flank pain and difficulty urinating.  Musculoskeletal: denies  myalgias, back pain, joint swelling, arthralgias and gait problem.   Skin: denies pallor, rash and wound.  Neurological: denies dizziness, seizures, syncope, weakness, light-headedness, numbness and headaches.   Hematological: denies adenopathy, easy bruising, personal or family bleeding history.  Psychiatric/ Behavioral: denies suicidal ideation, mood changes, confusion, nervousness, sleep disturbance and agitation.       All other systems are reviewed and negative.    PHYSICAL  EXAM: VS:  BP 126/64 mmHg  Pulse 64  Ht  (1.6 m)  Wt 111.494 kg (245 lb 12.8 oz)  BMI 43.55 kg/m2  SpO2 94% , BMI Body mass index is 43.55 kg/(m^2). GEN: Well nourished, well developed, in no acute distress HEENT: normal Neck: no JVD, carotid bruits, or masses Cardiac: RRR; no murmurs, rubs, or gallops,no edema  Respiratory:  clear to auscultation bilaterally, normal work of breathing GI: soft, nontender, nondistended, + BS MS: no deformity or atrophy Skin: warm and dry, no rash Neuro:  Strength and sensation are intact Psych: normal   EKG:  EKG is ordered today. The ekg ordered today demonstrates NSR at 71. NS ST abn.    Recent Labs: 11/10/2014: ALT 12; BUN 15; Creatinine, Ser 0.78; Potassium 4.1; Sodium 142  Lipid Panel    Component Value Date/Time   CHOL 163 11/10/2014 0926   TRIG 93.0 11/10/2014 0926   HDL 46.80 11/10/2014 0926   CHOLHDL 3 11/10/2014 0926   VLDL 18.6 11/10/2014 0926   LDLCALC 98 11/10/2014 0926      Wt Readings from Last 3 Encounters:  11/12/14 111.494 kg (245 lb 12.8 oz)  10/08/14 110.95 kg (244 lb 9.6 oz)  09/11/14 110.678 kg (244 lb)      Other studies Reviewed: Additional studies/ records that were reviewed today include: . Review of the above records demonstrates:    ASSESSMENT AND PLAN:  1. Coronary artery disease-status post chronic LAD occlusion with a large anterior wall myocardial infarction- she is doing well.  No CP .  No worsening of her dyspnea.  Continue current meds.   2. Congestive heart failure-EF of 30-40% - stable. Continue current meds.  3. AICD placement - follow up with Dr. Ladona Ridgel   4. Hyperlipidemia - followed in lipid clinic  5 obesity - encouraged her to lose some weight   Current medicines are reviewed at length with the patient today.  The patient does not have concerns regarding medicines.  The following changes have been made:  no change   Disposition:   FU with 1 year   Signed, [name],  Deloris Ping, MD  11/13/2014 9:25 PM    [facility] [address] [place], [place], [place]  [address] Phone: [phone]; Fax: [phone]

## 2014-11-14 ENCOUNTER — Ambulatory Visit (HOSPITAL_COMMUNITY)
Admission: RE | Admit: 2014-11-14 | Discharge: 2014-11-14 | Disposition: A | Discharge status: Home / Self Care | Payer: BLUE CROSS/BLUE SHIELD | Source: Ambulatory Visit | Attending: Internal Medicine | Admitting: Internal Medicine

## 2014-11-14 ENCOUNTER — Encounter (HOSPITAL_COMMUNITY)
Admission: RE | Disposition: A | Discharge status: Home / Self Care | Payer: Self-pay | Source: Ambulatory Visit | Attending: Internal Medicine

## 2014-11-14 DIAGNOSIS — Z8249 Family history of ischemic heart disease and other diseases of the circulatory system: Secondary | ICD-10-CM | POA: Insufficient documentation

## 2014-11-14 DIAGNOSIS — I252 Old myocardial infarction: Secondary | ICD-10-CM | POA: Diagnosis not present

## 2014-11-14 DIAGNOSIS — I2510 Atherosclerotic heart disease of native coronary artery without angina pectoris: Secondary | ICD-10-CM | POA: Diagnosis not present

## 2014-11-14 DIAGNOSIS — E669 Obesity, unspecified: Secondary | ICD-10-CM | POA: Diagnosis not present

## 2014-11-14 DIAGNOSIS — I255 Ischemic cardiomyopathy: Secondary | ICD-10-CM | POA: Diagnosis not present

## 2014-11-14 DIAGNOSIS — I5022 Chronic systolic (congestive) heart failure: Secondary | ICD-10-CM

## 2014-11-14 DIAGNOSIS — T82110A Breakdown (mechanical) of cardiac electrode, initial encounter: Secondary | ICD-10-CM

## 2014-11-14 DIAGNOSIS — I82B12 Acute embolism and thrombosis of left subclavian vein: Secondary | ICD-10-CM | POA: Diagnosis not present

## 2014-11-14 DIAGNOSIS — E785 Hyperlipidemia, unspecified: Secondary | ICD-10-CM | POA: Diagnosis not present

## 2014-11-14 DIAGNOSIS — Z882 Allergy status to sulfonamides status: Secondary | ICD-10-CM | POA: Insufficient documentation

## 2014-11-14 DIAGNOSIS — Z6841 Body Mass Index (BMI) 40.0 and over, adult: Secondary | ICD-10-CM | POA: Diagnosis not present

## 2014-11-14 DIAGNOSIS — Z880 Allergy status to penicillin: Secondary | ICD-10-CM | POA: Diagnosis not present

## 2014-11-14 DIAGNOSIS — Z95810 Presence of automatic (implantable) cardiac defibrillator: Secondary | ICD-10-CM | POA: Insufficient documentation

## 2014-11-14 DIAGNOSIS — Z4502 Encounter for adjustment and management of automatic implantable cardiac defibrillator: Secondary | ICD-10-CM

## 2014-11-14 DIAGNOSIS — Z7982 Long term (current) use of aspirin: Secondary | ICD-10-CM | POA: Diagnosis not present

## 2014-11-14 HISTORY — PX: EP IMPLANTABLE DEVICE: SHX172B

## 2014-11-14 LAB — CBC
HCT: 40 % (ref 36.0–46.0)
Hemoglobin: 13.5 g/dL (ref 12.0–15.0)
MCH: 32.1 pg (ref 26.0–34.0)
MCHC: 33.8 g/dL (ref 30.0–36.0)
MCV: 95 fL (ref 78.0–100.0)
PLATELETS: 214 10*3/uL (ref 150–400)
RBC: 4.21 MIL/uL (ref 3.87–5.11)
RDW: 13.7 % (ref 11.5–15.5)
WBC: 7.8 10*3/uL (ref 4.0–10.5)

## 2014-11-14 LAB — PROTIME-INR
INR: 1.16 (ref 0.00–1.49)
Prothrombin Time: 15 seconds (ref 11.6–15.2)

## 2014-11-14 LAB — SURGICAL PCR SCREEN
MRSA, PCR: NEGATIVE
STAPHYLOCOCCUS AUREUS: NEGATIVE

## 2014-11-14 SURGERY — ICD/BIV ICD GENERATOR CHANGEOUT

## 2014-11-14 MED ORDER — MUPIROCIN 2 % EX OINT
1.0000 "application " | TOPICAL_OINTMENT | Freq: Once | CUTANEOUS | Status: DC
  Filled 2014-11-14: qty 22

## 2014-11-14 MED ORDER — LIDOCAINE HCL (PF) 1 % IJ SOLN
INTRAMUSCULAR | Status: AC
  Filled 2014-11-14: qty 60

## 2014-11-14 MED ORDER — MUPIROCIN 2 % EX OINT
TOPICAL_OINTMENT | CUTANEOUS | Status: AC
  Administered 2014-11-14: 1
  Filled 2014-11-14: qty 22

## 2014-11-14 MED ORDER — SODIUM CHLORIDE 0.9 % IR SOLN
Status: AC
  Filled 2014-11-14: qty 2

## 2014-11-14 MED ORDER — IOHEXOL 350 MG/ML SOLN
INTRAVENOUS | Status: DC | PRN
  Administered 2014-11-14: 10 mL via INTRAVENOUS

## 2014-11-14 NOTE — Discharge Instructions (Signed)
Venogram, Care After °Refer to this sheet in the next few weeks. These instructions provide you with information on caring for yourself after your procedure. Your health care provider may also give you more specific instructions. Your treatment has been planned according to current medical practices, but problems sometimes occur. Call your health care provider if you have any problems or questions after your procedure. °WHAT TO EXPECT AFTER THE PROCEDURE °After your procedure, it is typical to have the following sensations: °· Mild discomfort at the catheter insertion site. °HOME CARE INSTRUCTIONS  °· Take all medicines exactly as directed. °· Follow any prescribed diet. °· Follow instructions regarding both rest and physical activity. °· Drink more fluids for the first several days after the procedure in order to help flush dye from your kidneys. °SEEK MEDICAL CARE IF: °· You develop a rash. °· You have fever not controlled by medicine. °SEEK IMMEDIATE MEDICAL CARE IF: °· There is pain, drainage, bleeding, redness, swelling, warmth or a red streak at the site of the IV tube. °· The extremity where your IV tube was placed becomes discolored, numb, or cool. °· You have difficulty breathing or shortness of breath. °· You develop chest pain. °· You have excessive dizziness or fainting. °Document Released: 12/26/2012 Document Revised: 03/12/2013 Document Reviewed: 12/26/2012 °ExitCare® Patient Information ©2015 [employer]. This information is not intended to replace advice given to you by your health care provider. Make sure you discuss any questions you have with your health care provider. ° °

## 2014-11-14 NOTE — H&P (Signed)
HPI [name] returns today for followup. She is a very pleasant 63-year-old woman with an ischemic cardiomyopathy, status post anterior MI, with a chronically occluded LAD. In the interim, she has been stable but has reached ERI on her ICD. She denies chest pain, shortness of breath, or peripheral edema. She has had no ICD shocks. Her heart failure symptoms are class II. The patient is bothered most by her inability to lose weight. She states that she is less active than she was as she is still working and also cleans her own house. No ICD shocks. She has not had an assessment of her LV function for many years.  Allergies  Allergen Reactions  . Codeine Nausea And Vomiting  . Crestor [Rosuvastatin]     Muscle aches  . Ecotrin [Aspirin]     BLEEDING ULCER - ONLY IN HIGH DOSES  . Lipitor [Atorvastatin]     Muscle aches  . Nsaids     BLEEDING ULCER  . Sulfa Drugs Cross Reactors Nausea And Vomiting  . Zocor [Simvastatin]     Muscle aches  . Penicillins Rash     Current Outpatient Prescriptions  Medication Sig Dispense Refill  . aspirin 81 MG tablet Take 81 mg by mouth daily.     . carvedilol (COREG) 25 MG tablet TAKE ONE & ONE-HALF TABLETS BY MOUTH TWICE DAILY 90 tablet 10  . cefdinir (OMNICEF) 300 MG capsule Take 300 mg by mouth 2 (two) times daily.  0  . cetirizine (ZYRTEC) 10 MG tablet Take 10 mg by mouth daily as needed for allergies.    . Cholecalciferol (VITAMIN D) 2000 UNITS CAPS Take 1 capsule (2,000 Units total) by mouth daily. 30 capsule   . citalopram (CELEXA) 40 MG tablet Take 1 tablet by mouth daily.    . ezetimibe (ZETIA) 10 MG tablet Take 1 tablet (10 mg total) by mouth daily. 30 tablet 6  . fexofenadine (ALLEGRA) 180 MG tablet Take 180 mg by mouth daily as needed for allergies or rhinitis.    . furosemide (LASIX) 40 MG tablet Take 1 tablet (40 mg total) by mouth daily. 90 tablet  3  . lisinopril (PRINIVIL,ZESTRIL) 20 MG tablet TAKE ONE TABLET BY MOUTH ONCE DAILY 30 tablet 11  . montelukast (SINGULAIR) 10 MG tablet Take 1 tablet by mouth daily.  0  . potassium chloride (K-DUR) 10 MEQ tablet Take 10 mEq by mouth daily.    . pravastatin (PRAVACHOL) 20 MG tablet TAKE ONE TABLET BY MOUTH IN THE EVENING 30 tablet 11   No current facility-administered medications for this visit.     Past Medical History  Diagnosis Date  . CAD (coronary artery disease)     CHRONICALLY OCCLUDED LAD WITH A LARGE ANTERIOR WALL MI   . CHF (congestive heart failure)     EF 30-40%  . ICD (implantable cardiac defibrillator), single, in situ     [employer]TRONIC  . Ischemic cardiomyopathy   . Myocardial infarction     ANTERIOR WALL  . Dyslipidemia     ROS:  All systems reviewed and negative except as noted in the HPI.   Past Surgical History  Procedure Laterality Date  . Cardiac catheterization  01/13/05, 06/29/99  . Cardiac defibrillator placement  02/21/05    SINGLE/MEDTRONIC     Family History  Problem Relation Age of Onset  . Hypertension Father   . Heart attack Mother   . Coronary artery disease Mother   . Aneurysm Brother        History   Social History  . Marital Status: Married    Spouse Name: N/A  . Number of Children: N/A  . Years of Education: N/A   Occupational History  . Not on file.   Social History Main Topics  . Smoking status: Never Smoker   . Smokeless tobacco: Never Used  . Alcohol Use: No  . Drug Use: Not on file  . Sexual Activity: Not on file   Other Topics Concern  . Not on file   Social History Narrative     BP 140/70 mmHg  Pulse 71  Ht 5' 3" (1.6 m)  Wt 244 lb 9.6 oz (110.95 kg)  BMI 43.34 kg/m2  Physical Exam:  Well appearing obese, middle-age woman,NAD HEENT: Unremarkable Neck: 6  cm JVD, no thyromegally Back: No CVA tenderness Lungs: Clear with no wheezes, rales, or rhonchi. HEART: Regular rate rhythm, no murmurs, no rubs, no clicks Abd: soft, positive bowel sounds, no organomegally, no rebound, no guarding Ext: 2 plus pulses, no edema, no cyanosis, no clubbing Skin: No rashes no nodules Neuro: CN II through XII intact, motor grossly intact   DEVICE  Normal device function. See PaceArt for details. ERI  Assess/Plan:            Chronic systolic congestive heart failure, NYHA class 2 - Mukesh Kornegay W Wanda Rideout, MD at 10/08/2014 1:32 PM     Status: Written Related Problem: Chronic systolic congestive heart failure, NYHA class 2   Expand All Collapse All   Her heart failure symptoms are stable, class 2. She has not had an assessment of her LV function for many years. I have recommended that she undergo 2-D echo. We'll make a decision about reimplantation of a new device based on the results of her echo. A separate question that has come up is regarding her 6949 [employer] lead. It is currently working normally but is a recall lead. In addition, it is known that the failure rate on these leads is very high early after device change out. For this reason we have recommended insertion of a new RV lead. I have given the patient an option as to whether to undergo extraction of the 6949-lead, or capping of this lead. She is reflection on this. Of course this would change our surgical procedure, specifically its location.            CAD (coronary artery disease) - Talley Kreiser W Koryn Charlot, MD at 10/08/2014 1:32 PM     Status: Written Related Problem: CAD (coronary artery disease)   Expand All Collapse All   She is status post MI with a chronically occluded LAD. She is currently free of angina. She will continue her current medications.            ICD (implantable cardioverter-defibrillator), single, in situ - Tanesha Arambula W [place], MD at 10/08/2014 1:33 PM     Status:  Written Related Problem: ICD (implantable cardioverter-defibrillator), single, in situ   Expand All Collapse All   She is presently at elective replacement. We will repeat her echo and make a decision about reimplantation based on the results of her 2-D echo.            Dyslipidemia - Trestan Vahle W Harlyn Italiano, MD at 10/08/2014 1:34 PM     Status: Written Related Problem: Dyslipidemia   Expand All Collapse All   She will continue her statin therapy. She is encouraged to lose weight.       EP Attending  Patient seen and examined.   Since her last visit, 2D echo demonstrated an EF of 35%. She has class 2 symptoms. She has reflected and would not like to have her RV ICD lead removed. I have discussed the risks/benefits/goals/expectations of the procedure discussed with the patient and she wishes to proceed.   Leonia Reeves.D.

## 2014-11-17 ENCOUNTER — Telehealth: Payer: Self-pay | Admitting: Internal Medicine

## 2014-11-17 ENCOUNTER — Encounter (HOSPITAL_COMMUNITY): Payer: Self-pay | Admitting: Internal Medicine

## 2014-11-17 NOTE — Telephone Encounter (Signed)
F/u   Pt need to change her surgery date because of her husband going to [facility] that day. Please call pt.

## 2014-11-17 NOTE — Telephone Encounter (Signed)
F/u ° ° °Pt returning your call °

## 2014-11-17 NOTE — Telephone Encounter (Signed)
Spoke with patient and will change to 9/19

## 2014-11-17 NOTE — Telephone Encounter (Signed)
New Message       Pt calling stating that Tresa Endo is supposed to be rescheduling her surgery and she states she wants it to be in the morning. Please call back and advise.

## 2014-11-17 NOTE — Telephone Encounter (Signed)
Spoke with patient and let her know I had not heard from Dr Ladona Ridgel in regards to her procedure.  He is going to have to move device to other side as her vein is blocked on side currently on.  We will temporarily schedule her for 12/02/14 at 7:30am  She is to be at the hospital at 5:30am.  I let her know I would discuss with Dr Ladona Ridgel tomorrow and call her back

## 2014-11-20 NOTE — Telephone Encounter (Signed)
Follow Up   Pt called states that she doesn't understand the defibrillator process. She doesn't understand why she has to have a Careers adviser? Please call to discuss.

## 2014-11-20 NOTE — Telephone Encounter (Signed)
Spoke with pt. Pt  said that she was worried because she thought that the lead was not going to be removed. After going over that the lead needed to be replaced and the surgeon need to be there as a precaution. Pt states that now she remember that Dr. Ladona Ridgel did mentioned it. pt said that she feels better about the procedure now.

## 2014-11-21 ENCOUNTER — Other Ambulatory Visit: Payer: Self-pay | Admitting: *Deleted

## 2014-11-21 DIAGNOSIS — I255 Ischemic cardiomyopathy: Secondary | ICD-10-CM

## 2014-11-21 NOTE — Telephone Encounter (Signed)
Discussed with Dr Ladona Ridgel and he is going to turn off the device on the left side and implant a new device on right side.  When I called the patient back, this is how [name] understood things.  [name] is scheduled for 12/08/14 at 9:30am.  [name] is aware to be at t he hospital at 7:30am  NPO after midnight the night before

## 2014-12-07 MED ORDER — VANCOMYCIN HCL IN DEXTROSE 1-5 GM/200ML-% IV SOLN
1000.0000 mg | INTRAVENOUS | Status: DC
  Filled 2014-12-07: qty 200

## 2014-12-07 MED ORDER — VANCOMYCIN HCL 10 G IV SOLR: 1500.0000 mg | INTRAVENOUS | Status: DC

## 2014-12-08 ENCOUNTER — Ambulatory Visit (HOSPITAL_COMMUNITY)
Admission: RE | Admit: 2014-12-08 | Discharge: 2014-12-09 | Disposition: A | Discharge status: Home / Self Care | Payer: Medicare Other | Source: Ambulatory Visit | Attending: Internal Medicine | Admitting: Internal Medicine

## 2014-12-08 ENCOUNTER — Encounter (HOSPITAL_COMMUNITY): Payer: Self-pay | Admitting: Internal Medicine

## 2014-12-08 ENCOUNTER — Encounter (HOSPITAL_COMMUNITY)
Admission: RE | Disposition: A | Discharge status: Home / Self Care | Payer: BLUE CROSS/BLUE SHIELD | Source: Ambulatory Visit | Attending: Internal Medicine

## 2014-12-08 DIAGNOSIS — I2582 Chronic total occlusion of coronary artery: Secondary | ICD-10-CM | POA: Insufficient documentation

## 2014-12-08 DIAGNOSIS — Z95810 Presence of automatic (implantable) cardiac defibrillator: Secondary | ICD-10-CM | POA: Diagnosis present

## 2014-12-08 DIAGNOSIS — I252 Old myocardial infarction: Secondary | ICD-10-CM | POA: Insufficient documentation

## 2014-12-08 DIAGNOSIS — I2510 Atherosclerotic heart disease of native coronary artery without angina pectoris: Secondary | ICD-10-CM | POA: Insufficient documentation

## 2014-12-08 DIAGNOSIS — E785 Hyperlipidemia, unspecified: Secondary | ICD-10-CM | POA: Diagnosis not present

## 2014-12-08 DIAGNOSIS — Z23 Encounter for immunization: Secondary | ICD-10-CM | POA: Insufficient documentation

## 2014-12-08 DIAGNOSIS — I255 Ischemic cardiomyopathy: Secondary | ICD-10-CM | POA: Diagnosis not present

## 2014-12-08 HISTORY — DX: Essential (primary) hypertension: I10

## 2014-12-08 HISTORY — DX: Presence of automatic (implantable) cardiac defibrillator: Z95.810

## 2014-12-08 HISTORY — PX: EP IMPLANTABLE DEVICE: SHX172B

## 2014-12-08 LAB — PROTIME-INR
INR: 1.11 (ref 0.00–1.49)
Prothrombin Time: 14.5 seconds (ref 11.6–15.2)

## 2014-12-08 LAB — CBC
HEMATOCRIT: 42 % (ref 36.0–46.0)
HEMOGLOBIN: 13.9 g/dL (ref 12.0–15.0)
MCH: 31.5 pg (ref 26.0–34.0)
MCHC: 33.1 g/dL (ref 30.0–36.0)
MCV: 95.2 fL (ref 78.0–100.0)
Platelets: 214 10*3/uL (ref 150–400)
RBC: 4.41 MIL/uL (ref 3.87–5.11)
RDW: 13.8 % (ref 11.5–15.5)
WBC: 7.6 10*3/uL (ref 4.0–10.5)

## 2014-12-08 LAB — BASIC METABOLIC PANEL
Anion gap: 6 (ref 5–15)
BUN: 12 mg/dL (ref 6–20)
CO2: 30 mmol/L (ref 22–32)
Calcium: 9.1 mg/dL (ref 8.9–10.3)
Chloride: 104 mmol/L (ref 101–111)
Creatinine, Ser: 0.88 mg/dL (ref 0.44–1.00)
GFR calc Af Amer: >60 mL/min (ref >60)
GFR calc non Af Amer: >60 mL/min (ref >60)
Glucose, Bld: 99 mg/dL (ref 65–99)
Potassium: 4.3 mmol/L (ref 3.5–5.1)
Sodium: 140 mmol/L (ref 135–145)

## 2014-12-08 SURGERY — ICD IMPLANT
Anesthesia: LOCAL

## 2014-12-08 MED ORDER — MIDAZOLAM HCL 5 MG/5ML IJ SOLN
INTRAMUSCULAR | Status: DC | PRN
Start: 2014-12-08 — End: 2014-12-08
  Administered 2014-12-08 (×7): 1 mg via INTRAVENOUS
  Administered 2014-12-08: 2 mg via INTRAVENOUS

## 2014-12-08 MED ORDER — FENTANYL CITRATE (PF) 100 MCG/2ML IJ SOLN
INTRAMUSCULAR | Status: AC
  Filled 2014-12-08: qty 4

## 2014-12-08 MED ORDER — PNEUMOCOCCAL VAC POLYVALENT 25 MCG/0.5ML IJ INJ
0.5000 mL | INJECTION | INTRAMUSCULAR | Status: AC
  Administered 2014-12-09: 11:00:00 0.5 mL via INTRAMUSCULAR
  Filled 2014-12-08: qty 0.5

## 2014-12-08 MED ORDER — CITALOPRAM HYDROBROMIDE 40 MG PO TABS
40.0000 mg | ORAL_TABLET | Freq: Every day | ORAL | Status: DC
  Administered 2014-12-09: 40 mg via ORAL
  Filled 2014-12-08: qty 1

## 2014-12-08 MED ORDER — MONTELUKAST SODIUM 10 MG PO TABS
10.0000 mg | ORAL_TABLET | Freq: Every day | ORAL | Status: DC
  Administered 2014-12-09: 10 mg via ORAL
  Filled 2014-12-08: qty 1

## 2014-12-08 MED ORDER — LIDOCAINE HCL (PF) 1 % IJ SOLN
INTRAMUSCULAR | Status: DC | PRN
  Administered 2014-12-08: 60 mL via INTRADERMAL

## 2014-12-08 MED ORDER — SODIUM CHLORIDE 0.9 % IV SOLN
INTRAVENOUS | Status: DC
  Administered 2014-12-08: 09:00:00 via INTRAVENOUS

## 2014-12-08 MED ORDER — SODIUM CHLORIDE 0.9 % IR SOLN
Status: AC
  Filled 2014-12-08: qty 2

## 2014-12-08 MED ORDER — LISINOPRIL 10 MG PO TABS
20.0000 mg | ORAL_TABLET | Freq: Every day | ORAL | Status: DC
  Administered 2014-12-09: 20 mg via ORAL
  Filled 2014-12-08: qty 2

## 2014-12-08 MED ORDER — VANCOMYCIN HCL 10 G IV SOLR
1500.0000 mg | INTRAVENOUS | Status: AC
  Administered 2014-12-08: 1500 mg via INTRAVENOUS
  Filled 2014-12-08: qty 1500

## 2014-12-08 MED ORDER — FENTANYL CITRATE (PF) 100 MCG/2ML IJ SOLN
INTRAMUSCULAR | Status: DC | PRN
  Administered 2014-12-08: 25 ug via INTRAVENOUS
  Administered 2014-12-08 (×3): 12.5 ug via INTRAVENOUS
  Administered 2014-12-08: 25 ug via INTRAVENOUS
  Administered 2014-12-08: 12.5 ug via INTRAVENOUS

## 2014-12-08 MED ORDER — MIDAZOLAM HCL 5 MG/5ML IJ SOLN
INTRAMUSCULAR | Status: AC
  Filled 2014-12-08: qty 25

## 2014-12-08 MED ORDER — LIDOCAINE HCL (PF) 1 % IJ SOLN
INTRAMUSCULAR | Status: AC
  Filled 2014-12-08: qty 30

## 2014-12-08 MED ORDER — ACETAMINOPHEN 325 MG PO TABS
325.0000 mg | ORAL_TABLET | ORAL | Status: DC | PRN
Start: 2014-12-08 — End: 2014-12-09

## 2014-12-08 MED ORDER — CHLORHEXIDINE GLUCONATE 4 % EX LIQD
60.0000 mL | Freq: Once | CUTANEOUS | Status: DC
  Filled 2014-12-08: qty 60

## 2014-12-08 MED ORDER — HYDROCODONE-ACETAMINOPHEN 5-325 MG PO TABS
1.0000 | ORAL_TABLET | ORAL | Status: DC | PRN
  Administered 2014-12-08: 2 via ORAL
  Administered 2014-12-08: 16:00:00 1 via ORAL
  Administered 2014-12-09: 06:00:00 2 via ORAL
  Filled 2014-12-08: qty 1
  Filled 2014-12-08 (×2): qty 2

## 2014-12-08 MED ORDER — CARVEDILOL 12.5 MG PO TABS
37.5000 mg | ORAL_TABLET | Freq: Two times a day (BID) | ORAL | Status: DC
  Administered 2014-12-08 – 2014-12-09 (×2): 37.5 mg via ORAL
  Filled 2014-12-08 (×2): qty 3

## 2014-12-08 MED ORDER — VANCOMYCIN HCL IN DEXTROSE 1-5 GM/200ML-% IV SOLN
1000.0000 mg | Freq: Two times a day (BID) | INTRAVENOUS | Status: AC
  Administered 2014-12-08: 1000 mg via INTRAVENOUS
  Filled 2014-12-08: qty 200

## 2014-12-08 MED ORDER — FUROSEMIDE 40 MG PO TABS
40.0000 mg | ORAL_TABLET | Freq: Every day | ORAL | Status: DC
  Administered 2014-12-08 – 2014-12-09 (×2): 40 mg via ORAL
  Filled 2014-12-08 (×2): qty 1

## 2014-12-08 MED ORDER — INFLUENZA VAC SPLIT QUAD 0.5 ML IM SUSY
0.5000 mL | PREFILLED_SYRINGE | INTRAMUSCULAR | Status: AC
  Administered 2014-12-09: 11:00:00 0.5 mL via INTRAMUSCULAR
  Filled 2014-12-08: qty 0.5

## 2014-12-08 MED ORDER — MUPIROCIN 2 % EX OINT
TOPICAL_OINTMENT | CUTANEOUS | Status: AC
  Filled 2014-12-08: qty 22

## 2014-12-08 MED ORDER — POTASSIUM CHLORIDE CRYS ER 10 MEQ PO TBCR
10.0000 meq | EXTENDED_RELEASE_TABLET | Freq: Two times a day (BID) | ORAL | Status: DC
  Administered 2014-12-08 – 2014-12-09 (×2): 10 meq via ORAL
  Filled 2014-12-08 (×2): qty 1

## 2014-12-08 MED ORDER — ONDANSETRON HCL 4 MG/2ML IJ SOLN: 4.0000 mg | Freq: Four times a day (QID) | INTRAMUSCULAR | Status: DC | PRN

## 2014-12-08 MED ORDER — SODIUM CHLORIDE 0.9 % IR SOLN
80.0000 mg | Status: AC
  Administered 2014-12-08: 80 mg

## 2014-12-08 MED ORDER — HEPARIN (PORCINE) IN NACL 2-0.9 UNIT/ML-% IJ SOLN
INTRAMUSCULAR | Status: DC | PRN
  Administered 2014-12-08: 10:00:00

## 2014-12-08 MED ORDER — ZOLPIDEM TARTRATE 5 MG PO TABS
5.0000 mg | ORAL_TABLET | Freq: Every evening | ORAL | Status: DC | PRN
  Administered 2014-12-08: 5 mg via ORAL
  Filled 2014-12-08: qty 1

## 2014-12-08 SURGICAL SUPPLY — 6 items
CABLE SURGICAL S-101-97-12 (CABLE) ×2 IMPLANT
ICD EVERA XT VR DVBB1D1 (ICD Generator) ×2 IMPLANT
LEAD SPRINT QUAT SEC 6935-58CM (Lead) ×2 IMPLANT
PAD DEFIB LIFELINK (PAD) ×2 IMPLANT
SHEATH CLASSIC 9F (SHEATH) ×2 IMPLANT
TRAY PACEMAKER INSERTION (CUSTOM PROCEDURE TRAY) ×2 IMPLANT

## 2014-12-08 NOTE — H&P (View-Only) (Signed)
HPI Mrs. Gavel returns today for followup. She is a very pleasant 64 year old woman with an ischemic cardiomyopathy, status post anterior MI, with a chronically occluded LAD. In the interim, she has been stable but has reached ERI on her ICD. She denies chest pain, shortness of breath, or peripheral edema. She has had no ICD shocks. Her heart failure symptoms are class II. The patient is bothered most by her inability to lose weight. She states that she is less active than she was as she is still working and also cleans her own house. No ICD shocks. She has not had an assessment of her LV function for many years.  Allergies  Allergen Reactions  . Codeine Nausea And Vomiting  . Crestor [Rosuvastatin]     Muscle aches  . Ecotrin [Aspirin]     BLEEDING ULCER - ONLY IN HIGH DOSES  . Lipitor [Atorvastatin]     Muscle aches  . Nsaids     BLEEDING ULCER  . Sulfa Drugs Cross Reactors Nausea And Vomiting  . Zocor [Simvastatin]     Muscle aches  . Penicillins Rash     Current Outpatient Prescriptions  Medication Sig Dispense Refill  . aspirin 81 MG tablet Take 81 mg by mouth daily.     . carvedilol (COREG) 25 MG tablet TAKE ONE & ONE-HALF TABLETS BY MOUTH TWICE DAILY 90 tablet 10  . cefdinir (OMNICEF) 300 MG capsule Take 300 mg by mouth 2 (two) times daily.  0  . cetirizine (ZYRTEC) 10 MG tablet Take 10 mg by mouth daily as needed for allergies.    . Cholecalciferol (VITAMIN D) 2000 UNITS CAPS Take 1 capsule (2,000 Units total) by mouth daily. 30 capsule   . citalopram (CELEXA) 40 MG tablet Take 1 tablet by mouth daily.    Marland Kitchen ezetimibe (ZETIA) 10 MG tablet Take 1 tablet (10 mg total) by mouth daily. 30 tablet 6  . fexofenadine (ALLEGRA) 180 MG tablet Take 180 mg by mouth daily as needed for allergies or rhinitis.    . furosemide (LASIX) 40 MG tablet Take 1 tablet (40 mg total) by mouth daily. 90 tablet  3  . lisinopril (PRINIVIL,ZESTRIL) 20 MG tablet TAKE ONE TABLET BY MOUTH ONCE DAILY 30 tablet 11  . montelukast (SINGULAIR) 10 MG tablet Take 1 tablet by mouth daily.  0  . potassium chloride (K-DUR) 10 MEQ tablet Take 10 mEq by mouth daily.    . pravastatin (PRAVACHOL) 20 MG tablet TAKE ONE TABLET BY MOUTH IN THE EVENING 30 tablet 11   No current facility-administered medications for this visit.     Past Medical History  Diagnosis Date  . CAD (coronary artery disease)     CHRONICALLY OCCLUDED LAD WITH A LARGE ANTERIOR WALL MI   . CHF (congestive heart failure)     EF 30-40%  . ICD (implantable cardiac defibrillator), single, in situ     [employer]  . Ischemic cardiomyopathy   . Myocardial infarction     ANTERIOR WALL  . Dyslipidemia     ROS:  All systems reviewed and negative except as noted in the HPI.   Past Surgical History  Procedure Laterality Date  . Cardiac catheterization  01/13/05, 06/29/99  . Cardiac defibrillator placement  02/21/05    SINGLE/MEDTRONIC     Family History  Problem Relation Age of Onset  . Hypertension Father   . Heart attack Mother   . Coronary artery disease Mother   . Aneurysm Brother  History   Social History  . Marital Status: Married    Spouse Name: N/A  . Number of Children: N/A  . Years of Education: N/A   Occupational History  . Not on file.   Social History Main Topics  . Smoking status: Never Smoker   . Smokeless tobacco: Never Used  . Alcohol Use: No  . Drug Use: Not on file  . Sexual Activity: Not on file   Other Topics Concern  . Not on file   Social History Narrative     BP 140/70 mmHg  Pulse 71  Ht  (1.6 m)  Wt 244 lb 9.6 oz (110.95 kg)  BMI 43.34 kg/m2  Physical Exam:  Well appearing obese, middle-age woman,NAD HEENT: Unremarkable Neck: 6  cm JVD, no thyromegally Back: No CVA tenderness Lungs: Clear with no wheezes, rales, or rhonchi. HEART: Regular rate rhythm, no murmurs, no rubs, no clicks Abd: soft, positive bowel sounds, no organomegally, no rebound, no guarding Ext: 2 plus pulses, no edema, no cyanosis, no clubbing Skin: No rashes no nodules Neuro: CN II through XII intact, motor grossly intact   DEVICE  Normal device function. See PaceArt for details. ERI  Assess/Plan:            Chronic systolic congestive heart failure, NYHA class 2 - Marinus Maw, MD at 10/08/2014 1:32 PM     Status: Written Related Problem: Chronic systolic congestive heart failure, NYHA class 2   Expand All Collapse All   Her heart failure symptoms are stable, class 2. She has not had an assessment of her LV function for many years. I have recommended that she undergo 2-D echo. We'll make a decision about reimplantation of a new device based on the results of her echo. A separate question that has come up is regarding her 6949 [employer] lead. It is currently working normally but is a recall lead. In addition, it is known that the failure rate on these leads is very high early after device change out. For this reason we have recommended insertion of a new RV lead. I have given the patient an option as to whether to undergo extraction of the 6949-lead, or capping of this lead. She is reflection on this. Of course this would change our surgical procedure, specifically its location.            CAD (coronary artery disease) - Marinus Maw, MD at 10/08/2014 1:32 PM     Status: Written Related Problem: CAD (coronary artery disease)   Expand All Collapse All   She is status post MI with a chronically occluded LAD. She is currently free of angina. She will continue her current medications.            ICD (implantable cardioverter-defibrillator), single, in situ - Marinus Maw, MD at 10/08/2014 1:33 PM     Status:  Written Related Problem: ICD (implantable cardioverter-defibrillator), single, in situ   Expand All Collapse All   She is presently at elective replacement. We will repeat her echo and make a decision about reimplantation based on the results of her 2-D echo.            Dyslipidemia - Marinus Maw, MD at 10/08/2014 1:34 PM     Status: Written Related Problem: Dyslipidemia   Expand All Collapse All   She will continue her statin therapy. She is encouraged to lose weight.       EP Attending  Patient seen and examined.  Since her last visit, 2D echo demonstrated an EF of 35%. She has class 2 symptoms. She has reflected and would not like to have her RV ICD lead removed. I have discussed the risks/benefits/goals/expectations of the procedure discussed with the patient and she wishes to proceed.   Leonia Reeves.D.

## 2014-12-08 NOTE — Interval H&P Note (Signed)
History and Physical Interval Note:  12/08/2014 9:26 AM  [name]  has presented today for surgery, with the diagnosis of ischemic cardiomyopathy/ ICD malfunction  The various methods of treatment have been discussed with the patient and family. After consideration of risks, benefits and other options for treatment, the patient has consented to  Procedure(s): ICD Implant (N/A) as a surgical intervention .  The patient's history has been reviewed, patient examined, no change in status, stable for surgery.  I have reviewed the patient's chart and labs.  Questions were answered to the patient's satisfaction.  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 Will plan to place a new ICD on the contralateral side. We will not plan to remove generator on left unless required.   Gregg Taylor,M.D.  [name]

## 2014-12-09 ENCOUNTER — Ambulatory Visit (HOSPITAL_COMMUNITY): Payer: Medicare Other

## 2014-12-09 DIAGNOSIS — Z95810 Presence of automatic (implantable) cardiac defibrillator: Secondary | ICD-10-CM | POA: Diagnosis not present

## 2014-12-09 DIAGNOSIS — I252 Old myocardial infarction: Secondary | ICD-10-CM | POA: Diagnosis not present

## 2014-12-09 DIAGNOSIS — I2510 Atherosclerotic heart disease of native coronary artery without angina pectoris: Secondary | ICD-10-CM | POA: Diagnosis not present

## 2014-12-09 DIAGNOSIS — Z23 Encounter for immunization: Secondary | ICD-10-CM | POA: Diagnosis not present

## 2014-12-09 DIAGNOSIS — I255 Ischemic cardiomyopathy: Secondary | ICD-10-CM | POA: Diagnosis not present

## 2014-12-09 MED ORDER — YOU HAVE A PACEMAKER BOOK
Freq: Once | Status: AC
  Administered 2014-12-09: 11:00:00
  Filled 2014-12-09: qty 1

## 2014-12-09 MED ORDER — HYDROCODONE-ACETAMINOPHEN 5-325 MG PO TABS: 1.0000 | ORAL_TABLET | ORAL | Status: DC | PRN

## 2014-12-09 MED FILL — Gentamicin Sulfate Inj 40 MG/ML: INTRAMUSCULAR | Qty: 80 | Status: AC

## 2014-12-09 NOTE — Discharge Summary (Signed)
ELECTROPHYSIOLOGY PROCEDURE DISCHARGE SUMMARY    Patient ID: [name],  MRN: [id number], DOB/AGE: 08/07/1950 64 y.o.  Admit date: 12/08/2014 Discharge date: 12/09/2014  Primary Care Physician: Deloris Ping, MD Primary Cardiologist: [name] Electrophysiologist: Ladona Ridgel  Primary Discharge Diagnosis:  Ischemic cardiomyopathy with previously implanted left sided ICD system with RV lead on manufacturer recall s/p new right sided ICD system implant this admission  Secondary Discharge Diagnosis:  1.  CAD with chronically occluded LAD 2.  Dyslipidemia 3.  Chronic systolic heart failure 4.  Occluded left subclavian vein   Allergies  Allergen Reactions  . Codeine Nausea And Vomiting  . Crestor [Rosuvastatin] Other (See Comments)    Muscle aches  . Ecotrin [Aspirin] Other (See Comments)    BLEEDING ULCER - ONLY IN HIGH DOSES  . Lipitor [Atorvastatin] Other (See Comments)    Muscle aches  . Nsaids Other (See Comments)    BLEEDING ULCER  . Penicillins Rash  . Sulfa Drugs Cross Reactors Nausea And Vomiting  . Vitamin C Other (See Comments)    Upsets stomach  . Zocor [Simvastatin] Other (See Comments)    Muscle aches     Procedures This Admission:  1.  Implantation of a MDT single chamber ICD on 12/08/14 by Dr Ladona Ridgel.  See op note for full details.  DFT's were deferred at time of implant.  There were no immediate post procedure complications. 2.  CXR on 12/09/14 demonstrated no pneumothorax status post device implantation.   Brief HPI: [name] is a 64 y.o. female with a past medical history of CAD and ICM. Her previously implanted ICD had reached elective replacement indicator and her RV lead was on manufacturer recall.  Attempts at addition of RV lead from the left side were unsuccessful 2/2 occluded subclavian vein.  Alternatives including lead extraction vs new implant on the right side were discussed with the patient. The patient opted for new right sided device  implant.  Risks, benefits, and alternatives to ICD implantation were reviewed with the patient who wished to proceed.   Hospital Course:  The patient was admitted and underwent implantation of a MDT single chamber ICD with details as outlined above. She was monitored on telemetry overnight which demonstrated sinus rhythm.  Left chest was without hematoma or ecchymosis.  The device was interrogated and found to be functioning normally.  CXR was obtained and demonstrated no pneumothorax status post device implantation.  Wound care, arm mobility, and restrictions were reviewed with the patient.  The patient was examined and considered stable for discharge to home.   The patient's discharge medications include an ACE-I (Lisinopril) and beta blocker (Coreg).   Physical Exam: Filed Vitals:   12/08/14 1600 12/08/14 1815 12/08/14 1948 12/09/14 0525  BP: 109/55 107/46 97/41 128/74  Pulse: 67  71 69  Temp: 98 F (36.7 C)  97.9 F (36.6 C) 97.9 F (36.6 C)  TempSrc: Oral  Oral Oral  Resp: Height:      Weight:      SpO2: 96%  96% 79%    GEN- The patient is well appearing, alert and oriented x 3 today.   HEENT: normocephalic, atraumatic; sclera clear, conjunctiva pink; hearing intact; oropharynx clear; neck supple  Lungs- Clear to ausculation bilaterally, normal work of breathing.  No wheezes, rales, rhonchi Heart- Regular rate and rhythm  GI- soft, non-tender, non-distended, bowel sounds present  Extremities- no clubbing, cyanosis, or edema  MS- no significant deformity or  atrophy Skin- warm and dry, no rash or lesion, left chest without hematoma/ecchymosis Psych- euthymic mood, full affect Neuro- strength and sensation are intact   Labs:   Lab Results  Component Value Date   WBC 7.6 12/08/2014   HGB 13.9 12/08/2014   HCT 42.0 12/08/2014   MCV 95.2 12/08/2014   PLT 214 12/08/2014     Recent Labs Lab 12/08/14 0815  NA 140  K 4.3  CL 104  CO2 30  BUN 12    CREATININE 0.88  CALCIUM 9.1  GLUCOSE 99    Discharge Medications:    Medication List    TAKE these medications        aspirin EC 81 MG tablet  Take 81 mg by mouth daily.     carvedilol 25 MG tablet  Commonly known as:  COREG  TAKE ONE & ONE-HALF TABLETS BY MOUTH TWICE DAILY     cetirizine 10 MG tablet  Commonly known as:  ZYRTEC  Take 10 mg by mouth daily as needed for allergies.     citalopram 40 MG tablet  Commonly known as:  CELEXA  Take 40 mg by mouth daily.     ezetimibe 10 MG tablet  Commonly known as:  ZETIA  Take 1 tablet (10 mg total) by mouth daily.     fexofenadine 180 MG tablet  Commonly known as:  ALLEGRA  Take 180 mg by mouth daily as needed for allergies or rhinitis.     furosemide 40 MG tablet  Commonly known as:  LASIX  Take 1 tablet (40 mg total) by mouth daily.     HYDROcodone-acetaminophen 5-325 MG per tablet  Commonly known as:  NORCO/VICODIN  Take 1-2 tablets by mouth every 4 (four) hours as needed for moderate pain.     KLOR-CON M10 10 MEQ tablet  Generic drug:  potassium chloride  TAKE ONE TABLET BY MOUTH TWICE DAILY     lisinopril 20 MG tablet  Commonly known as:  PRINIVIL,ZESTRIL  TAKE ONE TABLET BY MOUTH ONCE DAILY     montelukast 10 MG tablet  Commonly known as:  SINGULAIR  Take 10 mg by mouth daily.     pravastatin 20 MG tablet  Commonly known as:  PRAVACHOL  TAKE ONE TABLET BY MOUTH IN THE EVENING     Vitamin D 2000 UNITS Caps  Take 1 capsule (2,000 Units total) by mouth daily.        Disposition:  Discharge Instructions    Diet - low sodium heart healthy    Complete by:  As directed      Increase activity slowly    Complete by:  As directed           Follow-up Information    Follow up with [facility] On 12/18/2014.   Why:  at 11AM for wound check   Contact information:   [address] [place] [address]       Follow up with Lewayne Bunting, MD On 03/10/2015.    Specialty:  Cardiology   Why:  at 8:45AM   Contact information:   [address] [place] [address] [phone]       Duration of Discharge Encounter: Greater than 30 minutes including physician time.  Signed, Gypsy Balsam, NP 12/09/2014 7:53 AM  EP Attending  Patient seen and examined. Agree with above. Her new ICD is working normally. Will dc home. Usual followup.   Leonia Reeves.D.

## 2014-12-09 NOTE — Discharge Instructions (Signed)
° ° °  Supplemental Discharge Instructions for  °Pacemaker/Defibrillator Patients ° °Activity °No heavy lifting or vigorous activity with your left/right arm for 6 to 8 weeks.  Do not raise your left/right arm above your head for one week.  Gradually raise your affected arm as drawn below. ° °        ° °__        12/13/14                    12/14/14                    12/15/14                    12/16/14 ° °NO DRIVING for   1 week    ° °WOUND CARE °- Keep the wound area clean and dry.  Do not get this area wet for one week. No showers for one week; you may shower on  12/16/14   . °- The tape/steri-strips on your wound will fall off; do not pull them off.  No bandage is needed on the site.  DO  NOT apply any creams, oils, or ointments to the wound area. °- If you notice any drainage or discharge from the wound, any swelling or bruising at the site, or you develop a fever > 101? F after you are discharged home, call the office at once. ° °Special Instructions °- You are still able to use cellular telephones; use the ear opposite the side where you have your pacemaker/defibrillator.  Avoid carrying your cellular phone near your device. °- When traveling through airports, show security personnel your identification card to avoid being screened in the metal detectors.  Ask the security personnel to use the hand wand. °- Avoid arc welding equipment, MRI testing (magnetic resonance imaging), TENS units (transcutaneous nerve stimulators).  Call the office for questions about other devices. °- Avoid electrical appliances that are in poor condition or are not properly grounded. °- Microwave ovens are safe to be near or to operate. ° °Additional information for defibrillator patients should your device go off: °- If your device goes off ONCE and you feel fine afterward, notify the device clinic nurses. °- If your device goes off ONCE and you do not feel well afterward, call 911. °- If your device goes off TWICE, call 911. °- If your  device goes off THREE times in one day, call 911. ° °DO NOT DRIVE YOURSELF OR A FAMILY MEMBER °WITH A DEFIBRILLATOR TO THE HOSPITAL--CALL 911. ° °

## 2014-12-09 NOTE — Progress Notes (Signed)
Patient ID: [name], female   DOB: 1950-11-10, 64 y.o.   MRN: [id number] ICD Criteria  Current LVEF:35%. Within 12 months prior to implant: Yes   Heart failure history: Yes, Class II  Cardiomyopathy history: Yes, Ischemic Cardiomyopathy.  Atrial Fibrillation/Atrial Flutter: No.  Ventricular tachycardia history: No.  Cardiac arrest history: No.  History of syndromes with risk of sudden death: No.  Previous ICD: Yes, Reason for ICD:  Primary prevention.  Current ICD indication: Primary  PPM indication: No.   Class I or II Bradycardia indication present: No  Beta Blocker therapy for 3 or more months: Yes, prescribed.   Ace Inhibitor/ARB therapy for 3 or more months: Yes, prescribed.

## 2014-12-18 ENCOUNTER — Ambulatory Visit (INDEPENDENT_AMBULATORY_CARE_PROVIDER_SITE_OTHER): Payer: Medicare Other | Admitting: *Deleted

## 2014-12-18 DIAGNOSIS — I5022 Chronic systolic (congestive) heart failure: Secondary | ICD-10-CM | POA: Diagnosis not present

## 2014-12-18 DIAGNOSIS — Z95810 Presence of automatic (implantable) cardiac defibrillator: Secondary | ICD-10-CM

## 2014-12-18 DIAGNOSIS — I255 Ischemic cardiomyopathy: Secondary | ICD-10-CM

## 2014-12-18 LAB — CUP PACEART INCLINIC DEVICE CHECK
Brady Statistic RV Percent Paced: 0.01 %
Date Time Interrogation Session: 20160929125852
HIGH POWER IMPEDANCE MEASURED VALUE: 171 Ohm
HIGH POWER IMPEDANCE MEASURED VALUE: 61 Ohm
Lead Channel Impedance Value: 475 Ohm
Lead Channel Pacing Threshold Amplitude: 0.625 V
Lead Channel Sensing Intrinsic Amplitude: 10.5 mV
Lead Channel Setting Pacing Amplitude: 3.5 V
MDC IDC MSMT BATTERY REMAINING LONGEVITY: 137 mo
MDC IDC MSMT BATTERY VOLTAGE: 3.11 V
MDC IDC MSMT LEADCHNL RV PACING THRESHOLD PULSEWIDTH: 0.4 ms
MDC IDC MSMT LEADCHNL RV SENSING INTR AMPL: 11.25 mV
MDC IDC SET LEADCHNL RV PACING PULSEWIDTH: 0.4 ms
MDC IDC SET LEADCHNL RV SENSING SENSITIVITY: 0.3 mV
MDC IDC SET ZONE DETECTION INTERVAL: 280 ms
MDC IDC SET ZONE DETECTION INTERVAL: 340 ms
MDC IDC SET ZONE DETECTION INTERVAL: 340 ms

## 2014-12-18 NOTE — Progress Notes (Signed)
Changeout/revision wound check appointment. Steri-strips removed. Wound without redness or edema. Incision edges approximated, wound well healed. Normal device function. Threshold, sensing, and impedances consistent with implant measurements. Device programmed at 3.5V for extra safety margin until 3 month visit. Histogram distribution appropriate for patient and level of activity. No ventricular arrhythmias noted. Patient educated about wound care, arm mobility, lifting restrictions, shock plan. ROV w/ GT 03/10/15.

## 2014-12-23 ENCOUNTER — Other Ambulatory Visit: Payer: Self-pay | Admitting: Internal Medicine

## 2015-01-05 ENCOUNTER — Other Ambulatory Visit: Payer: Self-pay | Admitting: Internal Medicine

## 2015-01-06 ENCOUNTER — Encounter: Payer: Self-pay | Admitting: Internal Medicine

## 2015-02-05 ENCOUNTER — Other Ambulatory Visit: Payer: Self-pay | Admitting: Cardiovascular Disease

## 2015-02-05 NOTE — Telephone Encounter (Signed)
She can take once daily as she has been taking.  Thank you

## 2015-02-05 NOTE — Telephone Encounter (Signed)
She may continue with 1 pill daily

## 2015-02-05 NOTE — Telephone Encounter (Signed)
Should this be qd or bid? Please advise. Thanks, MI 

## 2015-02-20 ENCOUNTER — Other Ambulatory Visit: Payer: Self-pay | Admitting: Internal Medicine

## 2015-03-10 ENCOUNTER — Ambulatory Visit (INDEPENDENT_AMBULATORY_CARE_PROVIDER_SITE_OTHER): Payer: Medicare Other | Admitting: Internal Medicine

## 2015-03-10 ENCOUNTER — Encounter: Payer: Self-pay | Admitting: Internal Medicine

## 2015-03-10 VITALS — BP 120/84 | HR 73 | Ht 62.0 in | Wt 245.2 lb

## 2015-03-10 DIAGNOSIS — I2510 Atherosclerotic heart disease of native coronary artery without angina pectoris: Secondary | ICD-10-CM

## 2015-03-10 DIAGNOSIS — I5022 Chronic systolic (congestive) heart failure: Secondary | ICD-10-CM

## 2015-03-10 DIAGNOSIS — Z95810 Presence of automatic (implantable) cardiac defibrillator: Secondary | ICD-10-CM

## 2015-03-10 DIAGNOSIS — I2583 Coronary atherosclerosis due to lipid rich plaque: Secondary | ICD-10-CM

## 2015-03-10 LAB — CUP PACEART INCLINIC DEVICE CHECK
Battery Remaining Longevity: 136 mo
Date Time Interrogation Session: 20161220144903
HIGH POWER IMPEDANCE MEASURED VALUE: 76 Ohm
Implantable Lead Implant Date: 20160919
Implantable Lead Location: 753860
Lead Channel Impedance Value: 456 Ohm
Lead Channel Pacing Threshold Pulse Width: 0.4 ms
Lead Channel Setting Sensing Sensitivity: 0.3 mV
MDC IDC LEAD MODEL: 6935
MDC IDC MSMT BATTERY VOLTAGE: 3.12 V
MDC IDC MSMT LEADCHNL RV IMPEDANCE VALUE: 532 Ohm
MDC IDC MSMT LEADCHNL RV PACING THRESHOLD AMPLITUDE: 0.625 V
MDC IDC MSMT LEADCHNL RV SENSING INTR AMPL: 14.4 mV
MDC IDC SET LEADCHNL RV PACING AMPLITUDE: 2 V
MDC IDC SET LEADCHNL RV PACING PULSEWIDTH: 0.4 ms
MDC IDC STAT BRADY RV PERCENT PACED: 0.01 %

## 2015-03-10 NOTE — Patient Instructions (Signed)
Medication Instructions:  Your physician recommends that you continue on your current medications as directed. Please refer to the Current Medication list given to you today.   Labwork: N/A  Testing/Procedures: N/A  Follow-Up: Your physician wants you to follow-up in: September 2017.  You will receive a reminder letter in the mail two months in advance. If you don't receive a letter, please call our office to schedule the follow-up appointment.  Remote monitoring is used to monitor your ICD from home. This monitoring reduces the number of office visits required to check your device to one time per year. It allows us to keep an eye on the functioning of your device to ensure it is working properly. You are scheduled for a device check from home on 06/09/15. You may send your transmission at any time that day. If you have a wireless device, the transmission will be sent automatically. After your physician reviews your transmission, you will receive a postcard with your next transmission date.    Any Other Special Instructions Will Be Listed Below (If Applicable).   If you need a refill on your cardiac medications before your next appointment, please call your pharmacy.

## 2015-03-10 NOTE — Progress Notes (Signed)
HPI [name] returns today for followup. She is a very pleasant 64 year old woman with an ischemic cardiomyopathy, status post anterior MI, with a chronically occluded LAD. She underwent ICD generator change with a new ICD lead several months ago. In the interim, she has done well. No chest pain, sob, syncope or edema. She is active. She has not lost any weight.  Allergies  Allergen Reactions  . Codeine Nausea And Vomiting  . Crestor [Rosuvastatin] Other (See Comments)    Muscle aches  . Ecotrin [Aspirin] Other (See Comments)    BLEEDING ULCER - ONLY IN HIGH DOSES  . Lipitor [Atorvastatin] Other (See Comments)    Muscle aches  . Nsaids Other (See Comments)    BLEEDING ULCER  . Penicillins Rash  . Sulfa Drugs Cross Reactors Nausea And Vomiting  . Vitamin C Other (See Comments)    Upsets stomach  . Zocor [Simvastatin] Other (See Comments)    Muscle aches     Current Outpatient Prescriptions  Medication Sig Dispense Refill  . aspirin EC 81 MG tablet Take 81 mg by mouth daily.    . carvedilol (COREG) 25 MG tablet TAKE ONE & ONE-HALF TABLETS BY MOUTH TWICE DAILY 90 tablet 2  . cetirizine (ZYRTEC) 10 MG tablet Take 10 mg by mouth daily as needed for allergies.    . Cholecalciferol (VITAMIN D) 2000 UNITS CAPS Take 1 capsule (2,000 Units total) by mouth daily. 30 capsule   . citalopram (CELEXA) 40 MG tablet Take 40 mg by mouth daily.     Marland Kitchen ezetimibe (ZETIA) 10 MG tablet Take 1 tablet (10 mg total) by mouth daily. 30 tablet 6  . fexofenadine (ALLEGRA) 180 MG tablet Take 180 mg by mouth daily as needed for allergies or rhinitis.    . furosemide (LASIX) 40 MG tablet Take 1 tablet (40 mg total) by mouth daily. 90 tablet 3  . HYDROcodone-acetaminophen (NORCO/VICODIN) 5-325 MG per tablet Take 1-2 tablets by mouth every 4 (four) hours as needed for moderate pain. 10 tablet 0  . lisinopril (PRINIVIL,ZESTRIL) 20 MG tablet TAKE ONE TABLET BY MOUTH ONCE DAILY 30 tablet 8  . montelukast (SINGULAIR)  10 MG tablet Take 10 mg by mouth daily.   0  . potassium chloride (KLOR-CON M10) 10 MEQ tablet Take 1 tablet (10 mEq total) by mouth daily. 30 tablet 11  . pravastatin (PRAVACHOL) 20 MG tablet TAKE ONE TABLET BY MOUTH IN THE EVENING (Patient taking differently: TAKE 20 MG BY MOUTH IN THE EVENING) 30 tablet 11  . vitamin B-12 (CYANOCOBALAMIN) 100 MCG tablet Take 1 tablet by mouth daily.     No current facility-administered medications for this visit.     Past Medical History  Diagnosis Date  . CAD (coronary artery disease)     CHRONICALLY OCCLUDED LAD WITH A LARGE ANTERIOR WALL MI   . CHF (congestive heart failure) (HCC)     EF 30-40%  . ICD (implantable cardiac defibrillator), single, in situ     [employer]  . Ischemic cardiomyopathy   . Myocardial infarction (HCC)     ANTERIOR WALL  . Dyslipidemia   . Hypertension   . AICD (automatic cardioverter/defibrillator) present 12/08/2014    ROS:   All systems reviewed and negative except as noted in the HPI.   Past Surgical History  Procedure Laterality Date  . Cardiac catheterization  01/13/05, 06/29/99  . Cardiac defibrillator placement  02/21/05    SINGLE/MEDTRONIC  . Ep implantable device N/A 11/14/2014    Procedure:  ICD Generator Changeout with new lead & capping old [phone] lead;  Surgeon: Marinus MawGregg W Mande Auvil, MD;  [facility];  Service: Cardiovascular;  Laterality: N/A;  . Ep implantable device N/A 12/08/2014    Procedure: ICD Implant;  Surgeon: Marinus MawGregg W Baley Lorimer, MD;  [facility];  Service: Cardiovascular;  Laterality: N/A;     Family History  Problem Relation Age of Onset  . Hypertension Father   . Heart attack Mother   . Coronary artery disease Mother   . Aneurysm Brother      Social History   Social History  . Marital Status: Married    Spouse Name: N/A  . Number of Children: N/A  . Years of Education: N/A   Occupational History  . Not on file.   Social History Main Topics  .  Smoking status: Never Smoker   . Smokeless tobacco: Never Used  . Alcohol Use: No  . Drug Use: No  . Sexual Activity: Not on file   Other Topics Concern  . Not on file   Social History Narrative     BP 120/84 mmHg  Pulse 73  Ht 5\' 2"  (1.575 m)  Wt 245 lb 3.2 oz (111.222 kg)  BMI 44.84 kg/m2  Physical Exam:  Well appearing obese, middle-age woman,NAD HEENT: Unremarkable Neck:  6 cm JVD, no thyromegally Back:  No CVA tenderness Lungs:  Clear with no wheezes, rales, or rhonchi. HEART:  Regular rate rhythm, no murmurs, no rubs, no clicks Abd:  soft, positive bowel sounds, no organomegally, no rebound, no guarding Ext:  2 plus pulses, no edema, no cyanosis, no clubbing Skin:  No rashes no nodules Neuro:  CN II through XII intact, motor grossly intact  ECG - NSR with remote MI.  DEVICE  Normal device function.  See PaceArt for details.   Assess/Plan:

## 2015-03-10 NOTE — Assessment & Plan Note (Signed)
She denies anginal symptoms. Will follow.  

## 2015-03-10 NOTE — Assessment & Plan Note (Signed)
Her symptoms are well compensated. Will recheck in several months.

## 2015-03-10 NOTE — Assessment & Plan Note (Signed)
Her ICD is working normally. Will recheck in several months. 

## 2015-04-01 ENCOUNTER — Other Ambulatory Visit: Payer: Self-pay | Admitting: Internal Medicine

## 2015-06-04 ENCOUNTER — Other Ambulatory Visit: Payer: Self-pay | Admitting: Cardiovascular Disease

## 2015-06-04 ENCOUNTER — Other Ambulatory Visit: Payer: Self-pay

## 2015-06-04 MED ORDER — FUROSEMIDE 40 MG PO TABS: 40.0000 mg | ORAL_TABLET | Freq: Every day | ORAL | Status: DC

## 2015-06-09 ENCOUNTER — Ambulatory Visit (INDEPENDENT_AMBULATORY_CARE_PROVIDER_SITE_OTHER): Payer: Medicare Other | Admitting: *Deleted

## 2015-06-09 DIAGNOSIS — I255 Ischemic cardiomyopathy: Secondary | ICD-10-CM | POA: Diagnosis not present

## 2015-06-09 DIAGNOSIS — Z95810 Presence of automatic (implantable) cardiac defibrillator: Secondary | ICD-10-CM

## 2015-06-10 NOTE — Progress Notes (Signed)
Remote ICD transmission.   

## 2015-07-08 LAB — CUP PACEART REMOTE DEVICE CHECK
Date Time Interrogation Session: 20170321041806
HIGH POWER IMPEDANCE MEASURED VALUE: 82 Ohm
Implantable Lead Implant Date: 20160919
Implantable Lead Location: 753860
Lead Channel Pacing Threshold Pulse Width: 0.4 ms
Lead Channel Sensing Intrinsic Amplitude: 13.25 mV
Lead Channel Sensing Intrinsic Amplitude: 13.25 mV
Lead Channel Setting Pacing Pulse Width: 0.4 ms
MDC IDC LEAD MODEL: 6935
MDC IDC MSMT BATTERY REMAINING LONGEVITY: 135 mo
MDC IDC MSMT BATTERY VOLTAGE: 3.08 V
MDC IDC MSMT LEADCHNL RV IMPEDANCE VALUE: 456 Ohm
MDC IDC MSMT LEADCHNL RV IMPEDANCE VALUE: 532 Ohm
MDC IDC MSMT LEADCHNL RV PACING THRESHOLD AMPLITUDE: 0.875 V
MDC IDC SET LEADCHNL RV PACING AMPLITUDE: 2 V
MDC IDC SET LEADCHNL RV SENSING SENSITIVITY: 0.3 mV
MDC IDC STAT BRADY RV PERCENT PACED: 0.01 %

## 2015-07-10 ENCOUNTER — Encounter: Payer: Self-pay | Admitting: Cardiology

## 2015-09-08 ENCOUNTER — Ambulatory Visit (INDEPENDENT_AMBULATORY_CARE_PROVIDER_SITE_OTHER): Payer: Medicare Other | Admitting: *Deleted

## 2015-09-08 DIAGNOSIS — I255 Ischemic cardiomyopathy: Secondary | ICD-10-CM | POA: Diagnosis not present

## 2015-09-08 DIAGNOSIS — Z95810 Presence of automatic (implantable) cardiac defibrillator: Secondary | ICD-10-CM

## 2015-09-08 NOTE — Progress Notes (Signed)
Remote ICD transmission.   

## 2015-09-09 LAB — CUP PACEART REMOTE DEVICE CHECK
Battery Remaining Longevity: 134 mo
Date Time Interrogation Session: 20170620041603
HighPow Impedance: 87 Ohm
Implantable Lead Implant Date: 20160919
Implantable Lead Location: 753860
Implantable Lead Model: 6935
Lead Channel Pacing Threshold Pulse Width: 0.4 ms
Lead Channel Sensing Intrinsic Amplitude: 15.125 mV
Lead Channel Setting Pacing Amplitude: 2 V
Lead Channel Setting Pacing Pulse Width: 0.4 ms
MDC IDC MSMT BATTERY VOLTAGE: 3.05 V
MDC IDC MSMT LEADCHNL RV IMPEDANCE VALUE: 456 Ohm
MDC IDC MSMT LEADCHNL RV IMPEDANCE VALUE: 551 Ohm
MDC IDC MSMT LEADCHNL RV PACING THRESHOLD AMPLITUDE: 0.875 V
MDC IDC MSMT LEADCHNL RV SENSING INTR AMPL: 15.125 mV
MDC IDC SET LEADCHNL RV SENSING SENSITIVITY: 0.3 mV
MDC IDC STAT BRADY RV PERCENT PACED: 0.01 %

## 2015-09-11 ENCOUNTER — Encounter: Payer: Self-pay | Admitting: Cardiology

## 2015-09-28 ENCOUNTER — Other Ambulatory Visit: Payer: Self-pay | Admitting: Cardiovascular Disease

## 2015-10-26 ENCOUNTER — Other Ambulatory Visit: Payer: Self-pay | Admitting: Cardiovascular Disease

## 2015-11-19 ENCOUNTER — Encounter: Payer: Self-pay | Admitting: Internal Medicine

## 2015-11-20 ENCOUNTER — Ambulatory Visit (INDEPENDENT_AMBULATORY_CARE_PROVIDER_SITE_OTHER): Payer: Medicare Other | Admitting: Cardiovascular Disease

## 2015-11-20 ENCOUNTER — Encounter (INDEPENDENT_AMBULATORY_CARE_PROVIDER_SITE_OTHER): Payer: Self-pay

## 2015-11-20 ENCOUNTER — Encounter: Payer: Self-pay | Admitting: Cardiovascular Disease

## 2015-11-20 VITALS — BP 90/70 | HR 68 | Ht 62.0 in | Wt 239.6 lb

## 2015-11-20 DIAGNOSIS — E785 Hyperlipidemia, unspecified: Secondary | ICD-10-CM | POA: Diagnosis not present

## 2015-11-20 DIAGNOSIS — I2510 Atherosclerotic heart disease of native coronary artery without angina pectoris: Secondary | ICD-10-CM

## 2015-11-20 DIAGNOSIS — I2583 Coronary atherosclerosis due to lipid rich plaque: Principal | ICD-10-CM

## 2015-11-20 NOTE — Progress Notes (Signed)
Cardiology Office Note   Date:  11/20/2015   ID:  [name], DOB 1950-08-02, MRN [id number]  PCP:  Deloris Ping, MD  Cardiologist:   Kristeen Miss, MD   Chief Complaint  Patient presents with  . Coronary Artery Disease   1. Coronary artery disease-status post chronic LAD occlusion with a large anterior wall myocardial infarction 2. Congestive heart failure-EF of 30-40% 3. AICD placement 4. Hyperlipidemia 5 obesity  History of Present Illness:  [name] is doing very well from a cardiac standpoint. She's not had any episodes of chest pain or shortness of breath. She is able to do all of her normal activities without any significant problems. She has not had much success in losing weight  She denies any syncope or presyncope.  September 14, 2012:  [name] is doing well. She's not having any episodes of chest pain or shortness of breath. Her ICD has been interrogated recently and was found to be working properly. She has no CP or dyspnea when she does her regular activities. She does not get any regular exercise.   June 13, 2013:  [name] is doing ok. She is exercising some. Walks about 20 minutes a day - several days a week. No CP or dyspnea.    Feb. 25, 2016:  [name] is a 65 y.o. female who presents for follow up of her CAD and CHF.   Her ICD is working well.  She will need to have the battery checked more often recently because  it is nearing ERI.  She is not getting exercise.   Denies any chest pain  Or dyspnea.   Needs to lose weight.  [facility]   November 12, 2014:  Patient is doing well. No cp or dyspnea. Perhaps getting some exercise  Sept. 1, 2017:  Is doing well.  Followed for CHF and ICD placement  Has hx of chronic systlic CHF Breathing is good.  No CP    Past Medical History:  Diagnosis Date  . AICD (automatic cardioverter/defibrillator) present 12/08/2014  . CAD (coronary artery disease)    CHRONICALLY  OCCLUDED LAD WITH A LARGE ANTERIOR WALL MI   . CHF (congestive heart failure) (HCC)    EF 30-40%  . Dyslipidemia   . Hypertension   . ICD (implantable cardiac defibrillator), single, in situ    [employer]TRONIC  . Ischemic cardiomyopathy   . Myocardial infarction [facility])    ANTERIOR WALL    Past Surgical History:  Procedure Laterality Date  . CARDIAC CATHETERIZATION  01/13/05, 06/29/99  . CARDIAC DEFIBRILLATOR PLACEMENT  02/21/05   SINGLE/MEDTRONIC  . EP IMPLANTABLE DEVICE N/A 11/14/2014   Procedure:  ICD Generator Changeout with new lead & capping old [phone] lead;  Surgeon: Marinus Maw, MD;  Location: [facility] INVASIVE CV LAB;  Service: Cardiovascular;  Laterality: N/A;  . EP IMPLANTABLE DEVICE N/A 12/08/2014   Procedure: ICD Implant;  Surgeon: Marinus Maw, MD;  Location: [facility] INVASIVE CV LAB;  Service: Cardiovascular;  Laterality: N/A;     Current Outpatient Prescriptions  Medication Sig Dispense Refill  . aspirin EC 81 MG tablet Take 81 mg by mouth daily.    . carvedilol (COREG) 25 MG tablet TAKE ONE & ONE-HALF TABLETS BY MOUTH TWICE DAILY 90 tablet 10  . cetirizine (ZYRTEC) 10 MG tablet Take 10 mg by mouth daily as needed for allergies.    . Cholecalciferol (VITAMIN D) 2000 UNITS CAPS Take 1 capsule (2,000  Units total) by mouth daily. 30 capsule   . citalopram (CELEXA) 40 MG tablet Take 40 mg by mouth daily.     Marland Kitchen ezetimibe (ZETIA) 10 MG tablet Take 1 tablet (10 mg total) by mouth daily. 30 tablet 6  . fexofenadine (ALLEGRA) 180 MG tablet Take 180 mg by mouth daily as needed for allergies or rhinitis.    . furosemide (LASIX) 40 MG tablet Take 1 tablet (40 mg total) by mouth daily. 90 tablet 2  . HYDROcodone-acetaminophen (NORCO/VICODIN) 5-325 MG per tablet Take 1-2 tablets by mouth every 4 (four) hours as needed for moderate pain. 10 tablet 0  . lisinopril (PRINIVIL,ZESTRIL) 20 MG tablet TAKE ONE TABLET BY MOUTH ONCE DAILY 30 tablet 8  . montelukast (SINGULAIR) 10 MG tablet Take 10 mg by mouth  daily.   0  . potassium chloride (KLOR-CON M10) 10 MEQ tablet Take 1 tablet (10 mEq total) by mouth daily. 30 tablet 11  . pravastatin (PRAVACHOL) 20 MG tablet TAKE ONE TABLET BY MOUTH IN THE EVENING 30 tablet 0  . vitamin B-12 (CYANOCOBALAMIN) 100 MCG tablet Take 1 tablet by mouth daily.     No current facility-administered medications for this visit.     Allergies:   Codeine; Crestor [rosuvastatin]; Ecotrin [aspirin]; Lipitor [atorvastatin]; Nsaids; Penicillins; Sulfa drugs cross reactors; Vitamin c; and Zocor [simvastatin]    Social History:  The patient  reports that she has never smoked. She has never used smokeless tobacco. She reports that she does not drink alcohol or use drugs.   Family History:  The patient's family history includes Aneurysm in her brother; Coronary artery disease in her mother; Heart attack in her mother; Hypertension in her father.    ROS:  Please see the history of present illness.    Review of Systems: Constitutional:  denies fever, chills, diaphoresis, appetite change and fatigue.  HEENT: denies photophobia, eye pain, redness, hearing loss, ear pain, congestion, sore throat, rhinorrhea, sneezing, neck pain, neck stiffness and tinnitus.  Respiratory: denies SOB, DOE, cough, chest tightness, and wheezing.  Cardiovascular: denies chest pain, palpitations and leg swelling.  Gastrointestinal: denies nausea, vomiting, abdominal pain, diarrhea, constipation, blood in stool.  Genitourinary: denies dysuria, urgency, frequency, hematuria, flank pain and difficulty urinating.  Musculoskeletal: denies  myalgias, back pain, joint swelling, arthralgias and gait problem.   Skin: denies pallor, rash and wound.  Neurological: denies dizziness, seizures, syncope, weakness, light-headedness, numbness and headaches.   Hematological: denies adenopathy, easy bruising, personal or family bleeding history.  Psychiatric/ Behavioral: denies suicidal ideation, mood changes,  confusion, nervousness, sleep disturbance and agitation.       All other systems are reviewed and negative.    PHYSICAL EXAM: VS:  BP 90/70 (BP Location: Right Arm, Patient Position: Sitting, Cuff Size: Normal)   Pulse 68   Ht 5\' 2"  (1.575 m)   Wt 239 lb 9.6 oz (108.7 kg)   BMI 43.82 kg/m  , BMI Body mass index is 43.82 kg/m. GEN: Well nourished, well developed, in no acute distress  HEENT: normal  Neck: no JVD, carotid bruits, or masses Cardiac: RRR; no murmurs, rubs, or gallops,no edema  Respiratory:  clear to auscultation bilaterally, normal work of breathing GI: soft, nontender, nondistended, + BS MS: no deformity or atrophy  Skin: warm and dry, no rash Neuro:  Strength and sensation are intact Psych: normal   EKG:  EKG is ordered today. The ekg ordered today demonstrates NSR at 71. NS ST abn.    Recent Labs:  12/08/2014: BUN 12; Creatinine, Ser 0.88; Hemoglobin 13.9; Platelets 214; Potassium 4.3; Sodium 140    Lipid Panel    Component Value Date/Time   CHOL 163 11/10/2014 0926   TRIG 93.0 11/10/2014 0926   HDL 46.80 11/10/2014 0926   CHOLHDL 3 11/10/2014 0926   VLDL 18.6 11/10/2014 0926   LDLCALC 98 11/10/2014 0926      Wt Readings from Last 3 Encounters:  11/20/15 239 lb 9.6 oz (108.7 kg)  03/10/15 245 lb 3.2 oz (111.2 kg)  12/08/14 195 lb (88.5 kg)      Other studies Reviewed: Additional studies/ records that were reviewed today include: . Review of the above records demonstrates:    ASSESSMENT AND PLAN:  1. Coronary artery disease-status post chronic LAD occlusion with a large anterior wall myocardial infarction- she is doing well.  No CP .  No worsening of her dyspnea.  Continue current meds.   2. Congestive heart failure-EF of 30-40% - stable. Continue current meds.  3. AICD placement - follow up with Dr. Ladona Ridgelaylor   4. Hyperlipidemia - followed in lipid clinic  5 obesity - encouraged her to lose some weight   Current medicines are reviewed  at length with the patient today.  The patient does not have concerns regarding medicines.  The following changes have been made:  no change   Disposition:   FU with 1 year   Signed, Kristeen MissPhilip [name], MD  11/20/2015 11:52 AM    [facility] [address], [place], [place]  [address] Phone: [phone]; Fax: [phone]

## 2015-11-20 NOTE — Patient Instructions (Signed)

## 2015-11-23 ENCOUNTER — Other Ambulatory Visit: Payer: Self-pay | Admitting: Internal Medicine

## 2015-11-30 ENCOUNTER — Other Ambulatory Visit: Payer: Self-pay | Admitting: Cardiovascular Disease

## 2015-12-07 ENCOUNTER — Ambulatory Visit (INDEPENDENT_AMBULATORY_CARE_PROVIDER_SITE_OTHER): Payer: Medicare Other | Admitting: Internal Medicine

## 2015-12-07 ENCOUNTER — Encounter: Payer: Self-pay | Admitting: Internal Medicine

## 2015-12-07 VITALS — BP 112/64 | HR 67 | Ht 61.0 in | Wt 240.8 lb

## 2015-12-07 DIAGNOSIS — I255 Ischemic cardiomyopathy: Secondary | ICD-10-CM | POA: Diagnosis not present

## 2015-12-07 DIAGNOSIS — Z95810 Presence of automatic (implantable) cardiac defibrillator: Secondary | ICD-10-CM | POA: Diagnosis not present

## 2015-12-07 NOTE — Progress Notes (Signed)
HPI [name] returns today for followup. She is a very pleasant 65 year old woman with an ischemic cardiomyopathy, status post anterior MI, with a chronically occluded LAD. She underwent ICD generator change with a new ICD lead a year ago. In the interim, she has done well. No chest pain, sob, syncope or edema. She is active. She has not lost any weight.  Allergies  Allergen Reactions  . Codeine Nausea And Vomiting  . Crestor [Rosuvastatin] Other (See Comments)    Muscle aches  . Ecotrin [Aspirin] Other (See Comments)    BLEEDING ULCER - ONLY IN HIGH DOSES  . Lipitor [Atorvastatin] Other (See Comments)    Muscle aches  . Nsaids Other (See Comments)    BLEEDING ULCER  . Penicillins Rash  . Sulfa Drugs Cross Reactors Nausea And Vomiting  . Vitamin C Other (See Comments)    Upsets stomach  . Zocor [Simvastatin] Other (See Comments)    Muscle aches     Current Outpatient Prescriptions  Medication Sig Dispense Refill  . aspirin EC 81 MG tablet Take 81 mg by mouth daily.    . carvedilol (COREG) 25 MG tablet TAKE ONE & ONE-HALF TABLETS BY MOUTH TWICE DAILY 90 tablet 10  . cetirizine (ZYRTEC) 10 MG tablet Take 10 mg by mouth daily as needed for allergies.    . Cholecalciferol (VITAMIN D) 2000 UNITS CAPS Take 1 capsule (2,000 Units total) by mouth daily. 30 capsule   . citalopram (CELEXA) 40 MG tablet Take 40 mg by mouth daily.     Marland Kitchen ezetimibe (ZETIA) 10 MG tablet Take 1 tablet (10 mg total) by mouth daily. 30 tablet 6  . fexofenadine (ALLEGRA) 180 MG tablet Take 180 mg by mouth daily as needed for allergies or rhinitis.    . furosemide (LASIX) 40 MG tablet Take 1 tablet (40 mg total) by mouth daily. 90 tablet 2  . HYDROcodone-acetaminophen (NORCO/VICODIN) 5-325 MG per tablet Take 1-2 tablets by mouth every 4 (four) hours as needed for moderate pain. 10 tablet 0  . lisinopril (PRINIVIL,ZESTRIL) 20 MG tablet TAKE ONE TABLET BY MOUTH ONCE DAILY 30 tablet 11  . montelukast (SINGULAIR) 10 MG  tablet Take 10 mg by mouth daily.   0  . potassium chloride (KLOR-CON M10) 10 MEQ tablet Take 1 tablet (10 mEq total) by mouth daily. 30 tablet 11  . pravastatin (PRAVACHOL) 20 MG tablet Take 1 tablet (20 mg total) by mouth daily. 90 tablet 3  . vitamin B-12 (CYANOCOBALAMIN) 100 MCG tablet Take 1 tablet by mouth daily.     No current facility-administered medications for this visit.      Past Medical History:  Diagnosis Date  . AICD (automatic cardioverter/defibrillator) present 12/08/2014  . CAD (coronary artery disease)    CHRONICALLY OCCLUDED LAD WITH A LARGE ANTERIOR WALL MI   . CHF (congestive heart failure) (HCC)    EF 30-40%  . Dyslipidemia   . Hypertension   . ICD (implantable cardiac defibrillator), single, in situ    [employer]TR[employer]IC  . Ischemic cardiomyopathy   . Myocardial infarction (HCC)    ANTERIOR WALL    ROS:   All systems reviewed and negative except as noted in the HPI.   Past Surgical History:  Procedure Laterality Date  . CARDIAC CATHETERIZATION  01/13/05, 06/29/99  . CARDIAC DEFIBRILLATOR PLACEMENT  02/21/05   SINGLE/MEDTRONIC  . EP IMPLANTABLE DEVICE N/A 11/14/2014   Procedure:  ICD Generator Changeout with new lead & capping old [phone] lead;  Surgeon: [name]  Nancy HidalgoW Nancy Southers, MD;  Location: [facility] INVASIVE CV LAB;  Service: Cardiovascular;  Laterality: N/A;  . EP IMPLANTABLE DEVICE N/A 12/08/2014   Procedure: ICD Implant;  Surgeon: Marinus MawGregg W Wyatt Galvan, MD;  [facility];  Service: Cardiovascular;  Laterality: N/A;     Family History  Problem Relation Age of Onset  . Heart attack Mother   . Coronary artery disease Mother   . Aneurysm Brother   . Hypertension Father      Social History   Social History  . Marital status: Married    Spouse name: N/A  . Number of children: N/A  . Years of education: N/A   Occupational History  . Not on file.   Social History Main Topics  . Smoking status: Never Smoker  . Smokeless tobacco: Never Used  . Alcohol  use No  . Drug use: No  . Sexual activity: Not on file   Other Topics Concern  . Not on file   Social History Narrative  . No narrative on file     BP 112/64   Pulse 67   Ht 5\' 1"  (1.549 m)   Wt 240 lb 12.8 oz (109.2 kg)   SpO2 96%   BMI 45.50 kg/m   Physical Exam:  Well appearing obese, middle-age woman,NAD HEENT: Unremarkable Neck:  6 cm JVD, no thyromegally Back:  No CVA tenderness Lungs:  Clear with no wheezes, rales, or rhonchi. HEART:  Regular rate rhythm, no murmurs, no rubs, no clicks Abd:  soft, positive bowel sounds, no organomegally, no rebound, no guarding Ext:  2 plus pulses, no edema, no cyanosis, no clubbing Skin:  No rashes no nodules Neuro:  CN II through XII intact, motor grossly intact  ECG - NSR with remote MI.  DEVICE  Normal device function.  See PaceArt for details.   Assess/Plan: 1. CAD - she is s/p MI and has had no anginal symptoms. Will continue her current meds. 2. Obesity - she is encouraged to lose weight. 3. ICD - Her Medtronic VVI ICD is working normally. Will follow.  Leonia ReevesGregg Verniece Huber,M.D.

## 2015-12-07 NOTE — Patient Instructions (Addendum)
Medication Instructions:    Your physician recommends that you continue on your current medications as directed. Please refer to the Current Medication list given to you today.  --- If you need a refill on your cardiac medications before your next appointment, please call your pharmacy. ---  Labwork:  None ordered  Testing/Procedures:  None ordered  Follow-Up: Remote monitoring is used to monitor your Pacemaker of ICD from home. This monitoring reduces the number of office visits required to check your device to one time per year. It allows us to keep an eye on the functioning of your device to ensure it is working properly. You are scheduled for a device check from home on 03/07/2016. You may send your transmission at any time that day. If you have a wireless device, the transmission will be sent automatically. After your physician reviews your transmission, you will receive a postcard with your next transmission date.   Your physician wants you to follow-up in: 1 year with Dr. Taylor.  You will receive a reminder letter in the mail two months in advance. If you don't receive a letter, please call our office to schedule the follow-up appointment.   Thank you for choosing [facility]!!      

## 2016-01-25 ENCOUNTER — Other Ambulatory Visit: Payer: Self-pay | Admitting: Cardiovascular Disease

## 2016-03-07 ENCOUNTER — Other Ambulatory Visit: Payer: Self-pay | Admitting: Internal Medicine

## 2016-03-07 ENCOUNTER — Other Ambulatory Visit: Payer: Self-pay | Admitting: Cardiovascular Disease

## 2016-03-07 ENCOUNTER — Ambulatory Visit (INDEPENDENT_AMBULATORY_CARE_PROVIDER_SITE_OTHER): Payer: Medicare Other | Admitting: *Deleted

## 2016-03-07 DIAGNOSIS — I255 Ischemic cardiomyopathy: Secondary | ICD-10-CM

## 2016-03-07 NOTE — Progress Notes (Signed)
Remote ICD transmission.   

## 2016-03-08 LAB — CUP PACEART REMOTE DEVICE CHECK
Battery Remaining Longevity: 130 mo
Battery Voltage: 3.04 V
Brady Statistic RV Percent Paced: 0.01 %
Date Time Interrogation Session: 20171218051804
HIGH POWER IMPEDANCE MEASURED VALUE: 89 Ohm
Implantable Pulse Generator Implant Date: 20160919
Lead Channel Impedance Value: 456 Ohm
Lead Channel Impedance Value: 551 Ohm
Lead Channel Pacing Threshold Pulse Width: 0.4 ms
Lead Channel Sensing Intrinsic Amplitude: 12.125 mV
Lead Channel Setting Pacing Amplitude: 2 V
Lead Channel Setting Pacing Pulse Width: 0.4 ms
MDC IDC LEAD IMPLANT DT: 20160919
MDC IDC LEAD LOCATION: 753860
MDC IDC LEAD MODEL: 6935
MDC IDC MSMT LEADCHNL RV PACING THRESHOLD AMPLITUDE: 0.75 V
MDC IDC MSMT LEADCHNL RV SENSING INTR AMPL: 12.125 mV
MDC IDC SET LEADCHNL RV SENSING SENSITIVITY: 0.3 mV

## 2016-03-09 ENCOUNTER — Encounter: Payer: Self-pay | Admitting: Cardiology

## 2016-06-06 ENCOUNTER — Ambulatory Visit (INDEPENDENT_AMBULATORY_CARE_PROVIDER_SITE_OTHER): Payer: Medicare Other | Admitting: *Deleted

## 2016-06-06 DIAGNOSIS — I255 Ischemic cardiomyopathy: Secondary | ICD-10-CM

## 2016-06-06 NOTE — Progress Notes (Signed)
Remote ICD transmission.   

## 2016-06-08 ENCOUNTER — Encounter: Payer: Self-pay | Admitting: Cardiology

## 2016-06-08 LAB — CUP PACEART REMOTE DEVICE CHECK
Battery Remaining Longevity: 128 mo
Brady Statistic RV Percent Paced: 0.01 %
HighPow Impedance: 104 Ohm
Implantable Lead Location: 753860
Implantable Lead Model: 6935
Lead Channel Impedance Value: 551 Ohm
Lead Channel Pacing Threshold Amplitude: 0.75 V
Lead Channel Sensing Intrinsic Amplitude: 12.625 mV
Lead Channel Setting Pacing Pulse Width: 0.4 ms
Lead Channel Setting Sensing Sensitivity: 0.3 mV
MDC IDC LEAD IMPLANT DT: 20160919
MDC IDC MSMT BATTERY VOLTAGE: 3.03 V
MDC IDC MSMT LEADCHNL RV IMPEDANCE VALUE: 456 Ohm
MDC IDC MSMT LEADCHNL RV PACING THRESHOLD PULSEWIDTH: 0.4 ms
MDC IDC MSMT LEADCHNL RV SENSING INTR AMPL: 12.625 mV
MDC IDC PG IMPLANT DT: 20160919
MDC IDC SESS DTM: 20180319041604
MDC IDC SET LEADCHNL RV PACING AMPLITUDE: 2 V

## 2016-09-05 ENCOUNTER — Ambulatory Visit (INDEPENDENT_AMBULATORY_CARE_PROVIDER_SITE_OTHER): Payer: Medicare Other | Admitting: *Deleted

## 2016-09-05 DIAGNOSIS — I255 Ischemic cardiomyopathy: Secondary | ICD-10-CM

## 2016-09-05 NOTE — Progress Notes (Signed)
Remote ICD transmission.   

## 2016-09-06 LAB — CUP PACEART REMOTE DEVICE CHECK
Battery Remaining Longevity: 126 mo
Battery Voltage: 3.03 V
Date Time Interrogation Session: 20180618083724
HIGH POWER IMPEDANCE MEASURED VALUE: 93 Ohm
Implantable Lead Implant Date: 20160919
Implantable Pulse Generator Implant Date: 20160919
Lead Channel Impedance Value: 456 Ohm
Lead Channel Pacing Threshold Amplitude: 0.75 V
Lead Channel Pacing Threshold Pulse Width: 0.4 ms
Lead Channel Sensing Intrinsic Amplitude: 14 mV
Lead Channel Setting Pacing Amplitude: 2 V
MDC IDC LEAD LOCATION: 753860
MDC IDC MSMT LEADCHNL RV IMPEDANCE VALUE: 532 Ohm
MDC IDC MSMT LEADCHNL RV SENSING INTR AMPL: 14 mV
MDC IDC SET LEADCHNL RV PACING PULSEWIDTH: 0.4 ms
MDC IDC SET LEADCHNL RV SENSING SENSITIVITY: 0.3 mV
MDC IDC STAT BRADY RV PERCENT PACED: 0.01 %

## 2016-09-08 ENCOUNTER — Encounter: Payer: Self-pay | Admitting: Cardiology

## 2016-11-17 ENCOUNTER — Other Ambulatory Visit: Payer: Self-pay | Admitting: Internal Medicine

## 2016-11-22 ENCOUNTER — Other Ambulatory Visit: Payer: Self-pay | Admitting: Internal Medicine

## 2016-11-22 ENCOUNTER — Other Ambulatory Visit: Payer: Self-pay | Admitting: Cardiovascular Disease

## 2016-12-05 ENCOUNTER — Ambulatory Visit (INDEPENDENT_AMBULATORY_CARE_PROVIDER_SITE_OTHER): Payer: Medicare Other | Admitting: *Deleted

## 2016-12-05 ENCOUNTER — Telehealth: Payer: Self-pay | Admitting: Cardiology

## 2016-12-05 DIAGNOSIS — I5022 Chronic systolic (congestive) heart failure: Secondary | ICD-10-CM

## 2016-12-05 DIAGNOSIS — I255 Ischemic cardiomyopathy: Secondary | ICD-10-CM

## 2016-12-05 NOTE — Progress Notes (Signed)
Remote ICD transmission.   

## 2016-12-05 NOTE — Telephone Encounter (Signed)
Confirmed remote transmission w/ pt.   

## 2016-12-06 LAB — CUP PACEART REMOTE DEVICE CHECK
HighPow Impedance: 80 Ohm
Implantable Lead Location: 753860
Implantable Lead Model: 6935
Implantable Pulse Generator Implant Date: 20160919
Lead Channel Impedance Value: 456 Ohm
Lead Channel Impedance Value: 551 Ohm
Lead Channel Setting Pacing Amplitude: 2 V
Lead Channel Setting Pacing Pulse Width: 0.4 ms
Lead Channel Setting Sensing Sensitivity: 0.3 mV
MDC IDC LEAD IMPLANT DT: 20160919
MDC IDC MSMT BATTERY REMAINING LONGEVITY: 124 mo
MDC IDC MSMT BATTERY VOLTAGE: 3.03 V
MDC IDC MSMT LEADCHNL RV PACING THRESHOLD AMPLITUDE: 0.75 V
MDC IDC MSMT LEADCHNL RV PACING THRESHOLD PULSEWIDTH: 0.4 ms
MDC IDC MSMT LEADCHNL RV SENSING INTR AMPL: 12.375 mV
MDC IDC MSMT LEADCHNL RV SENSING INTR AMPL: 12.375 mV
MDC IDC SESS DTM: 20180917092706
MDC IDC STAT BRADY RV PERCENT PACED: 0 %

## 2016-12-07 ENCOUNTER — Encounter: Payer: Self-pay | Admitting: Cardiology

## 2016-12-12 ENCOUNTER — Encounter: Payer: Medicare Other | Admitting: Internal Medicine

## 2016-12-19 ENCOUNTER — Other Ambulatory Visit: Payer: Self-pay | Admitting: Cardiovascular Disease

## 2016-12-20 ENCOUNTER — Ambulatory Visit (INDEPENDENT_AMBULATORY_CARE_PROVIDER_SITE_OTHER): Payer: Medicare Other | Admitting: Internal Medicine

## 2016-12-20 ENCOUNTER — Encounter: Payer: Self-pay | Admitting: Cardiovascular Disease

## 2016-12-20 ENCOUNTER — Ambulatory Visit (INDEPENDENT_AMBULATORY_CARE_PROVIDER_SITE_OTHER): Payer: Medicare Other | Admitting: Cardiovascular Disease

## 2016-12-20 ENCOUNTER — Encounter: Payer: Self-pay | Admitting: Internal Medicine

## 2016-12-20 VITALS — BP 120/80 | HR 71 | Ht 62.0 in | Wt 243.4 lb

## 2016-12-20 VITALS — BP 110/72 | HR 66 | Ht 62.0 in | Wt 243.6 lb

## 2016-12-20 DIAGNOSIS — E785 Hyperlipidemia, unspecified: Secondary | ICD-10-CM | POA: Diagnosis not present

## 2016-12-20 DIAGNOSIS — I5022 Chronic systolic (congestive) heart failure: Secondary | ICD-10-CM

## 2016-12-20 DIAGNOSIS — I2510 Atherosclerotic heart disease of native coronary artery without angina pectoris: Secondary | ICD-10-CM

## 2016-12-20 DIAGNOSIS — I255 Ischemic cardiomyopathy: Secondary | ICD-10-CM | POA: Diagnosis not present

## 2016-12-20 DIAGNOSIS — Z95810 Presence of automatic (implantable) cardiac defibrillator: Secondary | ICD-10-CM

## 2016-12-20 DIAGNOSIS — I2583 Coronary atherosclerosis due to lipid rich plaque: Secondary | ICD-10-CM

## 2016-12-20 LAB — COMPREHENSIVE METABOLIC PANEL
ALK PHOS: 52 IU/L (ref 39–117)
ALT: 12 IU/L (ref 0–32)
AST: 16 IU/L (ref 0–40)
Albumin/Globulin Ratio: 1.9 (ref 1.2–2.2)
Albumin: 4.2 g/dL (ref 3.6–4.8)
BUN/Creatinine Ratio: 18 (ref 12–28)
BUN: 14 mg/dL (ref 8–27)
Bilirubin Total: 0.5 mg/dL (ref 0.0–1.2)
CALCIUM: 9.3 mg/dL (ref 8.7–10.3)
CO2: 23 mmol/L (ref 20–29)
CREATININE: 0.8 mg/dL (ref 0.57–1.00)
Chloride: 101 mmol/L (ref 96–106)
GFR calc Af Amer: 88 mL/min/{1.73_m2} (ref >59)
GFR, EST NON AFRICAN AMERICAN: 77 mL/min/{1.73_m2} (ref >59)
GLUCOSE: 95 mg/dL (ref 65–99)
Globulin, Total: 2.2 g/dL (ref 1.5–4.5)
Potassium: 4.6 mmol/L (ref 3.5–5.2)
Sodium: 141 mmol/L (ref 134–144)
Total Protein: 6.4 g/dL (ref 6.0–8.5)

## 2016-12-20 LAB — LIPID PANEL
CHOLESTEROL TOTAL: 159 mg/dL (ref 100–199)
Chol/HDL Ratio: 3.6 ratio (ref 0.0–4.4)
HDL: 44 mg/dL (ref >39)
LDL CALC: 91 mg/dL (ref 0–99)
TRIGLYCERIDES: 122 mg/dL (ref 0–149)
VLDL Cholesterol Cal: 24 mg/dL (ref 5–40)

## 2016-12-20 NOTE — Progress Notes (Signed)
Cardiology Office Note   Date:  12/20/2016   ID:  [name], DOB 1950-11-26, MRN [id number]  PCP:  Deloris Ping, MD  Cardiologist:   Kristeen Miss, MD   Chief Complaint  Patient presents with  . Follow-up    CAD   1. Coronary artery disease-status post chronic LAD occlusion with a large anterior wall myocardial infarction 2. Congestive heart failure-EF of 30-40% 3. AICD placement 4. Hyperlipidemia 5 obesity  History of Present Illness:  [name] is doing very well from a cardiac standpoint. She's not had any episodes of chest pain or shortness of breath. She is able to do all of her normal activities without any significant problems. She has not had much success in losing weight  She denies any syncope or presyncope.  September 14, 2012:  [name] is doing well. She's not having any episodes of chest pain or shortness of breath. Her ICD has been interrogated recently and was found to be working properly. She has no CP or dyspnea when she does her regular activities. She does not get any regular exercise.   June 13, 2013:  [name] is doing ok. She is exercising some. Walks about 20 minutes a day - several days a week. No CP or dyspnea.    Feb. 25, 2016:  [name] is a 66 y.o. female who presents for follow up of her CAD and CHF.   Her ICD is working well.  She will need to have the battery checked more often recently because  it is nearing ERI.  She is not getting exercise.   Denies any chest pain  Or dyspnea.   Needs to lose weight.  [facility]   November 12, 2014:  Patient is doing well. No cp or dyspnea. Perhaps getting some exercise  Sept. 1, 2017:  Is doing well.  Followed for CHF and ICD placement  Has hx of chronic systlic CHF Breathing is good.  No CP   Oct. 2, 2018:    [name] is seen today for follow up ov her CAD, CHF and ICD placement .    Wt = 243 lbs. ( up 4 lbs since last years visit )     Past Medical  History:  Diagnosis Date  . AICD (automatic cardioverter/defibrillator) present 12/08/2014  . CAD (coronary artery disease)    CHRONICALLY OCCLUDED LAD WITH A LARGE ANTERIOR WALL MI   . CHF (congestive heart failure) (HCC)    EF 30-40%  . Dyslipidemia   . Hypertension   . ICD (implantable cardiac defibrillator), single, in situ    [employer]TR[employer]IC  . Ischemic cardiomyopathy   . Myocardial infarction [facility])    ANTERIOR WALL    Past Surgical History:  Procedure Laterality Date  . CARDIAC CATHETERIZATION  01/13/05, 06/29/99  . CARDIAC DEFIBRILLATOR PLACEMENT  02/21/05   SINGLE/MEDTRONIC  . EP IMPLANTABLE DEVICE N/A 11/14/2014   Procedure:  ICD Generator Changeout with new lead & capping old [phone] lead;  Surgeon: Marinus Maw, MD;  Location: [facility] INVASIVE CV LAB;  Service: Cardiovascular;  Laterality: N/A;  . EP IMPLANTABLE DEVICE N/A 12/08/2014   Procedure: ICD Implant;  Surgeon: Marinus Maw, MD;  Location: [facility] INVASIVE CV LAB;  Service: Cardiovascular;  Laterality: N/A;     Current Outpatient Prescriptions  Medication Sig Dispense Refill  . aspirin EC 81 MG tablet Take 81 mg by mouth daily.    . carvedilol (COREG) 25 MG tablet  TAKE ONE & ONE-HALF TABLETS BY MOUTH TWICE DAILY 270 tablet 2  . cetirizine (ZYRTEC) 10 MG tablet Take 10 mg by mouth daily as needed for allergies.    . Cholecalciferol (VITAMIN D) 2000 UNITS CAPS Take 1 capsule (2,000 Units total) by mouth daily. 30 capsule   . citalopram (CELEXA) 40 MG tablet Take 40 mg by mouth daily.     Marland Kitchen ezetimibe (ZETIA) 10 MG tablet Take 1 tablet (10 mg total) by mouth daily. 30 tablet 6  . fexofenadine (ALLEGRA) 180 MG tablet Take 180 mg by mouth daily as needed for allergies or rhinitis.    . furosemide (LASIX) 40 MG tablet TAKE ONE TABLET BY MOUTH ONCE DAILY 90 tablet 0  . HYDROcodone-acetaminophen (NORCO/VICODIN) 5-325 MG per tablet Take 1-2 tablets by mouth every 4 (four) hours as needed for moderate pain. 10 tablet 0  . lisinopril  (PRINIVIL,ZESTRIL) 20 MG tablet TAKE ONE TABLET BY MOUTH ONCE DAILY 90 tablet 0  . montelukast (SINGULAIR) 10 MG tablet Take 10 mg by mouth daily.   0  . potassium chloride (K-DUR,KLOR-CON) 10 MEQ tablet TAKE ONE TABLET BY MOUTH ONCE DAILY 30 tablet 11  . pravastatin (PRAVACHOL) 20 MG tablet TAKE ONE TABLET BY MOUTH ONCE DAILY 90 tablet 3  . vitamin B-12 (CYANOCOBALAMIN) 100 MCG tablet Take 1 tablet by mouth daily.     No current facility-administered medications for this visit.     Allergies:   Codeine; Crestor [rosuvastatin]; Doxycycline; Ecotrin [aspirin]; Lipitor [atorvastatin]; Nsaids; Penicillins; Sulfa drugs cross reactors; Vitamin c; and Zocor [simvastatin]    Social History:  The patient  reports that she has never smoked. She has never used smokeless tobacco. She reports that she does not drink alcohol or use drugs.   Family History:  The patient's family history includes Aneurysm in her brother; Coronary artery disease in her mother; Heart attack in her mother; Hypertension in her father.    ROS:  Please see the history of present illness.    Review of Systems: Constitutional:  denies fever, chills, diaphoresis, appetite change and fatigue.  HEENT: denies photophobia, eye pain, redness, hearing loss, ear pain, congestion, sore throat, rhinorrhea, sneezing, neck pain, neck stiffness and tinnitus.  Respiratory: denies SOB, DOE, cough, chest tightness, and wheezing.  Cardiovascular: denies chest pain, palpitations and leg swelling.  Gastrointestinal: denies nausea, vomiting, abdominal pain, diarrhea, constipation, blood in stool.  Genitourinary: denies dysuria, urgency, frequency, hematuria, flank pain and difficulty urinating.  Musculoskeletal: denies  myalgias, back pain, joint swelling, arthralgias and gait problem.   Skin: denies pallor, rash and wound.  Neurological: denies dizziness, seizures, syncope, weakness, light-headedness, numbness and headaches.   Hematological:  denies adenopathy, easy bruising, personal or family bleeding history.  Psychiatric/ Behavioral: denies suicidal ideation, mood changes, confusion, nervousness, sleep disturbance and agitation.       All other systems are reviewed and negative.   Physical Exam: Blood pressure 110/72, pulse 66, height  (1.575 m), weight 243 lb 9.6 oz (110.5 kg).  GEN:   Elderly female, obese  HEENT: Normal NECK: No JVD; No carotid bruits LYMPHATICS: No lymphadenopathy CARDIAC: RR, no murmurs, rubs, gallops RESPIRATORY:  Clear to auscultation without rales, wheezing or rhonchi  ABDOMEN: obese  MUSCULOSKELETAL:  No edema; No deformity  SKIN: Warm and dry NEUROLOGIC:  Alert and oriented x 3  EKG:  EKG is ordered today. Oct. 2, 2018:   NSR at 71.   Ant. MI . Old   Recent Labs: No results  found for requested labs within last 8760 hours.    Lipid Panel    Component Value Date/Time   CHOL 163 11/10/2014 0926   TRIG 93.0 11/10/2014 0926   HDL 46.80 11/10/2014 0926   CHOLHDL 3 11/10/2014 0926   VLDL 18.6 11/10/2014 0926   LDLCALC 98 11/10/2014 0926      Wt Readings from Last 3 Encounters:  12/20/16 243 lb 9.6 oz (110.5 kg)  12/20/16 243 lb 6.4 oz (110.4 kg)  12/07/15 240 lb 12.8 oz (109.2 kg)      Other studies Reviewed: Additional studies/ records that were reviewed today include: . Review of the above records demonstrates:    ASSESSMENT AND PLAN:  1. Coronary artery disease- She's not having any angina. We'll check lipids and complete medical profile today.   2. Congestive heart failure-EF of 30-40% -  continue current medications. Once again, I have advised her  to work on weight loss program.  3. AICD placement -   stable, she saw Dr. Ladona Ridgel  today.  4. Hyperlipidemia -  fasting lipids and complete medical profile today.  5 obesity - advised weight loss    Current medicines are reviewed at length with the patient today.  The patient does not have concerns regarding  medicines.  The following changes have been made:  no change   Disposition:   FU with 1 year   Signed, Kristeen Miss, MD  12/20/2016 9:59 AM    [facility] [address] [place], [place], [place]  [address] Phone: [phone]; Fax: [phone]

## 2016-12-20 NOTE — Patient Instructions (Signed)
Medication Instructions:  Your physician recommends that you continue on your current medications as directed. Please refer to the Current Medication list given to you today.  Labwork: None ordered.  Testing/Procedures: None ordered.  Follow-Up: Your physician wants you to follow-up in: one year with Dr. Ladona Ridgel.   You will receive a reminder letter in the mail two months in advance. If you don't receive a letter, please call our office to schedule the follow-up appointment.  Remote monitoring is used to monitor your ICD from home. This monitoring reduces the number of office visits required to check your device to one time per year. It allows [place] to keep an eye on the functioning of your device to ensure it is working properly. You are scheduled for a device check from home on 03/06/2017. You may send your transmission at any time that day. If you have a wireless device, the transmission will be sent automatically. After your physician reviews your transmission, you will receive a postcard with your next transmission date.    Any Other Special Instructions Will Be Listed Below (If Applicable).     If you need a refill on your cardiac medications before your next appointment, please call your pharmacy.

## 2016-12-20 NOTE — Patient Instructions (Signed)
Medication Instructions:   Your physician recommends that you continue on your current medications as directed. Please refer to the Current Medication list given to you today.    Labwork:  TODAY---CMET AND LIPIDS     Follow-Up:  Your physician wants you to follow-up in: ONE YEAR WITH DR [name] You will receive a reminder letter in the mail two months in advance. If you don't receive a letter, please call our office to schedule the follow-up appointment.    If you need a refill on your cardiac medications before your next appointment, please call your pharmacy.   

## 2016-12-20 NOTE — Progress Notes (Signed)
HPI [name] returns today for followup. She is a very pleasant 66 year old woman with coronary artery disease status post myocardial infarction, with a chronically occluded LAD, class IIa congestive heart failure, and morbid obesity. In the interim she has had no ICD shocks. She is still not lost any weight. She denies chest pain or shortness of breath. Allergies  Allergen Reactions  . Codeine Nausea And Vomiting  . Crestor [Rosuvastatin] Other (See Comments)    Muscle aches  . Doxycycline Nausea Only  . Ecotrin [Aspirin] Other (See Comments)    BLEEDING ULCER - ONLY IN HIGH DOSES  . Lipitor [Atorvastatin] Other (See Comments)    Muscle aches  . Nsaids Other (See Comments)    BLEEDING ULCER  . Penicillins Rash  . Sulfa Drugs Cross Reactors Nausea And Vomiting  . Vitamin C Other (See Comments)    Upsets stomach  . Zocor [Simvastatin] Other (See Comments)    Muscle aches     Current Outpatient Prescriptions  Medication Sig Dispense Refill  . aspirin EC 81 MG tablet Take 81 mg by mouth daily.    . carvedilol (COREG) 25 MG tablet TAKE ONE & ONE-HALF TABLETS BY MOUTH TWICE DAILY 270 tablet 2  . cetirizine (ZYRTEC) 10 MG tablet Take 10 mg by mouth daily as needed for allergies.    . Cholecalciferol (VITAMIN D) 2000 UNITS CAPS Take 1 capsule (2,000 Units total) by mouth daily. 30 capsule   . citalopram (CELEXA) 40 MG tablet Take 40 mg by mouth daily.     Marland Kitchen ezetimibe (ZETIA) 10 MG tablet Take 1 tablet (10 mg total) by mouth daily. 30 tablet 6  . fexofenadine (ALLEGRA) 180 MG tablet Take 180 mg by mouth daily as needed for allergies or rhinitis.    . furosemide (LASIX) 40 MG tablet TAKE ONE TABLET BY MOUTH ONCE DAILY 90 tablet 0  . HYDROcodone-acetaminophen (NORCO/VICODIN) 5-325 MG per tablet Take 1-2 tablets by mouth every 4 (four) hours as needed for moderate pain. 10 tablet 0  . lisinopril (PRINIVIL,ZESTRIL) 20 MG tablet TAKE ONE TABLET BY MOUTH ONCE DAILY 90 tablet 0  .  montelukast (SINGULAIR) 10 MG tablet Take 10 mg by mouth daily.   0  . potassium chloride (K-DUR,KLOR-CON) 10 MEQ tablet TAKE ONE TABLET BY MOUTH ONCE DAILY 30 tablet 11  . pravastatin (PRAVACHOL) 20 MG tablet TAKE ONE TABLET BY MOUTH ONCE DAILY 90 tablet 3  . vitamin B-12 (CYANOCOBALAMIN) 100 MCG tablet Take 1 tablet by mouth daily.     No current facility-administered medications for this visit.      Past Medical History:  Diagnosis Date  . AICD (automatic cardioverter/defibrillator) present 12/08/2014  . CAD (coronary artery disease)    CHRONICALLY OCCLUDED LAD WITH A LARGE ANTERIOR WALL MI   . CHF (congestive heart failure) (HCC)    EF 30-40%  . Dyslipidemia   . Hypertension   . ICD (implantable cardiac defibrillator), single, in situ    MEDTRONIC  . Ischemic cardiomyopathy   . Myocardial infarction (HCC)    ANTERIOR WALL    ROS:   All systems reviewed and negative except as noted in the HPI.   Past Surgical History:  Procedure Laterality Date  . CARDIAC CATHETERIZATION  01/13/05, 06/29/99  . CARDIAC DEFIBRILLATOR PLACEMENT  02/21/05   SINGLE/MEDTRONIC  . EP IMPLANTABLE DEVICE N/A 11/14/2014   Procedure:  ICD Generator Changeout with new lead & capping old [phone] lead;  Surgeon: Marinus Maw, MD;  Location: [facility] INVASIVE CV LAB;  Service: Cardiovascular;  Laterality: N/A;  . EP IMPLANTABLE DEVICE N/A 12/08/2014   Procedure: ICD Implant;  Surgeon: Marinus Maw, MD;  Location: [facility] INVASIVE CV LAB;  Service: Cardiovascular;  Laterality: N/A;     Family History  Problem Relation Age of Onset  . Heart attack Mother   . Coronary artery disease Mother   . Aneurysm Brother   . Hypertension Father      Social History   Social History  . Marital status: Married    Spouse name: N/A  . Number of children: N/A  . Years of education: N/A   Occupational History  . Not on file.   Social History Main Topics  . Smoking status: Never Smoker  . Smokeless tobacco: Never Used   . Alcohol use No  . Drug use: No  . Sexual activity: Not on file   Other Topics Concern  . Not on file   Social History Narrative  . No narrative on file     BP 120/80   Pulse 71   Ht  (1.575 m)   Wt 243 lb 6.4 oz (110.4 kg)   SpO2 96%   BMI 44.52 kg/m   Physical Exam:  Obese but otherwise Well appearing woman, NAD HEENT: Unremarkable Neck:  6 cm JVD, no thyromegally Lymphatics:  No adenopathy Back:  No CVA tenderness Lungs:  Clear, with no wheezes, rales, or rhonchi. HEART:  Regular rate rhythm, no murmurs, no rubs, no clicks, heart sounds are distant Abd:  soft, positive bowel sounds, no organomegally, no rebound, no guarding Ext:  2 plus pulses, no edema, no cyanosis, no clubbing Skin:  No rashes no nodules Neuro:  CN II through XII intact, motor grossly intact  EKG - normal sinus rhythm with remote anterior MI  DEVICE  Normal device function.  See PaceArt for details.   Assess/Plan: 1. Ischemic cardiomyopathy - she has no anginal symptoms. She will continue her current medications 2. ICD - her Medtronic single-chamber ICD is working normally. She has approximately 10 years of battery longevity. She will follow-up in several months. 3. Chronic systolic heart failure - her symptoms are class II. No change in medications. She is encouraged to lose weight.   Lewayne Bunting, M.D.

## 2016-12-21 ENCOUNTER — Telehealth: Payer: Self-pay | Admitting: *Deleted

## 2016-12-21 MED ORDER — POTASSIUM CHLORIDE CRYS ER 10 MEQ PO TBCR: 10.0000 meq | EXTENDED_RELEASE_TABLET | Freq: Every day | ORAL | 3 refills | Status: DC

## 2016-12-21 NOTE — Telephone Encounter (Signed)
Patient notified by Eligha Bridegroom today at 3:46 pm.

## 2016-12-21 NOTE — Telephone Encounter (Signed)
-----   Message from Levi Aland, RN sent at 12/21/2016 10:15 AM EDT ----- Routing to CMA pool

## 2016-12-21 NOTE — Addendum Note (Signed)
Addended by: Levi Aland on: 12/21/2016 03:48 PM   Modules accepted: Orders

## 2016-12-22 LAB — CUP PACEART INCLINIC DEVICE CHECK
Battery Remaining Longevity: 124 mo
Battery Voltage: 3.03 V
Brady Statistic RV Percent Paced: 0.01 %
HighPow Impedance: 85 Ohm
Implantable Lead Implant Date: 20160919
Implantable Lead Model: 6935
Implantable Pulse Generator Implant Date: 20160919
Lead Channel Pacing Threshold Amplitude: 0.75 V
Lead Channel Pacing Threshold Pulse Width: 0.4 ms
Lead Channel Setting Pacing Amplitude: 2 V
Lead Channel Setting Pacing Pulse Width: 0.4 ms
Lead Channel Setting Sensing Sensitivity: 0.3 mV
MDC IDC LEAD LOCATION: 753860
MDC IDC MSMT LEADCHNL RV IMPEDANCE VALUE: 456 Ohm
MDC IDC MSMT LEADCHNL RV IMPEDANCE VALUE: 532 Ohm
MDC IDC MSMT LEADCHNL RV SENSING INTR AMPL: 16 mV
MDC IDC MSMT LEADCHNL RV SENSING INTR AMPL: 17 mV
MDC IDC SESS DTM: 20181002132015

## 2017-01-16 ENCOUNTER — Other Ambulatory Visit: Payer: Self-pay | Admitting: Internal Medicine

## 2017-02-20 ENCOUNTER — Other Ambulatory Visit: Payer: Self-pay | Admitting: Cardiovascular Disease

## 2017-03-06 ENCOUNTER — Ambulatory Visit (INDEPENDENT_AMBULATORY_CARE_PROVIDER_SITE_OTHER): Payer: Medicare Other | Admitting: *Deleted

## 2017-03-06 DIAGNOSIS — I255 Ischemic cardiomyopathy: Secondary | ICD-10-CM

## 2017-03-06 NOTE — Progress Notes (Signed)
Remote ICD transmission.   

## 2017-03-08 ENCOUNTER — Encounter: Payer: Self-pay | Admitting: Cardiology

## 2017-03-08 LAB — CUP PACEART REMOTE DEVICE CHECK
Battery Remaining Longevity: 123 mo
Battery Voltage: 3.03 V
Brady Statistic RV Percent Paced: 0.01 %
Date Time Interrogation Session: 20181217062502
HighPow Impedance: 87 Ohm
Implantable Lead Location: 753860
Implantable Lead Model: 6935
Lead Channel Impedance Value: 532 Ohm
Lead Channel Setting Pacing Amplitude: 2 V
Lead Channel Setting Pacing Pulse Width: 0.4 ms
Lead Channel Setting Sensing Sensitivity: 0.3 mV
MDC IDC LEAD IMPLANT DT: 20160919
MDC IDC MSMT LEADCHNL RV IMPEDANCE VALUE: 456 Ohm
MDC IDC MSMT LEADCHNL RV PACING THRESHOLD AMPLITUDE: 0.875 V
MDC IDC MSMT LEADCHNL RV PACING THRESHOLD PULSEWIDTH: 0.4 ms
MDC IDC MSMT LEADCHNL RV SENSING INTR AMPL: 15 mV
MDC IDC MSMT LEADCHNL RV SENSING INTR AMPL: 15 mV
MDC IDC PG IMPLANT DT: 20160919

## 2017-06-05 ENCOUNTER — Ambulatory Visit (INDEPENDENT_AMBULATORY_CARE_PROVIDER_SITE_OTHER): Payer: Medicare Other | Admitting: *Deleted

## 2017-06-05 DIAGNOSIS — I5022 Chronic systolic (congestive) heart failure: Secondary | ICD-10-CM

## 2017-06-05 DIAGNOSIS — I255 Ischemic cardiomyopathy: Secondary | ICD-10-CM | POA: Diagnosis not present

## 2017-06-05 NOTE — Progress Notes (Signed)
Remote ICD transmission.   

## 2017-06-06 LAB — CUP PACEART REMOTE DEVICE CHECK
Brady Statistic RV Percent Paced: 0.01 %
Date Time Interrogation Session: 20190318052404
HIGH POWER IMPEDANCE MEASURED VALUE: 95 Ohm
Implantable Lead Implant Date: 20160919
Implantable Lead Model: 6935
Lead Channel Impedance Value: 456 Ohm
Lead Channel Impedance Value: 551 Ohm
Lead Channel Pacing Threshold Amplitude: 0.875 V
Lead Channel Sensing Intrinsic Amplitude: 12.875 mV
Lead Channel Sensing Intrinsic Amplitude: 12.875 mV
Lead Channel Setting Pacing Amplitude: 2 V
MDC IDC LEAD LOCATION: 753860
MDC IDC MSMT BATTERY REMAINING LONGEVITY: 121 mo
MDC IDC MSMT BATTERY VOLTAGE: 3.02 V
MDC IDC MSMT LEADCHNL RV PACING THRESHOLD PULSEWIDTH: 0.4 ms
MDC IDC PG IMPLANT DT: 20160919
MDC IDC SET LEADCHNL RV PACING PULSEWIDTH: 0.4 ms
MDC IDC SET LEADCHNL RV SENSING SENSITIVITY: 0.3 mV

## 2017-06-07 ENCOUNTER — Encounter: Payer: Self-pay | Admitting: Cardiology

## 2017-08-29 ENCOUNTER — Other Ambulatory Visit: Payer: Self-pay | Admitting: Internal Medicine

## 2017-09-04 ENCOUNTER — Ambulatory Visit (INDEPENDENT_AMBULATORY_CARE_PROVIDER_SITE_OTHER): Payer: Medicare Other | Admitting: *Deleted

## 2017-09-04 DIAGNOSIS — I255 Ischemic cardiomyopathy: Secondary | ICD-10-CM

## 2017-09-04 DIAGNOSIS — I5022 Chronic systolic (congestive) heart failure: Secondary | ICD-10-CM

## 2017-09-05 ENCOUNTER — Encounter: Payer: Self-pay | Admitting: Cardiology

## 2017-09-05 LAB — CUP PACEART REMOTE DEVICE CHECK
Battery Remaining Longevity: 118 mo
HighPow Impedance: 97 Ohm
Implantable Lead Implant Date: 20160919
Implantable Lead Location: 753860
Implantable Lead Model: 6935
Implantable Pulse Generator Implant Date: 20160919
Lead Channel Impedance Value: 551 Ohm
Lead Channel Pacing Threshold Amplitude: 0.875 V
Lead Channel Sensing Intrinsic Amplitude: 12.125 mV
Lead Channel Setting Pacing Pulse Width: 0.4 ms
Lead Channel Setting Sensing Sensitivity: 0.3 mV
MDC IDC MSMT BATTERY VOLTAGE: 3.02 V
MDC IDC MSMT LEADCHNL RV IMPEDANCE VALUE: 456 Ohm
MDC IDC MSMT LEADCHNL RV PACING THRESHOLD PULSEWIDTH: 0.4 ms
MDC IDC MSMT LEADCHNL RV SENSING INTR AMPL: 12.125 mV
MDC IDC SESS DTM: 20190617041603
MDC IDC SET LEADCHNL RV PACING AMPLITUDE: 2 V
MDC IDC STAT BRADY RV PERCENT PACED: 0.01 %

## 2017-09-05 NOTE — Progress Notes (Signed)
Remote ICD transmission.   

## 2017-12-04 ENCOUNTER — Ambulatory Visit (INDEPENDENT_AMBULATORY_CARE_PROVIDER_SITE_OTHER): Payer: Medicare Other | Admitting: *Deleted

## 2017-12-04 DIAGNOSIS — I255 Ischemic cardiomyopathy: Secondary | ICD-10-CM | POA: Diagnosis not present

## 2017-12-04 NOTE — Progress Notes (Signed)
Remote ICD transmission.   

## 2017-12-27 LAB — CUP PACEART REMOTE DEVICE CHECK
HIGH POWER IMPEDANCE MEASURED VALUE: 86 Ohm
Implantable Lead Implant Date: 20160919
Implantable Pulse Generator Implant Date: 20160919
Lead Channel Impedance Value: 418 Ohm
Lead Channel Pacing Threshold Amplitude: 0.875 V
Lead Channel Pacing Threshold Pulse Width: 0.4 ms
Lead Channel Sensing Intrinsic Amplitude: 16.375 mV
Lead Channel Setting Sensing Sensitivity: 0.3 mV
MDC IDC LEAD LOCATION: 753860
MDC IDC MSMT BATTERY REMAINING LONGEVITY: 115 mo
MDC IDC MSMT BATTERY VOLTAGE: 3.01 V
MDC IDC MSMT LEADCHNL RV IMPEDANCE VALUE: 532 Ohm
MDC IDC MSMT LEADCHNL RV SENSING INTR AMPL: 16.375 mV
MDC IDC SESS DTM: 20190916084224
MDC IDC SET LEADCHNL RV PACING AMPLITUDE: 2 V
MDC IDC SET LEADCHNL RV PACING PULSEWIDTH: 0.4 ms
MDC IDC STAT BRADY RV PERCENT PACED: 0.01 %

## 2017-12-28 ENCOUNTER — Encounter: Payer: Self-pay | Admitting: *Deleted

## 2017-12-31 ENCOUNTER — Other Ambulatory Visit: Payer: Self-pay | Admitting: Cardiovascular Disease

## 2018-01-08 ENCOUNTER — Other Ambulatory Visit: Payer: Self-pay | Admitting: Cardiovascular Disease

## 2018-01-11 ENCOUNTER — Encounter: Payer: Self-pay | Admitting: Cardiovascular Disease

## 2018-01-11 ENCOUNTER — Ambulatory Visit: Payer: Medicare Other | Admitting: Cardiovascular Disease

## 2018-01-11 VITALS — BP 104/62 | HR 66 | Ht 62.0 in | Wt 239.0 lb

## 2018-01-11 DIAGNOSIS — I5022 Chronic systolic (congestive) heart failure: Secondary | ICD-10-CM

## 2018-01-11 DIAGNOSIS — E785 Hyperlipidemia, unspecified: Secondary | ICD-10-CM

## 2018-01-11 DIAGNOSIS — I255 Ischemic cardiomyopathy: Secondary | ICD-10-CM

## 2018-01-11 MED ORDER — PRAVASTATIN SODIUM 20 MG PO TABS: 20.0000 mg | ORAL_TABLET | Freq: Every day | ORAL | 3 refills | Status: DC

## 2018-01-11 MED ORDER — POTASSIUM CHLORIDE CRYS ER 10 MEQ PO TBCR: 10.0000 meq | EXTENDED_RELEASE_TABLET | Freq: Every day | ORAL | 3 refills | Status: DC

## 2018-01-11 NOTE — Patient Instructions (Signed)
Medication Instructions:  Your physician recommends that you continue on your current medications as directed. Please refer to the Current Medication list given to you today.  If you need a refill on your cardiac medications before your next appointment, please call your pharmacy.   Lab work: None Ordered    Testing/Procedures: Your physician has requested that you have an echocardiogram. Echocardiography is a painless test that uses sound waves to create images of your heart. It provides your doctor with information about the size and shape of your heart and how well your heart's chambers and valves are working. This procedure takes approximately one hour. There are no restrictions for this procedure.   Follow-Up: At [facility], you and your health needs are our priority.  As part of our continuing mission to provide you with exceptional heart care, we have created designated Provider Care Teams.  These Care Teams include your primary Cardiologist (physician) and Advanced Practice Providers (APPs -  Physician Assistants and Nurse Practitioners) who all work together to provide you with the care you need, when you need it. You will need a follow up appointment in:  1 year.  Please call our office 2 months in advance to schedule this appointment.  You may see Kristeen Miss, MD or one of the following Advanced Practice Providers on your designated Care Team: Tereso Newcomer, PA-C [place], [place] . Berton Bon, NP

## 2018-01-11 NOTE — Progress Notes (Signed)
Cardiology Office Note   Date:  01/11/2018   ID:  [name], DOB 08/15/1950, MRN [id number]  PCP:  Nancy Ping, MD  Cardiologist:   Nancy Miss, MD   Chief Complaint  Patient presents with  . Coronary Artery Disease  . Congestive Heart Failure   1. Coronary artery disease-status post chronic LAD occlusion with a large anterior wall myocardial infarction 2. Congestive heart failure-EF of 30-40% 3. AICD placement 4. Hyperlipidemia 5 obesity  History of Present Illness:  [name] is doing very well from a cardiac standpoint. She's not had any episodes of chest pain or shortness of breath. She is able to do all of her normal activities without any significant problems. She has not had much success in losing weight  She denies any syncope or presyncope.  September 14, 2012:  [name] is doing well. She's not having any episodes of chest pain or shortness of breath. Her ICD has been interrogated recently and was found to be working properly. She has no CP or dyspnea when she does her regular activities. She does not get any regular exercise.   June 13, 2013:  [name] is doing ok. She is exercising some. Walks about 20 minutes a day - several days a week. No CP or dyspnea.    Feb. 25, 2016:  [name] is a 66 y.o. female who presents for follow up of her CAD and CHF.   Her ICD is working well.  She will need to have the battery checked more often recently because  it is nearing ERI.  She is not getting exercise.   Denies any chest pain  Or dyspnea.   Needs to lose weight.  [facility]   November 12, 2014:  Patient is doing well. No cp or dyspnea. Perhaps getting some exercise  Sept. 1, 2017:  Is doing well.  Followed for CHF and ICD placement  Has hx of chronic systlic CHF Breathing is good.  No CP   Oct. 2, 2018:    [name] is seen today for follow up ov her CAD, CHF and ICD placement .    Wt = 243 lbs. ( up 4 lbs since last  years visit )   January 11, 2018:   [name] is seen today for follow-up of her coronary artery disease, congestive heart failure and ICD placement.  Her weight today is 239 pounds. Still eating salty foods   - hot dogs once a week , bacon once month,  Canned veggies weekly .     Past Medical History:  Diagnosis Date  . AICD (automatic cardioverter/defibrillator) present 12/08/2014  . CAD (coronary artery disease)    CHRONICALLY OCCLUDED LAD WITH A LARGE ANTERIOR WALL MI   . CHF (congestive heart failure) (HCC)    EF 30-40%  . Dyslipidemia   . Hypertension   . ICD (implantable cardiac defibrillator), single, in situ    MEDTRONIC  . Ischemic cardiomyopathy   . Myocardial infarction [facility])    ANTERIOR WALL    Past Surgical History:  Procedure Laterality Date  . CARDIAC CATHETERIZATION  01/13/05, 06/29/99  . CARDIAC DEFIBRILLATOR PLACEMENT  02/21/05   SINGLE/MEDTRONIC  . EP IMPLANTABLE DEVICE N/A 11/14/2014   Procedure:  ICD Generator Changeout with new lead & capping old [phone] lead;  Surgeon: Marinus Maw, MD;  Location: [facility] INVASIVE CV LAB;  Service: Cardiovascular;  Laterality: N/A;  . EP IMPLANTABLE DEVICE N/A 12/08/2014  Procedure: ICD Implant;  Surgeon: Marinus Maw, MD;  Location: [facility] INVASIVE CV LAB;  Service: Cardiovascular;  Laterality: N/A;     Current Outpatient Medications  Medication Sig Dispense Refill  . aspirin EC 81 MG tablet Take 81 mg by mouth daily.    . carvedilol (COREG) 25 MG tablet TAKE 1 & 1/2 (ONE & ONE-HALF) TABLETS BY MOUTH TWICE DAILY 270 tablet 1  . cetirizine (ZYRTEC) 10 MG tablet Take 10 mg by mouth daily as needed for allergies.    . Cholecalciferol (VITAMIN D) 2000 UNITS CAPS Take 1 capsule (2,000 Units total) by mouth daily. 30 capsule   . citalopram (CELEXA) 40 MG tablet Take 40 mg by mouth daily.     Marland Kitchen ezetimibe (ZETIA) 10 MG tablet Take 1 tablet (10 mg total) by mouth daily. 30 tablet 6  . fexofenadine (ALLEGRA) 180 MG tablet Take 180 mg by  mouth daily as needed for allergies or rhinitis.    . furosemide (LASIX) 40 MG tablet TAKE 1 TABLET BY MOUTH ONCE DAILY 90 tablet 3  . lisinopril (PRINIVIL,ZESTRIL) 20 MG tablet TAKE 1 TABLET BY MOUTH ONCE DAILY 90 tablet 3  . montelukast (SINGULAIR) 10 MG tablet Take 10 mg by mouth daily.   0  . potassium chloride (K-DUR,KLOR-CON) 10 MEQ tablet Take 10 mEq by mouth daily.    . pravastatin (PRAVACHOL) 20 MG tablet Take 1 tablet by mouth daily.    . vitamin B-12 (CYANOCOBALAMIN) 100 MCG tablet Take 1 tablet by mouth daily.     No current facility-administered medications for this visit.     Allergies:   Codeine; Crestor [rosuvastatin]; Doxycycline; Ecotrin [aspirin]; Lipitor [atorvastatin]; Nsaids; Penicillins; Sulfa drugs cross reactors; Vitamin c; and Zocor [simvastatin]    Social History:  The patient  reports that she has never smoked. She has never used smokeless tobacco. She reports that she does not drink alcohol or use drugs.   Family History:  The patient's family history includes Aneurysm in her brother; Coronary artery disease in her mother; Heart attack (age of onset: 52) in her mother; Hypertension in her father.    ROS:  Please see the history of present illness.     Physical Exam: Blood pressure 104/62, pulse 66, height 5\' 2"  (1.575 m), weight 239 lb (108.4 kg), SpO2 97 %.  GEN:  Obese , middle age female  HEENT: Normal NECK: No JVD; No carotid bruits LYMPHATICS: No lymphadenopathy CARDIAC: RRR   RESPIRATORY:  Clear to auscultation without rales, wheezing or rhonchi  ABDOMEN:    Obese  MUSCULOSKELETAL:  No edema; No deformity  SKIN: Warm and dry NEUROLOGIC:  Alert and oriented x 3   EKG:   January 11, 2018: Normal sinus rhythm at 66.  Low voltage QRS.  Nonspecific ST and T wave abnormalities.   Recent Labs: No results found for requested labs within last 8760 hours.    Lipid Panel    Component Value Date/Time   CHOL 159 12/20/2016 1039   TRIG 122  12/20/2016 1039   HDL 44 12/20/2016 1039   CHOLHDL 3.6 12/20/2016 1039   CHOLHDL 3 11/10/2014 0926   VLDL 18.6 11/10/2014 0926   LDLCALC 91 12/20/2016 1039      Wt Readings from Last 3 Encounters:  01/11/18 239 lb (108.4 kg)  12/20/16 243 lb 9.6 oz (110.5 kg)  12/20/16 243 lb 6.4 oz (110.4 kg)      Other studies Reviewed: Additional studies/ records that were reviewed today include: .  Review of the above records demonstrates:    ASSESSMENT AND PLAN:  1. Coronary artery disease-    No angina    2. Congestive heart failure-EF of 30-40% -    Still eats salty foods. Advised her to stay away from salt Last echo was 3 years ago.   Will repeat echo    3. AICD placement -   Followed by EP   4. Hyperlipidemia -    Managed by primary MD   5 obesity - advised weight loss    Current medicines are reviewed at length with the patient today.  The patient does not have concerns regarding medicines.  The following changes have been made:  no change   Disposition:   FU with 1 year   Signed, Nancy Miss, MD  01/11/2018 12:32 PM    [facility] [address] [place], [place], [place]  [address] Phone: [phone]; Fax: [phone]

## 2018-01-12 ENCOUNTER — Ambulatory Visit: Payer: Medicare Other | Admitting: Cardiovascular Disease

## 2018-01-19 ENCOUNTER — Other Ambulatory Visit: Payer: Self-pay

## 2018-01-19 ENCOUNTER — Ambulatory Visit (HOSPITAL_COMMUNITY): Payer: Medicare Other | Attending: Cardiovascular Disease

## 2018-01-19 ENCOUNTER — Ambulatory Visit: Payer: Medicare Other | Admitting: Internal Medicine

## 2018-01-19 ENCOUNTER — Encounter: Payer: Self-pay | Admitting: Internal Medicine

## 2018-01-19 ENCOUNTER — Other Ambulatory Visit: Payer: Self-pay | Admitting: *Deleted

## 2018-01-19 VITALS — BP 124/62 | HR 63 | Ht 62.0 in | Wt 239.0 lb

## 2018-01-19 DIAGNOSIS — I5022 Chronic systolic (congestive) heart failure: Secondary | ICD-10-CM | POA: Diagnosis present

## 2018-01-19 DIAGNOSIS — Z95810 Presence of automatic (implantable) cardiac defibrillator: Secondary | ICD-10-CM

## 2018-01-19 DIAGNOSIS — I255 Ischemic cardiomyopathy: Secondary | ICD-10-CM | POA: Diagnosis not present

## 2018-01-19 LAB — CUP PACEART INCLINIC DEVICE CHECK
Battery Remaining Longevity: 115 mo
Brady Statistic RV Percent Paced: 0.01 %
Date Time Interrogation Session: 20191101090953
HighPow Impedance: 88 Ohm
Implantable Lead Implant Date: 20160919
Implantable Lead Location: 753860
Implantable Lead Model: 6935
Implantable Pulse Generator Implant Date: 20160919
Lead Channel Impedance Value: 532 Ohm
Lead Channel Pacing Threshold Amplitude: 1 V
Lead Channel Pacing Threshold Pulse Width: 0.4 ms
Lead Channel Setting Pacing Amplitude: 2 V
Lead Channel Setting Sensing Sensitivity: 0.3 mV
MDC IDC MSMT BATTERY VOLTAGE: 3.02 V
MDC IDC MSMT LEADCHNL RV IMPEDANCE VALUE: 418 Ohm
MDC IDC MSMT LEADCHNL RV SENSING INTR AMPL: 18.875 mV
MDC IDC SET LEADCHNL RV PACING PULSEWIDTH: 0.4 ms

## 2018-01-19 LAB — ECHOCARDIOGRAM COMPLETE
HEIGHTINCHES: 62 in
Weight: 3824 oz

## 2018-01-19 MED ORDER — LISINOPRIL 20 MG PO TABS: 20.0000 mg | ORAL_TABLET | Freq: Every day | ORAL | 3 refills | Status: DC

## 2018-01-19 MED ORDER — EZETIMIBE 10 MG PO TABS: 10.0000 mg | ORAL_TABLET | Freq: Every day | ORAL | 3 refills | Status: DC

## 2018-01-19 MED ORDER — FUROSEMIDE 40 MG PO TABS: 40.0000 mg | ORAL_TABLET | Freq: Every day | ORAL | 3 refills | Status: DC

## 2018-01-19 MED ORDER — CARVEDILOL 25 MG PO TABS: ORAL_TABLET | ORAL | 3 refills | Status: DC

## 2018-01-19 MED ORDER — PERFLUTREN LIPID MICROSPHERE
1.0000 mL | INTRAVENOUS | Status: AC | PRN
  Administered 2018-01-19: 2 mL via INTRAVENOUS

## 2018-01-19 NOTE — Progress Notes (Signed)
HPI [name] returns today for followup. She is a very pleasant 67 year old woman with coronary artery disease status post myocardial infarction, with a chronically occluded LAD, class IIa congestive heart failure, and morbid obesity. In the interim she has had no ICD shocks. She is still not lost any weight. She denies chest pain or shortness of breath. She has joined [employer] and lost 4 lbs. Allergies  Allergen Reactions  . Codeine Nausea And Vomiting  . Crestor [Rosuvastatin] Other (See Comments)    Muscle aches  . Doxycycline Nausea Only  . Ecotrin [Aspirin] Other (See Comments)    BLEEDING ULCER - ONLY IN HIGH DOSES  . Lipitor [Atorvastatin] Other (See Comments)    Muscle aches  . Nsaids Other (See Comments)    BLEEDING ULCER  . Penicillins Rash  . Sulfa Drugs Cross Reactors Nausea And Vomiting  . Vitamin C Other (See Comments)    Upsets stomach  . Zocor [Simvastatin] Other (See Comments)    Muscle aches     Current Outpatient Medications  Medication Sig Dispense Refill  . aspirin EC 81 MG tablet Take 81 mg by mouth daily.    . carvedilol (COREG) 25 MG tablet TAKE 1 & 1/2 (ONE & ONE-HALF) TABLETS BY MOUTH TWICE DAILY 270 tablet 1  . cetirizine (ZYRTEC) 10 MG tablet Take 10 mg by mouth daily as needed for allergies.    . Cholecalciferol (VITAMIN D) 2000 UNITS CAPS Take 1 capsule (2,000 Units total) by mouth daily. 30 capsule   . citalopram (CELEXA) 40 MG tablet Take 40 mg by mouth daily.     Marland Kitchen ezetimibe (ZETIA) 10 MG tablet Take 1 tablet (10 mg total) by mouth daily. 30 tablet 6  . fexofenadine (ALLEGRA) 180 MG tablet Take 180 mg by mouth daily as needed for allergies or rhinitis.    . furosemide (LASIX) 40 MG tablet TAKE 1 TABLET BY MOUTH ONCE DAILY 90 tablet 3  . lisinopril (PRINIVIL,ZESTRIL) 20 MG tablet TAKE 1 TABLET BY MOUTH ONCE DAILY 90 tablet 3  . montelukast (SINGULAIR) 10 MG tablet Take 10 mg by mouth daily.   0  . potassium chloride  (K-DUR,KLOR-CON) 10 MEQ tablet Take 1 tablet (10 mEq total) by mouth daily. 90 tablet 3  . pravastatin (PRAVACHOL) 20 MG tablet Take 1 tablet (20 mg total) by mouth daily. 90 tablet 3  . vitamin B-12 (CYANOCOBALAMIN) 100 MCG tablet Take 1 tablet by mouth daily.     No current facility-administered medications for this visit.      Past Medical History:  Diagnosis Date  . AICD (automatic cardioverter/defibrillator) present 12/08/2014  . CAD (coronary artery disease)    CHRONICALLY OCCLUDED LAD WITH A LARGE ANTERIOR WALL MI   . CHF (congestive heart failure) (HCC)    EF 30-40%  . Dyslipidemia   . Hypertension   . ICD (implantable cardiac defibrillator), single, in situ    MEDTRONIC  . Ischemic cardiomyopathy   . Myocardial infarction (HCC)    ANTERIOR WALL    ROS:   All systems reviewed and negative except as noted in the HPI.   Past Surgical History:  Procedure Laterality Date  . CARDIAC CATHETERIZATION  01/13/05, 06/29/99  . CARDIAC DEFIBRILLATOR PLACEMENT  02/21/05   SINGLE/MEDTRONIC  . EP IMPLANTABLE DEVICE N/A 11/14/2014   Procedure:  ICD Generator Changeout with new lead & capping old [phone] lead;  Surgeon: Marinus Maw, MD;  Location: [facility] INVASIVE CV LAB;  Service: Cardiovascular;  Laterality: N/A;  . EP IMPLANTABLE DEVICE N/A 12/08/2014   Procedure: ICD Implant;  Surgeon: Marinus Maw, MD;  Location: [facility] INVASIVE CV LAB;  Service: Cardiovascular;  Laterality: N/A;     Family History  Problem Relation Age of Onset  . Heart attack Mother 44  . Coronary artery disease Mother   . Aneurysm Brother   . Hypertension Father      Social History   Socioeconomic History  . Marital status: Married    Spouse name: Not on file  . Number of children: Not on file  . Years of education: Not on file  . Highest education level: Not on file  Occupational History  . Not on file  Social Needs  . Financial resource strain: Not on file  . Food insecurity:    Worry: Not on file     Inability: Not on file  . Transportation needs:    Medical: Not on file    Non-medical: Not on file  Tobacco Use  . Smoking status: Never Smoker  . Smokeless tobacco: Never Used  Substance and Sexual Activity  . Alcohol use: No  . Drug use: No  . Sexual activity: Not on file  Lifestyle  . Physical activity:    Days per week: Not on file    Minutes per session: Not on file  . Stress: Not on file  Relationships  . Social connections:    Talks on phone: Not on file    Gets together: Not on file    Attends religious service: Not on file    Active member of club or organization: Not on file    Attends meetings of clubs or organizations: Not on file    Relationship status: Not on file  . Intimate partner violence:    Fear of current or ex partner: Not on file    Emotionally abused: Not on file    Physically abused: Not on file    Forced sexual activity: Not on file  Other Topics Concern  . Not on file  Social History Narrative  . Not on file     BP 124/62   Pulse 63   Ht 5\' 2"  (1.575 m)   Wt 239 lb (108.4 kg)   SpO2 98%   BMI 43.71 kg/m   Physical Exam:  Well appearing NAD HEENT: Unremarkable Neck:  No JVD, no thyromegally Lymphatics:  No adenopathy Back:  No CVA tenderness Lungs:  Clear with no wheezes HEART:  Regular rate rhythm, no murmurs, no rubs, no clicks Abd:  soft, positive bowel sounds, no organomegally, no rebound, no guarding Ext:  2 plus pulses, no edema, no cyanosis, no clubbing Skin:  No rashes no nodules Neuro:  CN II through XII intact, motor grossly intact  DEVICE  Normal device function.  See PaceArt for details.   Assess/Plan: 1. Chronic systolic heart failure - her symptoms are class 2. She will continue her current meds. 2. Obesity - she has lost 4 lbs and hopes to lose more. 3. ICD - her medtronic single chamber ICD is working normally.  4. CAD - she denies anginal symptoms. I encouraged the patient to increase her physical  activity.  Leonia Reeves.D.

## 2018-01-19 NOTE — Patient Instructions (Signed)
Medication Instructions:  Your physician recommends that you continue on your current medications as directed. Please refer to the Current Medication list given to you today.  If you need a refill on your cardiac medications before your next appointment, please call your pharmacy.   Lab work: None ordered.  If you have labs (blood work) drawn today and your tests are completely normal, you will receive your results only by: Marland Kitchen MyChart Message (if you have MyChart) OR . A paper copy in the mail If you have any lab test that is abnormal or we need to change your treatment, we will call you to review the results.  Testing/Procedures: None ordered.  Follow-Up:  Your physician wants you to follow-up in: one year with Dr. Ladona Ridgel.    You will receive a reminder letter in the mail two months in advance. If you don't receive a letter, please call our office to schedule the follow-up appointment.  Remote monitoring is used to monitor your ICD from home. This monitoring reduces the number of office visits required to check your device to one time per year. It allows [place] to keep an eye on the functioning of your device to ensure it is working properly. You are scheduled for a device check from home on 03/05/2018. You may send your transmission at any time that day. If you have a wireless device, the transmission will be sent automatically. After your physician reviews your transmission, you will receive a postcard with your next transmission date.  At [facility], you and your health needs are our priority.  As part of our continuing mission to provide you with exceptional heart care, we have created designated Provider Care Teams.  These Care Teams include your primary Cardiologist (physician) and Advanced Practice Providers (APPs -  Physician Assistants and Nurse Practitioners) who all work together to provide you with the care you need, when you need it. Bonita Quin may see Lewayne Bunting, MD or one of the  following Advanced Practice Providers on your designated Care Team:   . Gypsy Balsam, NP . Francis Dowse, PA-C   Any Other Special Instructions Will Be Listed Below (If Applicable).

## 2018-01-29 ENCOUNTER — Other Ambulatory Visit (HOSPITAL_COMMUNITY): Payer: Medicare Other

## 2018-03-05 ENCOUNTER — Ambulatory Visit (INDEPENDENT_AMBULATORY_CARE_PROVIDER_SITE_OTHER): Payer: Medicare Other

## 2018-03-05 DIAGNOSIS — I255 Ischemic cardiomyopathy: Secondary | ICD-10-CM

## 2018-03-05 DIAGNOSIS — I5022 Chronic systolic (congestive) heart failure: Secondary | ICD-10-CM

## 2018-03-05 NOTE — Progress Notes (Signed)
Remote ICD transmission.   

## 2018-04-12 LAB — CUP PACEART REMOTE DEVICE CHECK
Battery Remaining Longevity: 111 mo
Battery Voltage: 3.01 V
HighPow Impedance: 88 Ohm
Implantable Lead Location: 753860
Implantable Lead Model: 6935
Implantable Pulse Generator Implant Date: 20160919
Lead Channel Impedance Value: 456 Ohm
Lead Channel Impedance Value: 551 Ohm
Lead Channel Pacing Threshold Amplitude: 1 V
Lead Channel Sensing Intrinsic Amplitude: 14.5 mV
Lead Channel Setting Pacing Amplitude: 2 V
Lead Channel Setting Pacing Pulse Width: 0.4 ms
Lead Channel Setting Sensing Sensitivity: 0.3 mV
MDC IDC LEAD IMPLANT DT: 20160919
MDC IDC MSMT LEADCHNL RV PACING THRESHOLD PULSEWIDTH: 0.4 ms
MDC IDC MSMT LEADCHNL RV SENSING INTR AMPL: 14.5 mV
MDC IDC SESS DTM: 20191216093724
MDC IDC STAT BRADY RV PERCENT PACED: 0.01 %

## 2018-06-04 ENCOUNTER — Ambulatory Visit (INDEPENDENT_AMBULATORY_CARE_PROVIDER_SITE_OTHER): Payer: Medicare Other | Admitting: *Deleted

## 2018-06-04 ENCOUNTER — Other Ambulatory Visit: Payer: Self-pay

## 2018-06-04 DIAGNOSIS — I255 Ischemic cardiomyopathy: Secondary | ICD-10-CM

## 2018-06-04 DIAGNOSIS — I5022 Chronic systolic (congestive) heart failure: Secondary | ICD-10-CM

## 2018-06-05 LAB — CUP PACEART REMOTE DEVICE CHECK
Battery Remaining Longevity: 110 mo
Battery Voltage: 3.01 V
Brady Statistic RV Percent Paced: 0.01 %
Date Time Interrogation Session: 20200316041703
HighPow Impedance: 93 Ohm
Implantable Lead Implant Date: 20160919
Implantable Lead Location: 753860
Implantable Lead Model: 6935
Implantable Pulse Generator Implant Date: 20160919
Lead Channel Impedance Value: 456 Ohm
Lead Channel Impedance Value: 532 Ohm
Lead Channel Pacing Threshold Amplitude: 1 V
Lead Channel Pacing Threshold Pulse Width: 0.4 ms
Lead Channel Sensing Intrinsic Amplitude: 14.25 mV
Lead Channel Sensing Intrinsic Amplitude: 14.25 mV
Lead Channel Setting Pacing Amplitude: 2 V
Lead Channel Setting Pacing Pulse Width: 0.4 ms
Lead Channel Setting Sensing Sensitivity: 0.3 mV

## 2018-06-11 NOTE — Progress Notes (Signed)
Remote ICD transmission.   

## 2018-09-03 ENCOUNTER — Ambulatory Visit (INDEPENDENT_AMBULATORY_CARE_PROVIDER_SITE_OTHER): Payer: Medicare Other | Admitting: *Deleted

## 2018-09-03 DIAGNOSIS — I255 Ischemic cardiomyopathy: Secondary | ICD-10-CM

## 2018-09-03 DIAGNOSIS — I5022 Chronic systolic (congestive) heart failure: Secondary | ICD-10-CM

## 2018-09-03 LAB — CUP PACEART REMOTE DEVICE CHECK
Battery Remaining Longevity: 106 mo
Battery Voltage: 3.01 V
Brady Statistic RV Percent Paced: 0.01 %
Date Time Interrogation Session: 20200615052203
HighPow Impedance: 90 Ohm
Implantable Lead Implant Date: 20160919
Implantable Lead Location: 753860
Implantable Lead Model: 6935
Implantable Pulse Generator Implant Date: 20160919
Lead Channel Impedance Value: 418 Ohm
Lead Channel Impedance Value: 532 Ohm
Lead Channel Pacing Threshold Amplitude: 0.875 V
Lead Channel Pacing Threshold Pulse Width: 0.4 ms
Lead Channel Sensing Intrinsic Amplitude: 13.5 mV
Lead Channel Sensing Intrinsic Amplitude: 13.5 mV
Lead Channel Setting Pacing Amplitude: 2 V
Lead Channel Setting Pacing Pulse Width: 0.4 ms
Lead Channel Setting Sensing Sensitivity: 0.3 mV

## 2018-09-10 ENCOUNTER — Encounter: Payer: Self-pay | Admitting: Cardiology

## 2018-09-10 NOTE — Progress Notes (Signed)
Remote ICD transmission.   

## 2018-12-03 ENCOUNTER — Ambulatory Visit (INDEPENDENT_AMBULATORY_CARE_PROVIDER_SITE_OTHER): Payer: Medicare Other | Admitting: *Deleted

## 2018-12-03 DIAGNOSIS — I255 Ischemic cardiomyopathy: Secondary | ICD-10-CM | POA: Diagnosis not present

## 2018-12-03 DIAGNOSIS — I5022 Chronic systolic (congestive) heart failure: Secondary | ICD-10-CM

## 2018-12-03 LAB — CUP PACEART REMOTE DEVICE CHECK
Battery Remaining Longevity: 102 mo
Battery Voltage: 3.01 V
Brady Statistic RV Percent Paced: 0.01 %
Date Time Interrogation Session: 20200914041603
HighPow Impedance: 90 Ohm
Implantable Lead Implant Date: 20160919
Implantable Lead Location: 753860
Implantable Lead Model: 6935
Implantable Pulse Generator Implant Date: 20160919
Lead Channel Impedance Value: 418 Ohm
Lead Channel Impedance Value: 532 Ohm
Lead Channel Pacing Threshold Amplitude: 0.875 V
Lead Channel Pacing Threshold Pulse Width: 0.4 ms
Lead Channel Sensing Intrinsic Amplitude: 15.625 mV
Lead Channel Sensing Intrinsic Amplitude: 15.625 mV
Lead Channel Setting Pacing Amplitude: 2 V
Lead Channel Setting Pacing Pulse Width: 0.4 ms
Lead Channel Setting Sensing Sensitivity: 0.3 mV

## 2018-12-14 NOTE — Progress Notes (Signed)
Remote ICD transmission.   

## 2019-01-17 ENCOUNTER — Other Ambulatory Visit: Payer: Self-pay | Admitting: Cardiovascular Disease

## 2019-02-18 ENCOUNTER — Other Ambulatory Visit: Payer: Self-pay

## 2019-02-18 ENCOUNTER — Ambulatory Visit: Payer: Medicare Other | Admitting: Cardiovascular Disease

## 2019-02-18 ENCOUNTER — Encounter: Payer: Self-pay | Admitting: Internal Medicine

## 2019-02-18 ENCOUNTER — Encounter: Payer: Self-pay | Admitting: Cardiovascular Disease

## 2019-02-18 ENCOUNTER — Encounter: Payer: Self-pay | Admitting: Nurse Practitioner

## 2019-02-18 ENCOUNTER — Ambulatory Visit (INDEPENDENT_AMBULATORY_CARE_PROVIDER_SITE_OTHER): Payer: Medicare Other | Admitting: Internal Medicine

## 2019-02-18 VITALS — BP 126/68 | HR 66 | Ht 62.0 in | Wt 247.8 lb

## 2019-02-18 VITALS — BP 126/68 | HR 66 | Ht 62.0 in | Wt 247.0 lb

## 2019-02-18 DIAGNOSIS — I255 Ischemic cardiomyopathy: Secondary | ICD-10-CM

## 2019-02-18 DIAGNOSIS — I5022 Chronic systolic (congestive) heart failure: Secondary | ICD-10-CM | POA: Diagnosis not present

## 2019-02-18 DIAGNOSIS — Z95810 Presence of automatic (implantable) cardiac defibrillator: Secondary | ICD-10-CM | POA: Diagnosis not present

## 2019-02-18 NOTE — Patient Instructions (Signed)
Medication Instructions:  Your physician recommends that you continue on your current medications as directed. Please refer to the Current Medication list given to you today.  Labwork: None ordered.  Testing/Procedures: None ordered.  Follow-Up: Your physician wants you to follow-up in: one year with Dr. Lovena Le.   You will receive a reminder letter in the mail two months in advance. If you don't receive a letter, please call our office to schedule the follow-up appointment.  Remote monitoring is used to monitor your ICD from home. This monitoring reduces the number of office visits required to check your device to one time per year. It allows [place] to keep an eye on the functioning of your device to ensure it is working properly. You are scheduled for a device check from home on 03/04/2019. You may send your transmission at any time that day. If you have a wireless device, the transmission will be sent automatically. After your physician reviews your transmission, you will receive a postcard with your next transmission date.  Any Other Special Instructions Will Be Listed Below (If Applicable).  If you need a refill on your cardiac medications before your next appointment, please call your pharmacy.

## 2019-02-18 NOTE — Progress Notes (Signed)
Cardiology Office Note   Date:  02/18/2019   ID:  Nancy BurkeLinda Y Huber, DOB 04/06/1950, MRN [id number]  PCP:  Deloris Pingyter-Brown, Sherry M, MD  Cardiologist:   Kristeen MissPhilip , MD   Chief Complaint  Patient presents with  . Congestive Heart Failure   1. Coronary artery disease-status post chronic LAD occlusion with a large anterior wall myocardial infarction 2. Congestive heart failure-EF of 30-40% 3. AICD placement 4. Hyperlipidemia 5 obesity  History of Present Illness:  [name] is doing very well from a cardiac standpoint. She's not had any episodes of chest pain or shortness of breath. She is able to do all of her normal activities without any significant problems. She has not had much success in losing weight  She denies any syncope or presyncope.  September 14, 2012:  [name] is doing well. She's not having any episodes of chest pain or shortness of breath. Her ICD has been interrogated recently and was found to be working properly. She has no CP or dyspnea when she does her regular activities. She does not get any regular exercise.   June 13, 2013:  [name] is doing ok. She is exercising some. Walks about 20 minutes a day - several days a week. No CP or dyspnea.    Feb. 25, 2016:  Nancy BurkeLinda Y Huber is a 68 y.o. female who presents for follow up of her CAD and CHF.   Her ICD is working well.  She will need to have the battery checked more often recently because  it is nearing ERI.  She is not getting exercise.   Denies any chest pain  Or dyspnea.   Needs to lose weight.  [facility]   November 12, 2014:  Patient is doing well. No cp or dyspnea. Perhaps getting some exercise  Sept. 1, 2017:  Is doing well.  Followed for CHF and ICD placement  Has hx of chronic systlic CHF Breathing is good.  No CP   Oct. 2, 2018:    [name] is seen today for follow up ov her CAD, CHF and ICD placement .    Wt = 243 lbs. ( up 4 lbs since last years visit )   January 11, 2018:   [name] is seen today for follow-up of her coronary artery disease, congestive heart failure and ICD placement.  Her weight today is 239 pounds. Still eating salty foods   - hot dogs once a week , bacon once month,  Canned veggies weekly .    Nov. 30, 2020:  Has tried to cut out her salt .  Wt is 247 lbs ( up 8 lbs from last year ) No exercise . Especially with covid    Past Medical History:  Diagnosis Date  . AICD (automatic cardioverter/defibrillator) present 12/08/2014  . CAD (coronary artery disease)    CHRONICALLY OCCLUDED LAD WITH A LARGE ANTERIOR WALL MI   . CHF (congestive heart failure) (HCC)    EF 30-40%  . Dyslipidemia   . Hypertension   . ICD (implantable cardiac defibrillator), single, in situ    MEDTRONIC  . Ischemic cardiomyopathy   . Myocardial infarction [facility](HCC)    ANTERIOR WALL    Past Surgical History:  Procedure Laterality Date  . CARDIAC CATHETERIZATION  01/13/05, 06/29/99  . CARDIAC DEFIBRILLATOR PLACEMENT  02/21/05   SINGLE/MEDTRONIC  . EP IMPLANTABLE DEVICE N/A 11/14/2014   Procedure:  ICD Generator Changeout with new lead & capping old [phone]  lead;  Surgeon: Marinus Maw, MD;  Location: [facility] INVASIVE CV LAB;  Service: Cardiovascular;  Laterality: N/A;  . EP IMPLANTABLE DEVICE N/A 12/08/2014   Procedure: ICD Implant;  Surgeon: Marinus Maw, MD;  Location: [facility] INVASIVE CV LAB;  Service: Cardiovascular;  Laterality: N/A;     Current Outpatient Medications  Medication Sig Dispense Refill  . aspirin EC 81 MG tablet Take 81 mg by mouth daily.    . carvedilol (COREG) 25 MG tablet TAKE 1 & 1/2 (ONE & ONE-HALF) TABLETS BY MOUTH TWICE DAILY 270 tablet 3  . cetirizine (ZYRTEC) 10 MG tablet Take 10 mg by mouth daily as needed for allergies.    . Cholecalciferol (VITAMIN D) 2000 UNITS CAPS Take 1 capsule (2,000 Units total) by mouth daily. 30 capsule   . citalopram (CELEXA) 40 MG tablet Take 40 mg by mouth daily.     . fexofenadine (ALLEGRA) 180 MG  tablet Take 180 mg by mouth daily as needed for allergies or rhinitis.    . furosemide (LASIX) 40 MG tablet Take 1 tablet (40 mg total) by mouth daily. 90 tablet 3  . lisinopril (PRINIVIL,ZESTRIL) 20 MG tablet Take 1 tablet (20 mg total) by mouth daily. 90 tablet 3  . montelukast (SINGULAIR) 10 MG tablet Take 10 mg by mouth as needed.   0  . potassium chloride (K-DUR,KLOR-CON) 10 MEQ tablet Take 1 tablet (10 mEq total) by mouth daily. 90 tablet 3  . pravastatin (PRAVACHOL) 20 MG tablet Take 1 tablet by mouth once daily 90 tablet 0  . vitamin B-12 (CYANOCOBALAMIN) 100 MCG tablet Take 1 tablet by mouth daily.     No current facility-administered medications for this visit.     Allergies:   Codeine, Crestor [rosuvastatin], Doxycycline, Ecotrin [aspirin], Lipitor [atorvastatin], Nsaids, Penicillins, Sulfa drugs cross reactors, Vitamin c, and Zocor [simvastatin]    Social History:  The patient  reports that she has never smoked. She has never used smokeless tobacco. She reports that she does not drink alcohol or use drugs.   Family History:  The patient's family history includes Aneurysm in her brother; Coronary artery disease in her mother; Heart attack (age of onset: 72) in her mother; Hypertension in her father.    ROS:  Please see the history of present illness.     Physical Exam: Blood pressure 126/68, pulse 66, height 5\' 2"  (1.575 m), weight 247 lb 12.8 oz (112.4 kg), SpO2 96 %.  GEN:  Well nourished, well developed in no acute distress HEENT: Normal NECK: No JVD; No carotid bruits LYMPHATICS: No lymphadenopathy CARDIAC: RRR , no murmurs, rubs, gallops RESPIRATORY:  Clear to auscultation without rales, wheezing or rhonchi  ABDOMEN: Soft, non-tender, non-distended MUSCULOSKELETAL:  No edema; No deformity  SKIN: Warm and dry NEUROLOGIC:  Alert and oriented x 3   EKG:   Nov. 30, 2020  NSR at 65.   Low voltage   Recent Labs: No results found for requested labs within last 8760  hours.    Lipid Panel    Component Value Date/Time   CHOL 159 12/20/2016 1039   TRIG 122 12/20/2016 1039   HDL 44 12/20/2016 1039   CHOLHDL 3.6 12/20/2016 1039   CHOLHDL 3 11/10/2014 0926   VLDL 18.6 11/10/2014 0926   LDLCALC 91 12/20/2016 1039      Wt Readings from Last 3 Encounters:  02/18/19 247 lb 12.8 oz (112.4 kg)  01/19/18 239 lb (108.4 kg)  01/11/18 239 lb (108.4 kg)  Other studies Reviewed: Additional studies/ records that were reviewed today include: . Review of the above records demonstrates:    ASSESSMENT AND PLAN:  1. Coronary artery disease-   she denies having any angina.  Continue current medications.   2. Congestive heart failure-EF of 30-40% -    She still eats a relatively unregulated diet.  She is gained a pound since her last visit.  I strongly urged her to work on a better diet and exercise program.  She also needs to make sure that she is restricting salt.  She takes Lasix on a daily basis.  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  This is a 6-week commitment which would require that she hold her Lasix for 6 weeks.  I do not think it would be wise for her to hold her Lasix for that long period of time.  Additionally, having her take it at night would be detrimental to her health as it would keep her up all night with the diuresis associated with Lasix.  We have written a letter stating this for their consideration.   3. AICD placement -   Followed by EP   4. Hyperlipidemia -   managed by her primary medical doctor.    5 obesity -she is gained 8 more pounds since last year.  Have strongly urged her to work on a better diet, exercise, weight loss program.  Current medicines are reviewed at length with the patient today.  The patient does not have concerns regarding medicines.  The following changes have been made:  no change   Disposition:   FU with 1 year   Signed, Mertie Moores, MD  02/18/2019 2:48 PM    [address], [place], [place]  [address] Phone: [phone]; Fax: [phone]

## 2019-02-18 NOTE — Patient Instructions (Signed)
Medication Instructions:  Your physician recommends that you continue on your current medications as directed. Please refer to the Current Medication list given to you today.  *If you need a refill on your cardiac medications before your next appointment, please call your pharmacy*  Lab Work: None Ordered   Testing/Procedures: None Ordered   Follow-Up: At [facility], you and your health needs are our priority.  As part of our continuing mission to provide you with exceptional heart care, we have created designated Provider Care Teams.  These Care Teams include your primary Cardiologist (physician) and Advanced Practice Providers (APPs -  Physician Assistants and Nurse Practitioners) who all work together to provide you with the care you need, when you need it.  Your next appointment:   1 year(s)  The format for your next appointment:   In Person  Provider:   You may see Philip [name], MD or one of the following Advanced Practice Providers on your designated Care Team:    Scott Weaver, PA-C  Vin Bhagat, PA-C  Janine Hammond, NP    

## 2019-02-18 NOTE — Progress Notes (Signed)
HPI [name] returns today for followup. She is a pleasant 68 yo woman with a h/o CAD, s/p MI, chronic systolic heart failure and morbid obesity. The patient has been quiet lately but has not lost weight unfortunately. She denies chest pain or sob but does admit to dietary indiscretion. No chest pain. No ICD therapies. Allergies  Allergen Reactions  . Codeine Nausea And Vomiting  . Crestor [Rosuvastatin] Other (See Comments)    Muscle aches  . Doxycycline Nausea Only  . Ecotrin [Aspirin] Other (See Comments)    BLEEDING ULCER - ONLY IN HIGH DOSES  . Lipitor [Atorvastatin] Other (See Comments)    Muscle aches  . Nsaids Other (See Comments)    BLEEDING ULCER  . Penicillins Rash  . Sulfa Drugs Cross Reactors Nausea And Vomiting  . Vitamin C Other (See Comments)    Upsets stomach  . Zocor [Simvastatin] Other (See Comments)    Muscle aches     Current Outpatient Medications  Medication Sig Dispense Refill  . aspirin EC 81 MG tablet Take 81 mg by mouth daily.    . carvedilol (COREG) 25 MG tablet TAKE 1 & 1/2 (ONE & ONE-HALF) TABLETS BY MOUTH TWICE DAILY 270 tablet 3  . cetirizine (ZYRTEC) 10 MG tablet Take 10 mg by mouth daily as needed for allergies.    . Cholecalciferol (VITAMIN D) 2000 UNITS CAPS Take 1 capsule (2,000 Units total) by mouth daily. 30 capsule   . citalopram (CELEXA) 40 MG tablet Take 40 mg by mouth daily.     . fexofenadine (ALLEGRA) 180 MG tablet Take 180 mg by mouth daily as needed for allergies or rhinitis.    . furosemide (LASIX) 40 MG tablet Take 1 tablet (40 mg total) by mouth daily. 90 tablet 3  . lisinopril (PRINIVIL,ZESTRIL) 20 MG tablet Take 1 tablet (20 mg total) by mouth daily. 90 tablet 3  . montelukast (SINGULAIR) 10 MG tablet Take 10 mg by mouth as needed.   0  . potassium chloride (K-DUR,KLOR-CON) 10 MEQ tablet Take 1 tablet (10 mEq total) by mouth daily. 90 tablet 3  . pravastatin (PRAVACHOL) 20 MG tablet Take 1 tablet by mouth once daily 90  tablet 0  . vitamin B-12 (CYANOCOBALAMIN) 100 MCG tablet Take 1 tablet by mouth daily.     No current facility-administered medications for this visit.      Past Medical History:  Diagnosis Date  . AICD (automatic cardioverter/defibrillator) present 12/08/2014  . CAD (coronary artery disease)    CHRONICALLY OCCLUDED LAD WITH A LARGE ANTERIOR WALL MI   . CHF (congestive heart failure) (HCC)    EF 30-40%  . Dyslipidemia   . Hypertension   . ICD (implantable cardiac defibrillator), single, in situ    MEDTRONIC  . Ischemic cardiomyopathy   . Myocardial infarction ([place])    ANTERIOR WALL    ROS:   All systems reviewed and negative except as noted in the HPI.   Past Surgical History:  Procedure Laterality Date  . CARDIAC CATHETERIZATION  01/13/05, 06/29/99  . CARDIAC DEFIBRILLATOR PLACEMENT  02/21/05   SINGLE/MEDTRONIC  . EP IMPLANTABLE DEVICE N/A 11/14/2014   Procedure:  ICD Generator Changeout with new lead & capping old [phone] lead;  Surgeon: Evans Lance, MD;  Location: [place] CV LAB;  Service: Cardiovascular;  Laterality: N/A;  . EP IMPLANTABLE DEVICE N/A 12/08/2014   Procedure: ICD Implant;  Surgeon: Evans Lance, MD;  Location: [place] CV LAB;  Service: Cardiovascular;  Laterality: N/A;     Family History  Problem Relation Age of Onset  . Heart attack Mother 46  . Coronary artery disease Mother   . Aneurysm Brother   . Hypertension Father      Social History   Socioeconomic History  . Marital status: Married    Spouse name: Not on file  . Number of children: Not on file  . Years of education: Not on file  . Highest education level: Not on file  Occupational History  . Not on file  Social Needs  . Financial resource strain: Not on file  . Food insecurity    Worry: Not on file    Inability: Not on file  . Transportation needs    Medical: Not on file    Non-medical: Not on file  Tobacco Use  . Smoking status: Never Smoker  . Smokeless tobacco:  Never Used  Substance and Sexual Activity  . Alcohol use: No  . Drug use: No  . Sexual activity: Not on file  Lifestyle  . Physical activity    Days per week: Not on file    Minutes per session: Not on file  . Stress: Not on file  Relationships  . Social Musician on phone: Not on file    Gets together: Not on file    Attends religious service: Not on file    Active member of club or organization: Not on file    Attends meetings of clubs or organizations: Not on file    Relationship status: Not on file  . Intimate partner violence    Fear of current or ex partner: Not on file    Emotionally abused: Not on file    Physically abused: Not on file    Forced sexual activity: Not on file  Other Topics Concern  . Not on file  Social History Narrative  . Not on file     BP 126/68   Pulse 66   Ht 5\' 2"  (1.575 m)   Wt 247 lb (112 kg)   SpO2 96%   BMI 45.18 kg/m   Physical Exam:  Obese but well appearing NAD HEENT: Unremarkable Neck:  6 cm JVD, no thyromegally Lymphatics:  No adenopathy Back:  No CVA tenderness Lungs:  Clear with no wheezes HEART:  Regular rate rhythm, no murmurs, no rubs, no clicks Abd:  soft, positive bowel sounds, no organomegally, no rebound, no guarding Ext:  2 plus pulses, no edema, no cyanosis, no clubbing Skin:  No rashes no nodules Neuro:  CN II through XII intact, motor grossly intact  EKG - nsr  DEVICE  Normal device function.  See PaceArt for details.   Assess/Plan: 1. ICM - she has class 2 symptoms. No angina.  2. Chronic systolic heart failure - her symptoms are class 2. She will continue her current meds.  3. ICD - her medtronic single chamber ICD is working normally.  4. Obesity - her weight is up 7 lbs. I asked to make a concerted effort to lose weight.  .D.

## 2019-02-21 ENCOUNTER — Other Ambulatory Visit: Payer: Self-pay | Admitting: Cardiovascular Disease

## 2019-02-28 ENCOUNTER — Other Ambulatory Visit: Payer: Self-pay | Admitting: Cardiovascular Disease

## 2019-03-04 ENCOUNTER — Ambulatory Visit (INDEPENDENT_AMBULATORY_CARE_PROVIDER_SITE_OTHER): Payer: Medicare Other | Admitting: *Deleted

## 2019-03-04 DIAGNOSIS — I5022 Chronic systolic (congestive) heart failure: Secondary | ICD-10-CM

## 2019-03-04 LAB — CUP PACEART REMOTE DEVICE CHECK
Battery Remaining Longevity: 100 mo
Battery Voltage: 3.01 V
Brady Statistic RV Percent Paced: 0.01 %
Date Time Interrogation Session: 20201214043824
HighPow Impedance: 81 Ohm
Implantable Lead Implant Date: 20160919
Implantable Lead Location: 753860
Implantable Lead Model: 6935
Implantable Pulse Generator Implant Date: 20160919
Lead Channel Impedance Value: 456 Ohm
Lead Channel Impedance Value: 513 Ohm
Lead Channel Pacing Threshold Amplitude: 0.875 V
Lead Channel Pacing Threshold Pulse Width: 0.4 ms
Lead Channel Sensing Intrinsic Amplitude: 13.875 mV
Lead Channel Sensing Intrinsic Amplitude: 13.875 mV
Lead Channel Setting Pacing Amplitude: 2 V
Lead Channel Setting Pacing Pulse Width: 0.4 ms
Lead Channel Setting Sensing Sensitivity: 0.3 mV

## 2019-03-12 ENCOUNTER — Other Ambulatory Visit: Payer: Self-pay | Admitting: Cardiovascular Disease

## 2019-04-06 NOTE — Progress Notes (Signed)
ICD remote 

## 2019-04-19 ENCOUNTER — Other Ambulatory Visit: Payer: Self-pay | Admitting: Cardiovascular Disease

## 2019-06-03 ENCOUNTER — Ambulatory Visit (INDEPENDENT_AMBULATORY_CARE_PROVIDER_SITE_OTHER): Payer: Medicare Other | Admitting: *Deleted

## 2019-06-03 DIAGNOSIS — I5022 Chronic systolic (congestive) heart failure: Secondary | ICD-10-CM | POA: Diagnosis not present

## 2019-06-03 LAB — CUP PACEART REMOTE DEVICE CHECK
Battery Remaining Longevity: 97 mo
Battery Voltage: 3.01 V
Brady Statistic RV Percent Paced: 0.01 %
Date Time Interrogation Session: 20210315001802
HighPow Impedance: 89 Ohm
Implantable Lead Implant Date: 20160919
Implantable Lead Location: 753860
Implantable Lead Model: 6935
Implantable Pulse Generator Implant Date: 20160919
Lead Channel Impedance Value: 418 Ohm
Lead Channel Impedance Value: 513 Ohm
Lead Channel Pacing Threshold Amplitude: 0.75 V
Lead Channel Pacing Threshold Pulse Width: 0.4 ms
Lead Channel Sensing Intrinsic Amplitude: 14.625 mV
Lead Channel Sensing Intrinsic Amplitude: 14.625 mV
Lead Channel Setting Pacing Amplitude: 2 V
Lead Channel Setting Pacing Pulse Width: 0.4 ms
Lead Channel Setting Sensing Sensitivity: 0.3 mV

## 2019-06-03 NOTE — Progress Notes (Signed)
ICD Remote  

## 2019-09-02 ENCOUNTER — Ambulatory Visit (INDEPENDENT_AMBULATORY_CARE_PROVIDER_SITE_OTHER): Payer: Medicare Other | Admitting: *Deleted

## 2019-09-02 DIAGNOSIS — I255 Ischemic cardiomyopathy: Secondary | ICD-10-CM | POA: Diagnosis not present

## 2019-09-02 LAB — CUP PACEART REMOTE DEVICE CHECK
Battery Remaining Longevity: 93 mo
Battery Voltage: 3.01 V
Brady Statistic RV Percent Paced: 0.01 %
Date Time Interrogation Session: 20210614043725
HighPow Impedance: 76 Ohm
Implantable Lead Implant Date: 20160919
Implantable Lead Location: 753860
Implantable Lead Model: 6935
Implantable Pulse Generator Implant Date: 20160919
Lead Channel Impedance Value: 418 Ohm
Lead Channel Impedance Value: 513 Ohm
Lead Channel Pacing Threshold Amplitude: 0.75 V
Lead Channel Pacing Threshold Pulse Width: 0.4 ms
Lead Channel Sensing Intrinsic Amplitude: 18.125 mV
Lead Channel Sensing Intrinsic Amplitude: 18.125 mV
Lead Channel Setting Pacing Amplitude: 2 V
Lead Channel Setting Pacing Pulse Width: 0.4 ms
Lead Channel Setting Sensing Sensitivity: 0.3 mV

## 2019-09-03 NOTE — Progress Notes (Signed)
Remote ICD transmission.   

## 2019-12-02 ENCOUNTER — Ambulatory Visit (INDEPENDENT_AMBULATORY_CARE_PROVIDER_SITE_OTHER): Payer: Medicare Other | Admitting: *Deleted

## 2019-12-02 DIAGNOSIS — I255 Ischemic cardiomyopathy: Secondary | ICD-10-CM

## 2019-12-02 LAB — CUP PACEART REMOTE DEVICE CHECK
Battery Remaining Longevity: 89 mo
Battery Voltage: 3.01 V
Brady Statistic RV Percent Paced: 0.01 %
Date Time Interrogation Session: 20210913033423
HighPow Impedance: 89 Ohm
Implantable Lead Implant Date: 20160919
Implantable Lead Location: 753860
Implantable Lead Model: 6935
Implantable Pulse Generator Implant Date: 20160919
Lead Channel Impedance Value: 418 Ohm
Lead Channel Impedance Value: 513 Ohm
Lead Channel Pacing Threshold Amplitude: 0.75 V
Lead Channel Pacing Threshold Pulse Width: 0.4 ms
Lead Channel Sensing Intrinsic Amplitude: 15.125 mV
Lead Channel Sensing Intrinsic Amplitude: 15.125 mV
Lead Channel Setting Pacing Amplitude: 2 V
Lead Channel Setting Pacing Pulse Width: 0.4 ms
Lead Channel Setting Sensing Sensitivity: 0.3 mV

## 2019-12-04 NOTE — Progress Notes (Signed)
Remote ICD transmission.   

## 2020-02-11 ENCOUNTER — Other Ambulatory Visit: Payer: Self-pay | Admitting: Cardiovascular Disease

## 2020-02-26 ENCOUNTER — Other Ambulatory Visit: Payer: Self-pay | Admitting: Cardiovascular Disease

## 2020-03-02 ENCOUNTER — Ambulatory Visit (INDEPENDENT_AMBULATORY_CARE_PROVIDER_SITE_OTHER): Payer: Medicare Other

## 2020-03-02 DIAGNOSIS — I255 Ischemic cardiomyopathy: Secondary | ICD-10-CM | POA: Diagnosis not present

## 2020-03-03 LAB — CUP PACEART REMOTE DEVICE CHECK
Battery Remaining Longevity: 85 mo
Battery Voltage: 3.01 V
Brady Statistic RV Percent Paced: 0.01 %
Date Time Interrogation Session: 20211213033624
HighPow Impedance: 83 Ohm
Implantable Lead Implant Date: 20160919
Implantable Lead Location: 753860
Implantable Lead Model: 6935
Implantable Pulse Generator Implant Date: 20160919
Lead Channel Impedance Value: 418 Ohm
Lead Channel Impedance Value: 532 Ohm
Lead Channel Pacing Threshold Amplitude: 0.625 V
Lead Channel Pacing Threshold Pulse Width: 0.4 ms
Lead Channel Sensing Intrinsic Amplitude: 16 mV
Lead Channel Sensing Intrinsic Amplitude: 16 mV
Lead Channel Setting Pacing Amplitude: 2 V
Lead Channel Setting Pacing Pulse Width: 0.4 ms
Lead Channel Setting Sensing Sensitivity: 0.3 mV

## 2020-03-16 NOTE — Progress Notes (Signed)
Remote ICD transmission.   

## 2020-03-17 ENCOUNTER — Other Ambulatory Visit: Payer: Self-pay | Admitting: Cardiovascular Disease

## 2020-03-22 ENCOUNTER — Encounter: Payer: Self-pay | Admitting: Cardiovascular Disease

## 2020-03-22 NOTE — Progress Notes (Signed)
Cardiology Office Note   Date:  03/24/2020   ID:  [name], DOB Feb 07, 1951, MRN [id number]  PCP:  Deloris Ping, MD  Cardiologist:   Kristeen Miss, MD   Chief Complaint  Patient presents with  . Congestive Heart Failure  . Coronary Artery Disease   1. Coronary artery disease-status post chronic LAD occlusion with a large anterior wall myocardial infarction 2. Congestive heart failure-EF of 30-40% 3. AICD placement 4. Hyperlipidemia 5 obesity  Previous notes:   [name] is doing very well from a cardiac standpoint. She's not had any episodes of chest pain or shortness of breath. She is able to do all of her normal activities without any significant problems. She has not had much success in losing weight  She denies any syncope or presyncope.  September 14, 2012:  [name] is doing well. She's not having any episodes of chest pain or shortness of breath. Her ICD has been interrogated recently and was found to be working properly. She has no CP or dyspnea when she does her regular activities. She does not get any regular exercise.   June 13, 2013:  [name] is doing ok. She is exercising some. Walks about 20 minutes a day - several days a week. No CP or dyspnea.    Feb. 25, 2016:  [name] is a 70 y.o. female who presents for follow up of her CAD and CHF.   Her ICD is working well.  She will need to have the battery checked more often recently because  it is nearing ERI.  She is not getting exercise.   Denies any chest pain  Or dyspnea.   Needs to lose weight.  [facility]   November 12, 2014:  Patient is doing well. No cp or dyspnea. Perhaps getting some exercise  Sept. 1, 2017:  Is doing well.  Followed for CHF and ICD placement  Has hx of chronic systlic CHF Breathing is good.  No CP   Oct. 2, 2018:    [name] is seen today for follow up ov her CAD, CHF and ICD placement .    Wt = 243 lbs. ( up 4 lbs since last years visit )    January 11, 2018:   [name] is seen today for follow-up of her coronary artery disease, congestive heart failure and ICD placement.  Her weight today is 239 pounds. Still eating salty foods   - hot dogs once a week , bacon once month,  Canned veggies weekly .    Nov. 30, 2020:  Has tried to cut out her salt .  Wt is 247 lbs ( up 8 lbs from last year ) No exercise . Especially with covid   Jan. 4, 2022: [name] is seen today for follow up of her CAD, CHF.  Has an AICD  She was seen by Dr. Ladona Ridgel today  Wt. Is 255 lbs. 9 up 8 lbs from last year)  No cp, no dyspnea  Exercised during the summer.  None recently .   Going to start with a program  at church .  Is on Lisinopril - we discussed Entresto  We discussed the washout of lisinopril that is needed.    Past Medical History:  Diagnosis Date  . AICD (automatic cardioverter/defibrillator) present 12/08/2014  . CAD (coronary artery disease)    CHRONICALLY OCCLUDED LAD WITH A LARGE ANTERIOR WALL MI   . CHF (congestive heart failure) (HCC)  EF 30-40%  . Dyslipidemia   . Hypertension   . ICD (implantable cardiac defibrillator), single, in situ    MEDTRONIC  . Ischemic cardiomyopathy   . Myocardial infarction [facility])    ANTERIOR WALL    Past Surgical History:  Procedure Laterality Date  . CARDIAC CATHETERIZATION  01/13/05, 06/29/99  . CARDIAC DEFIBRILLATOR PLACEMENT  02/21/05   SINGLE/MEDTRONIC  . EP IMPLANTABLE DEVICE N/A 11/14/2014   Procedure:  ICD Generator Changeout with new lead & capping old [phone] lead;  Surgeon: Evans Lance, MD;  Location: [place] CV LAB;  Service: Cardiovascular;  Laterality: N/A;  . EP IMPLANTABLE DEVICE N/A 12/08/2014   Procedure: ICD Implant;  Surgeon: Evans Lance, MD;  Location: [place] CV LAB;  Service: Cardiovascular;  Laterality: N/A;     Current Outpatient Medications  Medication Sig Dispense Refill  . aspirin EC 81 MG tablet Take 81 mg by mouth daily.    . carvedilol (COREG) 25  MG tablet TAKE 1 & 1/2 (ONE & ONE-HALF) TABLETS BY MOUTH TWICE DAILY 270 tablet 0  . cetirizine (ZYRTEC) 10 MG tablet Take 10 mg by mouth daily as needed for allergies.    . Cholecalciferol (VITAMIN D) 2000 UNITS CAPS Take 1 capsule (2,000 Units total) by mouth daily. 30 capsule   . citalopram (CELEXA) 40 MG tablet Take 40 mg by mouth daily.     . fexofenadine (ALLEGRA) 180 MG tablet Take 180 mg by mouth daily as needed for allergies or rhinitis.    . furosemide (LASIX) 40 MG tablet Take 1 tablet by mouth once daily 90 tablet 0  . lisinopril (ZESTRIL) 20 MG tablet Take 1 tablet by mouth once daily 90 tablet 0  . mirtazapine (REMERON) 15 MG tablet Take 1 tablet by mouth at bedtime.    . montelukast (SINGULAIR) 10 MG tablet Take 10 mg by mouth as needed.   0  . potassium chloride (KLOR-CON) 10 MEQ tablet Take 1 tablet by mouth once daily 90 tablet 0  . pravastatin (PRAVACHOL) 20 MG tablet Take 1 tablet by mouth once daily 90 tablet 3  . vitamin B-12 (CYANOCOBALAMIN) 100 MCG tablet Take 1 tablet by mouth daily.     No current facility-administered medications for this visit.    Allergies:   Codeine, Crestor [rosuvastatin], Doxycycline, Ecotrin [aspirin], Lipitor [atorvastatin], Nsaids, Penicillins, Sulfa drugs cross reactors, Vitamin c, and Zocor [simvastatin]    Social History:  The patient  reports that she has never smoked. She has never used smokeless tobacco. She reports that she does not drink alcohol and does not use drugs.   Family History:  The patient's family history includes Aneurysm in her brother; Coronary artery disease in her mother; Heart attack (age of onset: 16) in her mother; Hypertension in her father.    ROS:  Please see the history of present illness.    Physical Exam: Blood pressure 138/70, pulse 79, height 5\' 2"  (1.575 m), weight 255 lb (115.7 kg), SpO2 98 %.  GEN:  Moderately obese female,  NAD  HEENT: Normal NECK: No JVD; No carotid bruits LYMPHATICS: No  lymphadenopathy CARDIAC: RRR   RESPIRATORY:  Clear to auscultation without rales, wheezing or rhonchi  ABDOMEN: Soft, non-tender, non-distended MUSCULOSKELETAL:  No edema; No deformity  SKIN: Warm and dry NEUROLOGIC:  Alert and oriented x 3    EKG: March 24, 2020: Normal sinus rhythm at 69.  Reduced voltage secondary to her obesity.  No changes from previous EKG.  Recent Labs: No results found for requested labs within last 8760 hours.    Lipid Panel    Component Value Date/Time   CHOL 159 12/20/2016 1039   TRIG 122 12/20/2016 1039   HDL 44 12/20/2016 1039   CHOLHDL 3.6 12/20/2016 1039   CHOLHDL 3 11/10/2014 0926   VLDL 18.6 11/10/2014 0926   LDLCALC 91 12/20/2016 1039      Wt Readings from Last 3 Encounters:  03/24/20 255 lb (115.7 kg)  03/24/20 255 lb 9.6 oz (115.9 kg)  02/18/19 247 lb (112 kg)      Other studies Reviewed: Additional studies/ records that were reviewed today include: . Review of the above records demonstrates:    ASSESSMENT AND PLAN:  1. Coronary artery disease-   No angina .   Cont meds    2. Congestive heart failure-EF of 30-40% -    She would do well on Entresto.  We will discontinue her lisinopril today.  On Thursday she is to start Entresto 49-51 mg twice a day.  We will have her follow-up with an APP in 1 month for an office visit and basic metabolic profile.    3. AICD placement -   Followed by EP , she was seen by Dr. Ladona Ridgel today.  4. Hyperlipidemia -   this is been managed by Dr. Manson Passey in [place].  5 obesity -I have continue to encourage her to work on weight loss program.  Current medicines are reviewed at length with the patient today.  The patient does not have concerns regarding medicines.  The following changes have been made:  no change   Disposition:       Signed, Kristeen Miss, MD  03/24/2020 3:55 PM    [facility] [address] [place], [place], [place]  [address] Phone: [phone]; Fax:  [phone]

## 2020-03-24 ENCOUNTER — Other Ambulatory Visit: Payer: Self-pay

## 2020-03-24 ENCOUNTER — Ambulatory Visit: Payer: Medicare Other | Admitting: Cardiovascular Disease

## 2020-03-24 ENCOUNTER — Encounter: Payer: Self-pay | Admitting: Internal Medicine

## 2020-03-24 ENCOUNTER — Ambulatory Visit (INDEPENDENT_AMBULATORY_CARE_PROVIDER_SITE_OTHER): Payer: Medicare Other | Admitting: Internal Medicine

## 2020-03-24 VITALS — BP 138/70 | HR 69 | Ht 62.0 in | Wt 255.6 lb

## 2020-03-24 VITALS — BP 138/70 | HR 79 | Ht 62.0 in | Wt 255.0 lb

## 2020-03-24 DIAGNOSIS — Z95810 Presence of automatic (implantable) cardiac defibrillator: Secondary | ICD-10-CM

## 2020-03-24 DIAGNOSIS — I2583 Coronary atherosclerosis due to lipid rich plaque: Secondary | ICD-10-CM | POA: Diagnosis not present

## 2020-03-24 DIAGNOSIS — I2510 Atherosclerotic heart disease of native coronary artery without angina pectoris: Secondary | ICD-10-CM | POA: Diagnosis not present

## 2020-03-24 DIAGNOSIS — I255 Ischemic cardiomyopathy: Secondary | ICD-10-CM | POA: Diagnosis not present

## 2020-03-24 DIAGNOSIS — I5022 Chronic systolic (congestive) heart failure: Secondary | ICD-10-CM | POA: Diagnosis not present

## 2020-03-24 MED ORDER — ENTRESTO 49-51 MG PO TABS: 1.0000 | ORAL_TABLET | Freq: Two times a day (BID) | ORAL | 0 refills | Status: DC

## 2020-03-24 NOTE — Progress Notes (Signed)
HPI [name] returns today for followup. She is a pleasant 69 yo woman with a h/o CAD, s/p MI, chronic systolic heart failure and morbid obesity. The patient has been quiet lately but has not lost weight unfortunately she has gained. She denies chest pain or sob but does admit to dietary indiscretion. No chest pain. No ICD therapies. Allergies  Allergen Reactions  . Codeine Nausea And Vomiting  . Crestor [Rosuvastatin] Other (See Comments)    Muscle aches  . Doxycycline Nausea Only  . Ecotrin [Aspirin] Other (See Comments)    BLEEDING ULCER - ONLY IN HIGH DOSES  . Lipitor [Atorvastatin] Other (See Comments)    Muscle aches  . Nsaids Other (See Comments)    BLEEDING ULCER  . Penicillins Rash  . Sulfa Drugs Cross Reactors Nausea And Vomiting  . Vitamin C Other (See Comments)    Upsets stomach  . Zocor [Simvastatin] Other (See Comments)    Muscle aches     Current Outpatient Medications  Medication Sig Dispense Refill  . aspirin EC 81 MG tablet Take 81 mg by mouth daily.    . carvedilol (COREG) 25 MG tablet TAKE 1 & 1/2 (ONE & ONE-HALF) TABLETS BY MOUTH TWICE DAILY 270 tablet 0  . cetirizine (ZYRTEC) 10 MG tablet Take 10 mg by mouth daily as needed for allergies.    . Cholecalciferol (VITAMIN D) 2000 UNITS CAPS Take 1 capsule (2,000 Units total) by mouth daily. 30 capsule   . citalopram (CELEXA) 40 MG tablet Take 40 mg by mouth daily.     . fexofenadine (ALLEGRA) 180 MG tablet Take 180 mg by mouth daily as needed for allergies or rhinitis.    . furosemide (LASIX) 40 MG tablet Take 1 tablet by mouth once daily 90 tablet 0  . lisinopril (ZESTRIL) 20 MG tablet Take 1 tablet by mouth once daily 90 tablet 0  . mirtazapine (REMERON) 15 MG tablet Take 1 tablet by mouth at bedtime.    . montelukast (SINGULAIR) 10 MG tablet Take 10 mg by mouth as needed.   0  . potassium chloride (KLOR-CON) 10 MEQ tablet Take 1 tablet by mouth once daily 90 tablet 0  . pravastatin (PRAVACHOL) 20  MG tablet Take 1 tablet by mouth once daily 90 tablet 3  . vitamin B-12 (CYANOCOBALAMIN) 100 MCG tablet Take 1 tablet by mouth daily.     No current facility-administered medications for this visit.     Past Medical History:  Diagnosis Date  . AICD (automatic cardioverter/defibrillator) present 12/08/2014  . CAD (coronary artery disease)    CHRONICALLY OCCLUDED LAD WITH A LARGE ANTERIOR WALL MI   . CHF (congestive heart failure) (HCC)    EF 30-40%  . Dyslipidemia   . Hypertension   . ICD (implantable cardiac defibrillator), single, in situ    [employer]TRONIC  . Ischemic cardiomyopathy   . Myocardial infarction (HCC)    ANTERIOR WALL    ROS:   All systems reviewed and negative except as noted in the HPI.   Past Surgical History:  Procedure Laterality Date  . CARDIAC CATHETERIZATION  01/13/05, 06/29/99  . CARDIAC DEFIBRILLATOR PLACEMENT  02/21/05   SINGLE/MEDTRONIC  . EP IMPLANTABLE DEVICE N/A 11/14/2014   Procedure:  ICD Generator Changeout with new lead & capping old [phone] lead;  Surgeon: Marinus Maw, MD;  Location: [facility] INVASIVE CV LAB;  Service: Cardiovascular;  Laterality: N/A;  . EP IMPLANTABLE DEVICE N/A 12/08/2014   Procedure: ICD Implant;  Surgeon: Marinus Maw, MD;  Location: [facility] INVASIVE CV LAB;  Service: Cardiovascular;  Laterality: N/A;     Family History  Problem Relation Age of Onset  . Heart attack Mother 23  . Coronary artery disease Mother   . Aneurysm Brother   . Hypertension Father      Social History   Socioeconomic History  . Marital status: Married    Spouse name: Not on file  . Number of children: Not on file  . Years of education: Not on file  . Highest education level: Not on file  Occupational History  . Not on file  Tobacco Use  . Smoking status: Never Smoker  . Smokeless tobacco: Never Used  Substance and Sexual Activity  . Alcohol use: No  . Drug use: No  . Sexual activity: Not on file  Other Topics Concern  . Not on file  Social  History Narrative  . Not on file   Social Determinants of Health   Financial Resource Strain: Not on file  Food Insecurity: Not on file  Transportation Needs: Not on file  Physical Activity: Not on file  Stress: Not on file  Social Connections: Not on file  Intimate Partner Violence: Not on file     BP 138/70   Pulse 69   Ht 5\' 2"  (1.575 m)   Wt 255 lb 9.6 oz (115.9 kg)   SpO2 90%   BMI 46.75 kg/m   Physical Exam:  Well appearing NAD HEENT: Unremarkable Neck:  No JVD, no thyromegally Lymphatics:  No adenopathy Back:  No CVA tenderness Lungs:  Clear with no wheezes HEART:  Regular rate rhythm, no murmurs, no rubs, no clicks Abd:  soft, positive bowel sounds, no organomegally, no rebound, no guarding Ext:  2 plus pulses, no edema, no cyanosis, no clubbing Skin:  No rashes no nodules Neuro:  CN II through XII intact, motor grossly intact  EKG - nsr with poor R wave progression  DEVICE  Normal device function.  See PaceArt for details.   Assess/Plan: 1. Chronic systolic heart failure -her symptoms remain class 2.  2. Obesity - I strongly encouraged weight loss and discussed intermittent fasting. 3. CAD - she denies anginal symptoms.  4. ICD - her medtronic ICD is working normally. No sustained arrhythmias.  Gil Ingwersen,MD

## 2020-03-24 NOTE — Patient Instructions (Addendum)
Medication Instructions:  1) STOP LISINOPRIL 2) START ENTRESTO 49-51 mg twice daily on Thursday 03/26/20 *If you need a refill on your cardiac medications before your next appointment, please call your pharmacy*  Follow-Up: You are scheduled for your follow-up appointment with Norma Fredrickson on 04/21/20 at 10:45AM. You will have labs drawn - you do not need to be fasting.

## 2020-03-24 NOTE — Patient Instructions (Signed)
Medication Instructions:  Your physician recommends that you continue on your current medications as directed. Please refer to the Current Medication list given to you today.  Labwork: None ordered.  Testing/Procedures: None ordered.  Follow-Up: Your physician wants you to follow-up in: one year with Dr. Ladona Ridgel.   You will receive a reminder letter in the mail two months in advance. If you don't receive a letter, please call our office to schedule the follow-up appointment.  Remote monitoring is used to monitor your ICD from home. This monitoring reduces the number of office visits required to check your device to one time per year. It allows [place] to keep an eye on the functioning of your device to ensure it is working properly. You are scheduled for a device check from home on 06/01/2020. You may send your transmission at any time that day. If you have a wireless device, the transmission will be sent automatically. After your physician reviews your transmission, you will receive a postcard with your next transmission date.  Any Other Special Instructions Will Be Listed Below (If Applicable).  If you need a refill on your cardiac medications before your next appointment, please call your pharmacy.

## 2020-04-08 NOTE — Progress Notes (Signed)
CARDIOLOGY OFFICE NOTE  Date:  04/21/2020    [name] Date of Birth: 1951-02-05 Medical Record #[id number]  PCP:  Deloris Ping, MD  Cardiologist:  [name]   Chief Complaint  Patient presents with  . Follow-up    Seen for Dr. Elease Hashimoto    History of Present Illness: [name] is a 70 y.o. female who presents today for a one month check. Seen for Dr. Elease Hashimoto.   She has a history of CAD - prior large anterior MI with chronic LAD occlusion, EF of 30 to 40% with chronic systolic HF, has underlying ICD, HLD and obesity.   Seen a month ago by Dr. Elease Hashimoto. Entresto started.   Comes in today. Here alone. Doing ok. Trying to work on her weight - Dr. Ladona Ridgel suggested intermittent fasting - she is doing this - has lost 10 pounds so far. She eats between 12 noon and 8 pm. Also making better choices overall. She feels good. No chest pain. Breathing is good. She is on low dose potassium - will recheck BMET today.   Past Medical History:  Diagnosis Date  . AICD (automatic cardioverter/defibrillator) present 12/08/2014  . CAD (coronary artery disease)    CHRONICALLY OCCLUDED LAD WITH A LARGE ANTERIOR WALL MI   . CHF (congestive heart failure) (HCC)    EF 30-40%  . Dyslipidemia   . Hypertension   . ICD (implantable cardiac defibrillator), single, in situ    [employer]TRONIC  . Ischemic cardiomyopathy   . Myocardial infarction [facility])    ANTERIOR WALL    Past Surgical History:  Procedure Laterality Date  . CARDIAC CATHETERIZATION  01/13/05, 06/29/99  . CARDIAC DEFIBRILLATOR PLACEMENT  02/21/05   SINGLE/MEDTRONIC  . EP IMPLANTABLE DEVICE N/A 11/14/2014   Procedure:  ICD Generator Changeout with new lead & capping old [id number] lead;  Surgeon: Marinus Maw, MD;  Location: [facility] INVASIVE CV LAB;  Service: Cardiovascular;  Laterality: N/A;  . EP IMPLANTABLE DEVICE N/A 12/08/2014   Procedure: ICD Implant;  Surgeon: Marinus Maw, MD;  Location: [facility] INVASIVE CV LAB;  Service: Cardiovascular;   Laterality: N/A;     Medications: Current Meds  Medication Sig  . aspirin EC 81 MG tablet Take 81 mg by mouth daily.  . carvedilol (COREG) 25 MG tablet TAKE 1 & 1/2 (ONE & ONE-HALF) TABLETS BY MOUTH TWICE DAILY  . cetirizine (ZYRTEC) 10 MG tablet Take 10 mg by mouth daily as needed for allergies.  . Cholecalciferol (VITAMIN D) 2000 UNITS CAPS Take 1 capsule (2,000 Units total) by mouth daily.  . citalopram (CELEXA) 40 MG tablet Take 40 mg by mouth daily.   . fexofenadine (ALLEGRA) 180 MG tablet Take 180 mg by mouth daily as needed for allergies or rhinitis.  . furosemide (LASIX) 40 MG tablet Take 1 tablet by mouth once daily  . mirtazapine (REMERON) 15 MG tablet Take 1 tablet by mouth at bedtime.  . montelukast (SINGULAIR) 10 MG tablet Take 10 mg by mouth as needed.   . potassium chloride (KLOR-CON) 10 MEQ tablet Take 1 tablet by mouth once daily  . pravastatin (PRAVACHOL) 20 MG tablet Take 1 tablet by mouth once daily  . sacubitril-valsartan (ENTRESTO) 49-51 MG Take 1 tablet by mouth 2 (two) times daily.  . vitamin B-12 (CYANOCOBALAMIN) 100 MCG tablet Take 1 tablet by mouth daily.     Allergies: Allergies  Allergen Reactions  . Codeine Nausea And Vomiting  . Crestor [Rosuvastatin] Other (See Comments)  Muscle aches  . Doxycycline Nausea Only  . Ecotrin [Aspirin] Other (See Comments)    BLEEDING ULCER - ONLY IN HIGH DOSES  . Lipitor [Atorvastatin] Other (See Comments)    Muscle aches  . Nsaids Other (See Comments)    BLEEDING ULCER  . Penicillins Rash  . Sulfa Drugs Cross Reactors Nausea And Vomiting  . Vitamin C Other (See Comments)    Upsets stomach  . Zocor [Simvastatin] Other (See Comments)    Muscle aches    Social History: The patient  reports that she has never smoked. She has never used smokeless tobacco. She reports that she does not drink alcohol and does not use drugs.   Family History: The patient's family history includes Aneurysm in her brother;  Coronary artery disease in her mother; Heart attack (age of onset: 65) in her mother; Hypertension in her father.   Review of Systems: Please see the history of present illness.   All other systems are reviewed and negative.   Physical Exam: VS:  BP 124/60   Pulse 80   Ht 5\' 2"  (1.575 m)   Wt 244 lb (110.7 kg)   SpO2 97%   BMI 44.63 kg/m  .  BMI Body mass index is 44.63 kg/m.  Wt Readings from Last 3 Encounters:  04/21/20 244 lb (110.7 kg)  03/24/20 255 lb (115.7 kg)  03/24/20 255 lb 9.6 oz (115.9 kg)    General: Pleasant. Alert and in no acute distress. Her weight is down 11 pounds.   Cardiac: Regular rate and rhythm. No murmurs, rubs, or gallops. No edema.  Respiratory:  Lungs are clear to auscultation bilaterally with normal work of breathing.  GI: Soft and nontender.  MS: No deformity or atrophy. Gait and ROM intact.  Skin: Warm and dry. Color is normal.  Neuro:  Strength and sensation are intact and no gross focal deficits noted.  Psych: Alert, appropriate and with normal affect.   LABORATORY DATA:  EKG:  EKG is not ordered today.    Lab Results  Component Value Date   WBC 7.6 12/08/2014   HGB 13.9 12/08/2014   HCT 42.0 12/08/2014   PLT 214 12/08/2014   GLUCOSE 95 12/20/2016   CHOL 159 12/20/2016   TRIG 122 12/20/2016   HDL 44 12/20/2016   LDLCALC 91 12/20/2016   ALT 12 12/20/2016   AST 16 12/20/2016   NA 141 12/20/2016   K 4.6 12/20/2016   CL 101 12/20/2016   CREATININE 0.80 12/20/2016   BUN 14 12/20/2016   CO2 23 12/20/2016   INR 1.11 12/08/2014     BNP (last 3 results) No results for input(s): BNP in the last 8760 hours.  ProBNP (last 3 results) No results for input(s): PROBNP in the last 8760 hours.   Other Studies Reviewed Today:  Echo Study Conclusions 01/2018  - Left ventricle: The cavity size was normal. Wall thickness was  normal. Systolic function was moderately to severely reduced. The  estimated ejection fraction was in the  range of 30% to 35%.  Akinesis and scarring of the mid-apicalanterior, anterolateral,  and apical myocardium; consistent with infarction in the  distribution of the left anterior descending coronary artery.  Features are consistent with a pseudonormal left ventricular  filling pattern, with concomitant abnormal relaxation and  increased filling pressure (grade 2 diastolic dysfunction).  Acoustic contrast opacification revealed no evidence ofthrombus.  - Mitral valve: There was mild regurgitation.  - Left atrium: The atrium was mildly dilated.  -  Right atrium: The atrium was mildly dilated.    ASSESSMENT AND PLAN:  1. CAD - doing well - no worrisome symptoms.   2. ICM with chronic systolic HF with EF of 30 to 40% - now on Entresto - doing well - needs BMET today.   3. Obesity - now doing intermittent fasting - she has been successful with this.   4. Underlying ICD - followed by EP.   5. HLD - on statin  Current medicines are reviewed with the patient today.  The patient does not have concerns regarding medicines other than what has been noted above.  The following changes have been made:  See above.   Labs/ tests ordered today include:    Orders Placed This Encounter  Procedures  . Basic metabolic panel     Disposition:   FU with Dr. Elease Hashimoto in about 4 months. BMET today. She is encouraged to keep up her good work with weight loss.      Patient is agreeable to this plan and will call if any problems develop in the interim.   SignedNorma Fredrickson, NP  04/21/2020 11:13 AM  [facility] [address] [place], [place]  [address] Phone: [phone] Fax: [phone]

## 2020-04-21 ENCOUNTER — Ambulatory Visit: Payer: Medicare Other | Admitting: Nurse Practitioner

## 2020-04-21 ENCOUNTER — Other Ambulatory Visit: Payer: Self-pay

## 2020-04-21 ENCOUNTER — Encounter: Payer: Self-pay | Admitting: Nurse Practitioner

## 2020-04-21 VITALS — BP 124/60 | HR 80 | Ht 62.0 in | Wt 244.0 lb

## 2020-04-21 DIAGNOSIS — I255 Ischemic cardiomyopathy: Secondary | ICD-10-CM | POA: Diagnosis not present

## 2020-04-21 DIAGNOSIS — I5022 Chronic systolic (congestive) heart failure: Secondary | ICD-10-CM

## 2020-04-21 DIAGNOSIS — I2510 Atherosclerotic heart disease of native coronary artery without angina pectoris: Secondary | ICD-10-CM | POA: Diagnosis not present

## 2020-04-21 DIAGNOSIS — Z79899 Other long term (current) drug therapy: Secondary | ICD-10-CM

## 2020-04-21 DIAGNOSIS — Z95810 Presence of automatic (implantable) cardiac defibrillator: Secondary | ICD-10-CM

## 2020-04-21 DIAGNOSIS — I2583 Coronary atherosclerosis due to lipid rich plaque: Secondary | ICD-10-CM

## 2020-04-21 LAB — BASIC METABOLIC PANEL
BUN/Creatinine Ratio: 19 (ref 12–28)
BUN: 17 mg/dL (ref 8–27)
CO2: 25 mmol/L (ref 20–29)
Calcium: 9.2 mg/dL (ref 8.7–10.3)
Chloride: 101 mmol/L (ref 96–106)
Creatinine, Ser: 0.9 mg/dL (ref 0.57–1.00)
GFR calc Af Amer: 75 mL/min/{1.73_m2} (ref >59)
GFR calc non Af Amer: 65 mL/min/{1.73_m2} (ref >59)
Glucose: 94 mg/dL (ref 65–99)
Potassium: 4.4 mmol/L (ref 3.5–5.2)
Sodium: 140 mmol/L (ref 134–144)

## 2020-04-21 NOTE — Patient Instructions (Addendum)
After Visit Summary:  We will be checking the following labs today - BMET   Medication Instructions:    Continue with your current medicines.    If you need a refill on your cardiac medications before your next appointment, please call your pharmacy.     Testing/Procedures To Be Arranged:  N/A  Follow-Up:   See Dr. Elease Hashimoto in 4 months     At [facility], you and your health needs are our priority.  As part of our continuing mission to provide you with exceptional heart care, we have created designated Provider Care Teams.  These Care Teams include your primary Cardiologist (physician) and Advanced Practice Providers (APPs -  Physician Assistants and Nurse Practitioners) who all work together to provide you with the care you need, when you need it.  Special Instructions:  . Stay safe, wash your hands for at least 20 seconds and wear a mask when needed.  . It was good to talk with you today.  Marland Kitchen Keep up the good work!!   Call the [facility] office at [phone] if you have any questions, problems or concerns.

## 2020-04-22 ENCOUNTER — Other Ambulatory Visit: Payer: Self-pay | Admitting: Cardiovascular Disease

## 2020-05-12 ENCOUNTER — Other Ambulatory Visit: Payer: Self-pay | Admitting: Cardiovascular Disease

## 2020-06-01 ENCOUNTER — Other Ambulatory Visit: Payer: Self-pay | Admitting: Cardiovascular Disease

## 2020-06-01 ENCOUNTER — Ambulatory Visit (INDEPENDENT_AMBULATORY_CARE_PROVIDER_SITE_OTHER): Payer: Medicare Other

## 2020-06-01 DIAGNOSIS — I255 Ischemic cardiomyopathy: Secondary | ICD-10-CM

## 2020-06-02 LAB — CUP PACEART REMOTE DEVICE CHECK
Battery Remaining Longevity: 81 mo
Battery Voltage: 3 V
Brady Statistic RV Percent Paced: 0.01 %
Date Time Interrogation Session: 20220314022705
HighPow Impedance: 97 Ohm
Implantable Lead Implant Date: 20160919
Implantable Lead Location: 753860
Implantable Lead Model: 6935
Implantable Pulse Generator Implant Date: 20160919
Lead Channel Impedance Value: 456 Ohm
Lead Channel Impedance Value: 532 Ohm
Lead Channel Pacing Threshold Amplitude: 0.75 V
Lead Channel Pacing Threshold Pulse Width: 0.4 ms
Lead Channel Sensing Intrinsic Amplitude: 15.625 mV
Lead Channel Sensing Intrinsic Amplitude: 15.625 mV
Lead Channel Setting Pacing Amplitude: 2 V
Lead Channel Setting Pacing Pulse Width: 0.4 ms
Lead Channel Setting Sensing Sensitivity: 0.3 mV

## 2020-06-09 NOTE — Progress Notes (Signed)
Remote ICD transmission.   

## 2020-06-21 ENCOUNTER — Other Ambulatory Visit: Payer: Self-pay | Admitting: Cardiovascular Disease

## 2020-07-03 ENCOUNTER — Other Ambulatory Visit: Payer: Self-pay | Admitting: Cardiovascular Disease

## 2020-07-17 ENCOUNTER — Other Ambulatory Visit: Payer: Self-pay | Admitting: Cardiovascular Disease

## 2020-07-20 ENCOUNTER — Other Ambulatory Visit: Payer: Self-pay | Admitting: Cardiovascular Disease

## 2020-07-22 ENCOUNTER — Other Ambulatory Visit: Payer: Self-pay | Admitting: Cardiovascular Disease

## 2020-07-27 ENCOUNTER — Telehealth: Payer: Self-pay | Admitting: Cardiovascular Disease

## 2020-07-27 NOTE — Telephone Encounter (Signed)
    Pt c/o medication issue:  1. Name of Medication: Lisinopril 20 mg  2. How are you currently taking this medication (dosage and times per day)? 1 tablet a day  3. Are you having a reaction (difficulty breathing--STAT)?   4. What is your medication issue? Pt said she is trying to refill this medication but Dr. Elease Hashimoto not approving it. It is not on pt's medication list, pt said she's been taking this for a long time and she is out. If Dr. Elease Hashimoto recommends for her to continue taking this pt said please send refill to her pharmacy

## 2020-07-27 NOTE — Telephone Encounter (Signed)
RN returned call to patient who states she was needing clarification of her medications. Patient stated it was time to refill her supply of medicine and the lisinopril kept being denied. RN advised that per office notes from 03/24/2020, the lisinopril was to be discontinued and she was start Entresto. Patient states she has been taking the [facility] when she reviewed her bottles. Patient verbalized understanding. Patient appreciative of phone call.

## 2020-07-28 NOTE — Telephone Encounter (Signed)
Agree with note by Terrilyn Saver.  It is very important that she NOT take any ACE inhibitor along with Entresto.  There can be a severe reaction if someone takes both. This was discussed at great length at the time we changed her to [facility]

## 2020-08-21 ENCOUNTER — Other Ambulatory Visit: Payer: Self-pay | Admitting: Cardiovascular Disease

## 2020-08-31 ENCOUNTER — Ambulatory Visit: Payer: Medicare Other | Admitting: Cardiovascular Disease

## 2020-08-31 ENCOUNTER — Ambulatory Visit (INDEPENDENT_AMBULATORY_CARE_PROVIDER_SITE_OTHER): Payer: Medicare Other

## 2020-08-31 DIAGNOSIS — I255 Ischemic cardiomyopathy: Secondary | ICD-10-CM

## 2020-09-03 LAB — CUP PACEART REMOTE DEVICE CHECK
Battery Remaining Longevity: 77 mo
Battery Voltage: 3 V
Brady Statistic RV Percent Paced: 0.01 %
Date Time Interrogation Session: 20220613022704
HighPow Impedance: 88 Ohm
Implantable Lead Implant Date: 20160919
Implantable Lead Location: 753860
Implantable Lead Model: 6935
Implantable Pulse Generator Implant Date: 20160919
Lead Channel Impedance Value: 456 Ohm
Lead Channel Impedance Value: 551 Ohm
Lead Channel Pacing Threshold Amplitude: 0.75 V
Lead Channel Pacing Threshold Pulse Width: 0.4 ms
Lead Channel Sensing Intrinsic Amplitude: 17.25 mV
Lead Channel Sensing Intrinsic Amplitude: 17.25 mV
Lead Channel Setting Pacing Amplitude: 2 V
Lead Channel Setting Pacing Pulse Width: 0.4 ms
Lead Channel Setting Sensing Sensitivity: 0.3 mV

## 2020-09-19 ENCOUNTER — Other Ambulatory Visit: Payer: Self-pay | Admitting: Cardiovascular Disease

## 2020-09-22 NOTE — Telephone Encounter (Signed)
Rx(s) sent to pharmacy electronically.  

## 2020-09-22 NOTE — Progress Notes (Signed)
Remote ICD transmission.   

## 2020-09-29 ENCOUNTER — Telehealth: Payer: Self-pay | Admitting: Cardiovascular Disease

## 2020-09-29 NOTE — Telephone Encounter (Signed)
Can we see if she qualifies for pt assistance?

## 2020-09-29 NOTE — Telephone Encounter (Signed)
     Pt c/o medication issue:  1. Name of Medication: ENTRESTO 49-51 MG  2. How are you currently taking this medication (dosage and times per day)? Take 1 tablet by mouth twice daily  3. Are you having a reaction (difficulty breathing--STAT)?   4. What is your medication issue? Pt said she went to pick up her refill for entesto and it will cost her $120 for 30 days supply, she said she can't afford that and would like to know if there's another copay card she can get to lower the cost

## 2020-09-29 NOTE — Telephone Encounter (Signed)
**Note De-Identified Blessing Zaucha Obfuscation** The pt and I discussed her applying pt asst for her Entresto through [employer] and she is interested in applying. I gave her [employer]'s phone number and advised her to call them with questions concerning their Entresto program and her eligibility to be approved for the program.  She is aware that if it appears that she is eligible, to request that they mail her an application to her home and that once she receives it to complete her part, obtain required documents per [employer] pt asst [employer], and to bring all to Dr [facility] office at [employer] in [place] to drop off at the front office. She is also aware that we will take care of the provider page of her application and will fax all to [employer].  She thanked me for calling her to discuss.

## 2020-10-19 ENCOUNTER — Other Ambulatory Visit: Payer: Self-pay

## 2020-10-19 ENCOUNTER — Encounter: Payer: Self-pay | Admitting: Cardiovascular Disease

## 2020-10-19 ENCOUNTER — Ambulatory Visit: Payer: Medicare Other | Admitting: Cardiovascular Disease

## 2020-10-19 VITALS — BP 122/68 | HR 74 | Ht 62.0 in | Wt 240.0 lb

## 2020-10-19 DIAGNOSIS — I5022 Chronic systolic (congestive) heart failure: Secondary | ICD-10-CM | POA: Diagnosis not present

## 2020-10-19 DIAGNOSIS — I2510 Atherosclerotic heart disease of native coronary artery without angina pectoris: Secondary | ICD-10-CM | POA: Diagnosis not present

## 2020-10-19 DIAGNOSIS — I2583 Coronary atherosclerosis due to lipid rich plaque: Secondary | ICD-10-CM | POA: Diagnosis not present

## 2020-10-19 DIAGNOSIS — I255 Ischemic cardiomyopathy: Secondary | ICD-10-CM

## 2020-10-19 NOTE — Progress Notes (Signed)
Cardiology Office Note   Date:  10/19/2020   ID:  [name], DOB 1950/04/16, MRN [id number]  PCP:  Deloris Ping, MD  Cardiologist:   Kristeen Miss, MD   No chief complaint on file.  1. Coronary artery disease-status post chronic LAD occlusion with a large anterior wall myocardial infarction 2. Congestive heart failure-EF of 30-40% 3. AICD placement 4. Hyperlipidemia 5 obesity  Previous notes:   [name] is doing very well from a cardiac standpoint. She's not had any episodes of chest pain or shortness of breath. She is able to do all of her normal activities without any significant problems.  She has not had much success in losing weight  She denies any syncope or presyncope.  September 14, 2012:  [name]  is doing well. She's not having any episodes of chest pain or shortness of breath.  Her ICD has been interrogated recently and was found to be working properly.   She has no CP or dyspnea when she does her regular activities.  She does not get any regular exercise.   June 13, 2013:  [name] is doing ok.  She is exercising some.   Walks about 20 minutes a day - several days a week.   No CP or dyspnea.     Feb. 25, 2016:  [name] is a 70 y.o. female who presents for follow up of her CAD and CHF.   Her ICD is working well.  She will need to have the battery checked more often recently because  it is nearing ERI.  She is not getting exercise.   Denies any chest pain  Or dyspnea.   Needs to lose weight.  [facility]   November 12, 2014:  Patient is doing well. No cp or dyspnea. Perhaps getting some exercise  Sept. 1, 2017:  Is doing well.  Followed for CHF and ICD placement  Has hx of chronic systlic CHF Breathing is good.  No CP   Oct. 2, 2018:    [name] is seen today for follow up ov her CAD, CHF and ICD placement .    Wt = 243 lbs. ( up 4 lbs since last years visit )   January 11, 2018:   [name] is seen today for follow-up of her  coronary artery disease, congestive heart failure and ICD placement.  Her weight today is 239 pounds. Still eating salty foods   - hot dogs once a week , bacon once month,  Canned veggies weekly .    Nov. 30, 2020:  Has tried to cut out her salt .  Wt is 247 lbs ( up 8 lbs from last year ) No exercise . Especially with covid   Jan. 4, 2022: [name] is seen today for follow up of her CAD, CHF.  Has an AICD  She was seen by Dr. Ladona Ridgel today  Wt. Is 255 lbs. 9 up 8 lbs from last year)  No cp, no dyspnea  Exercised during the summer.  None recently .   Going to start with a program  at church .  Is on Lisinopril - we discussed Entresto  We discussed the washout of lisinopril that is needed.   October 19, 2020:  [name] is doing well from a cardiac standpoint  Wt is 240 lbs. Tolerating the eliquis well ( she doesn t like the price )  Has noticed an improvement in her breathing    Past  Medical History:  Diagnosis Date   AICD (automatic cardioverter/defibrillator) present 12/08/2014   CAD (coronary artery disease)    CHRONICALLY OCCLUDED LAD WITH A LARGE ANTERIOR WALL MI    CHF (congestive heart failure) (HCC)    EF 30-40%   Dyslipidemia    Hypertension    ICD (implantable cardiac defibrillator), single, in situ    MEDTRONIC   Ischemic cardiomyopathy    Myocardial infarction [facility])    ANTERIOR WALL    Past Surgical History:  Procedure Laterality Date   CARDIAC CATHETERIZATION  01/13/05, 06/29/99   CARDIAC DEFIBRILLATOR PLACEMENT  02/21/05   SINGLE/MEDTRONIC   EP IMPLANTABLE DEVICE N/A 11/14/2014   Procedure:  ICD Generator Changeout with new lead & capping old [id number] lead;  Surgeon: Marinus Maw, MD;  Location: [facility] INVASIVE CV LAB;  Service: Cardiovascular;  Laterality: N/A;   EP IMPLANTABLE DEVICE N/A 12/08/2014   Procedure: ICD Implant;  Surgeon: Marinus Maw, MD;  Location: [facility] INVASIVE CV LAB;  Service: Cardiovascular;  Laterality: N/A;     Current Outpatient Medications   Medication Sig Dispense Refill   aspirin EC 81 MG tablet Take 81 mg by mouth daily.     carvedilol (COREG) 25 MG tablet TAKE 1 & 1/2 (ONE & ONE-HALF) TABLETS BY MOUTH TWICE DAILY 270 tablet 0   cetirizine (ZYRTEC) 10 MG tablet Take 10 mg by mouth daily as needed for allergies.     Cholecalciferol (VITAMIN D) 2000 UNITS CAPS Take 1 capsule (2,000 Units total) by mouth daily. 30 capsule    citalopram (CELEXA) 40 MG tablet Take 40 mg by mouth daily.      ENTRESTO 49-51 MG Take 1 tablet by mouth twice daily 60 tablet 3   fexofenadine (ALLEGRA) 180 MG tablet Take 180 mg by mouth daily as needed for allergies or rhinitis.     furosemide (LASIX) 40 MG tablet Take 1 tablet by mouth once daily 90 tablet 3   montelukast (SINGULAIR) 10 MG tablet Take 10 mg by mouth as needed.   0   potassium chloride (KLOR-CON) 10 MEQ tablet Take 1 tablet by mouth once daily 90 tablet 0   pravastatin (PRAVACHOL) 20 MG tablet Take 1 tablet by mouth once daily 90 tablet 2   vitamin B-12 (CYANOCOBALAMIN) 100 MCG tablet Take 1 tablet by mouth daily.     mirtazapine (REMERON) 15 MG tablet Take 1 tablet by mouth at bedtime.     No current facility-administered medications for this visit.    Allergies:   Codeine, Crestor [rosuvastatin], Doxycycline, Ecotrin [aspirin], Lipitor [atorvastatin], Nsaids, Penicillins, Sulfa drugs cross reactors, Vitamin c, and Zocor [simvastatin]    Social History:  The patient  reports that she has never smoked. She has never used smokeless tobacco. She reports that she does not drink alcohol and does not use drugs.   Family History:  The patient's family history includes Aneurysm in her brother; Coronary artery disease in her mother; Heart attack (age of onset: 57) in her mother; Hypertension in her father.    ROS:  Please see the history of present illness.   Physical Exam: Blood pressure 122/68, pulse 74, height 5\' 2"  (1.575 m), weight 240 lb (108.9 kg).  GEN:  Well nourished, well  developed in no acute distress HEENT: Normal NECK: No JVD; No carotid bruits LYMPHATICS: No lymphadenopathy CARDIAC: RRR , no murmurs, rubs, gallops RESPIRATORY:  Clear to auscultation without rales, wheezing or rhonchi  ABDOMEN: Soft, non-tender, non-distended MUSCULOSKELETAL:  No edema; No  deformity  SKIN: Warm and dry NEUROLOGIC:  Alert and oriented x 3     EKG:     Recent Labs: 04/21/2020: BUN 17; Creatinine, Ser 0.90; Potassium 4.4; Sodium 140    Lipid Panel    Component Value Date/Time   CHOL 159 12/20/2016 1039   TRIG 122 12/20/2016 1039   HDL 44 12/20/2016 1039   CHOLHDL 3.6 12/20/2016 1039   CHOLHDL 3 11/10/2014 0926   VLDL 18.6 11/10/2014 0926   LDLCALC 91 12/20/2016 1039      Wt Readings from Last 3 Encounters:  10/19/20 240 lb (108.9 kg)  04/21/20 244 lb (110.7 kg)  03/24/20 255 lb (115.7 kg)      Other studies Reviewed: Additional studies/ records that were reviewed today include: . Review of the above records demonstrates:    ASSESSMENT AND PLAN:  1. Coronary artery disease-   no angina .  Cont meds    2. Congestive heart failure-EF of 30-40% -   we started Entresto at our last visit.  She is definitely feeling better.  Is costing her $100 / month  Since she is feeling better, will cont.  Hopefully, the cost will come down check lipids, ALT to day     3. AICD placement -      4. Hyperlipidemia -   t  5 obesity - encouraged weight loss   Current medicines are reviewed at length with the patient today.  The patient does not have concerns regarding medicines.  The following changes have been made:  no change   Disposition:       Signed, Kristeen Miss, MD  10/19/2020 4:16 PM    [facility] [address] [place], [place], [place]  [address] Phone: [phone]; Fax: [phone]

## 2020-10-19 NOTE — Patient Instructions (Signed)
[employer] Patient Assistance for [employer]: [phone]  Medication Instructions:  Your physician recommends that you continue on your current medications as directed. Please refer to the Current Medication list given to you today.  *If you need a refill on your cardiac medications before your next appointment, please call your pharmacy*   Lab Work: TODAY: FLP, ALT, BMET If you have labs (blood work) drawn today and your tests are completely normal, you will receive your results only by: MyChart Message (if you have MyChart) OR A paper copy in the mail If you have any lab test that is abnormal or we need to change your treatment, we will call you to review the results.   Testing/Procedures: Your physician has requested that you have an echocardiogram. Echocardiography is a painless test that uses sound waves to create images of your heart. It provides your doctor with information about the size and shape of your heart and how well your heart's chambers and valves are working. This procedure takes approximately one hour. There are no restrictions for this procedure.  Follow-Up: At [facility], you and your health needs are our priority.  As part of our continuing mission to provide you with exceptional heart care, we have created designated Provider Care Teams.  These Care Teams include your primary Cardiologist (physician) and Advanced Practice Providers (APPs -  Physician Assistants and Nurse Practitioners) who all work together to provide you with the care you need, when you need it.  Your next appointment:   6 month(s)  The format for your next appointment:   In Person  Provider:   You may see Kristeen Miss, MD or one of the following Advanced Practice Providers on your designated Care Team:   Tereso Newcomer, PA-C [place], [place]

## 2020-10-20 LAB — BASIC METABOLIC PANEL
BUN/Creatinine Ratio: 15 (ref 12–28)
BUN: 15 mg/dL (ref 8–27)
CO2: 28 mmol/L (ref 20–29)
Calcium: 8.7 mg/dL (ref 8.7–10.3)
Chloride: 101 mmol/L (ref 96–106)
Creatinine, Ser: 0.98 mg/dL (ref 0.57–1.00)
Glucose: 89 mg/dL (ref 65–99)
Potassium: 4.4 mmol/L (ref 3.5–5.2)
Sodium: 143 mmol/L (ref 134–144)
eGFR: 62 mL/min/{1.73_m2} (ref >59)

## 2020-10-20 LAB — LIPID PANEL
Chol/HDL Ratio: 3.3 ratio (ref 0.0–4.4)
Cholesterol, Total: 192 mg/dL (ref 100–199)
HDL: 59 mg/dL (ref >39)
LDL Chol Calc (NIH): 106 mg/dL — ABNORMAL HIGH (ref 0–99)
Triglycerides: 157 mg/dL — ABNORMAL HIGH (ref 0–149)
VLDL Cholesterol Cal: 27 mg/dL (ref 5–40)

## 2020-10-20 LAB — ALT: ALT: 10 IU/L (ref 0–32)

## 2020-10-28 ENCOUNTER — Telehealth: Payer: Self-pay | Admitting: Nurse Practitioner

## 2020-10-28 DIAGNOSIS — E785 Hyperlipidemia, unspecified: Secondary | ICD-10-CM

## 2020-10-28 DIAGNOSIS — I2583 Coronary atherosclerosis due to lipid rich plaque: Secondary | ICD-10-CM

## 2020-10-28 DIAGNOSIS — I2510 Atherosclerotic heart disease of native coronary artery without angina pectoris: Secondary | ICD-10-CM

## 2020-10-28 MED ORDER — PRAVASTATIN SODIUM 40 MG PO TABS: 40.0000 mg | ORAL_TABLET | Freq: Every evening | ORAL | 3 refills | Status: DC

## 2020-10-28 NOTE — Telephone Encounter (Signed)
Patient was advised of lab results on 8/4 and advised that she was intolerant of rosuvastatin and other statin medications except pravastatin. LDL is above goal of 70 mg/dL.  I called patient on 8/10 and advised her to increase her pravastatin to 40 mg daily to which she is in agreement. She will return on 11/10 for repeat alt and lipid panel. I advised her to call back with additional questions or concerns prior to that appointment and she thanked me for the call.

## 2020-10-28 NOTE — Telephone Encounter (Signed)
-----   Message from Vesta Mixer, MD sent at 10/20/2020  9:38 AM EDT ----- ALT is stable LDL remains elevated.  She is intolerent to simvastatin,   Lets see if she will tolerate Rosuvastatin 10 mg a day  She has a hx of CAD and her LDL goal is 55 Check ALT and lipid profile in 3 months  Please send a copy of these labs to her primary - Dr. Cordelia Pen R. Idamae Lusher primary care, Kathryne Sharper

## 2020-11-05 ENCOUNTER — Other Ambulatory Visit: Payer: Self-pay

## 2020-11-05 ENCOUNTER — Ambulatory Visit (HOSPITAL_COMMUNITY): Payer: Medicare Other | Attending: Cardiovascular Disease

## 2020-11-05 ENCOUNTER — Other Ambulatory Visit: Payer: Self-pay | Admitting: Cardiovascular Disease

## 2020-11-05 DIAGNOSIS — I5022 Chronic systolic (congestive) heart failure: Secondary | ICD-10-CM | POA: Diagnosis not present

## 2020-11-05 DIAGNOSIS — I2583 Coronary atherosclerosis due to lipid rich plaque: Secondary | ICD-10-CM

## 2020-11-05 DIAGNOSIS — I2510 Atherosclerotic heart disease of native coronary artery without angina pectoris: Secondary | ICD-10-CM

## 2020-11-05 DIAGNOSIS — I255 Ischemic cardiomyopathy: Secondary | ICD-10-CM | POA: Diagnosis not present

## 2020-11-05 LAB — ECHOCARDIOGRAM COMPLETE
Area-P 1/2: 4.26 cm2
S' Lateral: 3.5 cm

## 2020-11-05 MED ORDER — PERFLUTREN LIPID MICROSPHERE
1.0000 mL | INTRAVENOUS | Status: AC | PRN
  Administered 2020-11-05: 3 mL via INTRAVENOUS

## 2020-11-09 ENCOUNTER — Telehealth: Payer: Self-pay | Admitting: Cardiovascular Disease

## 2020-11-09 MED ORDER — LOSARTAN POTASSIUM 50 MG PO TABS: 50.0000 mg | ORAL_TABLET | Freq: Every day | ORAL | 3 refills | Status: DC

## 2020-11-09 NOTE — Telephone Encounter (Signed)
Pt c/o medication issue:  1. Name of Medication: ENTRESTO 49-51 MG  2. How are you currently taking this medication (dosage and times per day)?as prescribed   3. Are you having a reaction (difficulty breathing--STAT)? no  4. What is your medication issue? Pt is caling in regards to this medication.pt feels it is not working and she needs to be put on something else

## 2020-11-09 NOTE — Telephone Encounter (Signed)
Spoke with the patient and advised her that Dr. Elease Hashimoto is going to switch her from [facility] to losartan 50 mg daily. Patient verbalized understanding. Rx has been sent in .

## 2020-11-09 NOTE — Telephone Encounter (Signed)
Patient states that Sherryll Burger was going to cost her $460 this month. She states that she can not afford that and would like to go on her previous medication. She states that she does not want to deal with Patient Assistance. She tried last month and states that it was too stressful. Patient just wants to go back on previous medications. She states that Sherryll Burger has been working and she is feeling good.  Patient was previously taking lisinopril 20 mg daily.  Once again offered patient assistance with helping to reduce the cost of Entresto. Patient refused and states that she just wants to go back on prior med.

## 2020-11-30 ENCOUNTER — Ambulatory Visit (INDEPENDENT_AMBULATORY_CARE_PROVIDER_SITE_OTHER): Payer: Medicare Other

## 2020-11-30 DIAGNOSIS — I255 Ischemic cardiomyopathy: Secondary | ICD-10-CM | POA: Diagnosis not present

## 2020-11-30 DIAGNOSIS — I5022 Chronic systolic (congestive) heart failure: Secondary | ICD-10-CM

## 2020-12-01 LAB — CUP PACEART REMOTE DEVICE CHECK
Battery Remaining Longevity: 72 mo
Battery Voltage: 2.99 V
Brady Statistic RV Percent Paced: 0.01 %
Date Time Interrogation Session: 20220913001609
HighPow Impedance: 88 Ohm
Implantable Lead Implant Date: 20160919
Implantable Lead Location: 753860
Implantable Lead Model: 6935
Implantable Pulse Generator Implant Date: 20160919
Lead Channel Impedance Value: 418 Ohm
Lead Channel Impedance Value: 532 Ohm
Lead Channel Pacing Threshold Amplitude: 0.875 V
Lead Channel Pacing Threshold Pulse Width: 0.4 ms
Lead Channel Sensing Intrinsic Amplitude: 13 mV
Lead Channel Sensing Intrinsic Amplitude: 13 mV
Lead Channel Setting Pacing Amplitude: 2 V
Lead Channel Setting Pacing Pulse Width: 0.4 ms
Lead Channel Setting Sensing Sensitivity: 0.3 mV

## 2020-12-04 NOTE — Progress Notes (Signed)
Remote ICD transmission.   

## 2020-12-21 ENCOUNTER — Other Ambulatory Visit: Payer: Self-pay | Admitting: Cardiovascular Disease

## 2021-01-28 ENCOUNTER — Other Ambulatory Visit: Payer: Self-pay

## 2021-01-28 ENCOUNTER — Other Ambulatory Visit: Payer: Medicare Other | Admitting: *Deleted

## 2021-01-28 DIAGNOSIS — I2510 Atherosclerotic heart disease of native coronary artery without angina pectoris: Secondary | ICD-10-CM

## 2021-01-28 DIAGNOSIS — E785 Hyperlipidemia, unspecified: Secondary | ICD-10-CM

## 2021-01-28 LAB — LIPID PANEL
Chol/HDL Ratio: 3.3 ratio (ref 0.0–4.4)
Cholesterol, Total: 146 mg/dL (ref 100–199)
HDL: 44 mg/dL (ref >39)
LDL Chol Calc (NIH): 85 mg/dL (ref 0–99)
Triglycerides: 91 mg/dL (ref 0–149)
VLDL Cholesterol Cal: 17 mg/dL (ref 5–40)

## 2021-01-28 LAB — ALT: ALT: 14 IU/L (ref 0–32)

## 2021-03-01 ENCOUNTER — Ambulatory Visit (INDEPENDENT_AMBULATORY_CARE_PROVIDER_SITE_OTHER): Payer: Medicare Other

## 2021-03-01 DIAGNOSIS — I255 Ischemic cardiomyopathy: Secondary | ICD-10-CM

## 2021-03-01 LAB — CUP PACEART REMOTE DEVICE CHECK
Battery Remaining Longevity: 67 mo
Battery Voltage: 2.99 V
Brady Statistic RV Percent Paced: 0.01 %
Date Time Interrogation Session: 20221212022703
HighPow Impedance: 93 Ohm
Implantable Lead Implant Date: 20160919
Implantable Lead Location: 753860
Implantable Lead Model: 6935
Implantable Pulse Generator Implant Date: 20160919
Lead Channel Impedance Value: 456 Ohm
Lead Channel Impedance Value: 532 Ohm
Lead Channel Pacing Threshold Amplitude: 0.75 V
Lead Channel Pacing Threshold Pulse Width: 0.4 ms
Lead Channel Sensing Intrinsic Amplitude: 10.375 mV
Lead Channel Sensing Intrinsic Amplitude: 10.375 mV
Lead Channel Setting Pacing Amplitude: 2 V
Lead Channel Setting Pacing Pulse Width: 0.4 ms
Lead Channel Setting Sensing Sensitivity: 0.3 mV

## 2021-03-09 NOTE — Progress Notes (Signed)
Remote ICD transmission.   

## 2021-03-27 ENCOUNTER — Other Ambulatory Visit: Payer: Self-pay | Admitting: Cardiovascular Disease

## 2021-04-13 ENCOUNTER — Other Ambulatory Visit: Payer: Self-pay

## 2021-04-13 ENCOUNTER — Encounter: Payer: Self-pay | Admitting: Internal Medicine

## 2021-04-13 ENCOUNTER — Ambulatory Visit: Payer: Medicare Other | Admitting: Internal Medicine

## 2021-04-13 VITALS — BP 132/72 | HR 70 | Ht 62.0 in | Wt 259.0 lb

## 2021-04-13 DIAGNOSIS — I5022 Chronic systolic (congestive) heart failure: Secondary | ICD-10-CM

## 2021-04-13 DIAGNOSIS — I255 Ischemic cardiomyopathy: Secondary | ICD-10-CM

## 2021-04-13 DIAGNOSIS — Z95810 Presence of automatic (implantable) cardiac defibrillator: Secondary | ICD-10-CM

## 2021-04-13 NOTE — Progress Notes (Signed)
HPI Mrs. Bergdoll returns today for followup. She is a pleasant 71 yo woman with a h/o CAD, s/p MI, chronic systolic heart failure and morbid obesity. The patient has been quiet lately but has not lost weight unfortunately she has gained. She denies chest pain or sob but does admit to dietary indiscretion. No chest pain. No ICD therapies. She had lost 24 lbs and then her good friend got sick and she helped care for her several months ago and got off of her fasting diet and gained all of the weight back.  Allergies  Allergen Reactions   Codeine Nausea And Vomiting   Crestor [Rosuvastatin] Other (See Comments)    Muscle aches   Doxycycline Nausea Only   Ecotrin [Aspirin] Other (See Comments)    BLEEDING ULCER - ONLY IN HIGH DOSES   Lipitor [Atorvastatin] Other (See Comments)    Muscle aches   Nsaids Other (See Comments)    BLEEDING ULCER   Penicillins Rash   Sulfa Drugs Cross Reactors Nausea And Vomiting   Vitamin C Other (See Comments)    Upsets stomach   Zocor [Simvastatin] Other (See Comments)    Muscle aches     Current Outpatient Medications  Medication Sig Dispense Refill   aspirin EC 81 MG tablet Take 81 mg by mouth daily.     carvedilol (COREG) 25 MG tablet TAKE 1 & 1/2 (ONE & ONE-HALF) TABLETS BY MOUTH TWICE DAILY 270 tablet 2   cetirizine (ZYRTEC) 10 MG tablet Take 10 mg by mouth daily as needed for allergies.     Cholecalciferol (VITAMIN D) 2000 UNITS CAPS Take 1 capsule (2,000 Units total) by mouth daily. 30 capsule    citalopram (CELEXA) 40 MG tablet Take 40 mg by mouth daily.      fexofenadine (ALLEGRA) 180 MG tablet Take 180 mg by mouth daily as needed for allergies or rhinitis.     furosemide (LASIX) 40 MG tablet Take 1 tablet by mouth once daily 90 tablet 3   losartan (COZAAR) 50 MG tablet Take 1 tablet (50 mg total) by mouth daily. 90 tablet 3   montelukast (SINGULAIR) 10 MG tablet Take 10 mg by mouth as needed.   0   potassium chloride (KLOR-CON) 10 MEQ  tablet Take 1 tablet by mouth once daily 90 tablet 0   pravastatin (PRAVACHOL) 40 MG tablet Take 1 tablet (40 mg total) by mouth every evening. 90 tablet 3   vitamin B-12 (CYANOCOBALAMIN) 100 MCG tablet Take 1 tablet by mouth daily.     mirtazapine (REMERON) 15 MG tablet Take 1 tablet by mouth at bedtime.     No current facility-administered medications for this visit.     Past Medical History:  Diagnosis Date   AICD (automatic cardioverter/defibrillator) present 12/08/2014   CAD (coronary artery disease)    CHRONICALLY OCCLUDED LAD WITH A LARGE ANTERIOR WALL MI    CHF (congestive heart failure) (HCC)    EF 30-40%   Dyslipidemia    Hypertension    ICD (implantable cardiac defibrillator), single, in situ    MEDTRONIC   Ischemic cardiomyopathy    Myocardial infarction (HCC)    ANTERIOR WALL    ROS:   All systems reviewed and negative except as noted in the HPI.   Past Surgical History:  Procedure Laterality Date   CARDIAC CATHETERIZATION  01/13/05, 06/29/99   CARDIAC DEFIBRILLATOR PLACEMENT  02/21/05   SINGLE/MEDTRONIC   EP IMPLANTABLE DEVICE N/A 11/14/2014   Procedure:  ICD Generator Changeout with new lead & capping old [phone] lead;  Surgeon: Marinus Maw, MD;  Location: [facility] INVASIVE CV LAB;  Service: Cardiovascular;  Laterality: N/A;   EP IMPLANTABLE DEVICE N/A 12/08/2014   Procedure: ICD Implant;  Surgeon: Marinus Maw, MD;  Location: [facility] INVASIVE CV LAB;  Service: Cardiovascular;  Laterality: N/A;     Family History  Problem Relation Age of Onset   Heart attack Mother 56   Coronary artery disease Mother    Aneurysm Brother    Hypertension Father      Social History   Socioeconomic History   Marital status: Married    Spouse name: Not on file   Number of children: Not on file   Years of education: Not on file   Highest education level: Not on file  Occupational History   Not on file  Tobacco Use   Smoking status: Never   Smokeless tobacco: Never   Substance and Sexual Activity   Alcohol use: No   Drug use: No   Sexual activity: Not on file  Other Topics Concern   Not on file  Social History Narrative   Not on file   Social Determinants of Health   Financial Resource Strain: Not on file  Food Insecurity: Not on file  Transportation Needs: Not on file  Physical Activity: Not on file  Stress: Not on file  Social Connections: Not on file  Intimate Partner Violence: Not on file     BP 132/72    Pulse 70    Ht 5\' 2"  (1.575 m)    Wt 259 lb (117.5 kg)    SpO2 95%    BMI 47.37 kg/m   Physical Exam:  Well appearing NAD HEENT: Unremarkable Neck:  No JVD, no thyromegally Lymphatics:  No adenopathy Back:  No CVA tenderness Lungs:  Clear HEART:  Regular rate rhythm, no murmurs, no rubs, no clicks Abd:  soft, positive bowel sounds, no organomegally, no rebound, no guarding Ext:  2 plus pulses, no edema, no cyanosis, no clubbing Skin:  No rashes no nodules Neuro:  CN II through XII intact, motor grossly intact  EKG - nsr with PVC's  DEVICE  Normal device function.  See PaceArt for details.   Assess/Plan:  1. Chronic systolic heart failure -her symptoms remain class 2.  2. Obesity - I strongly encouraged weight loss and discussed intermittent fasting again. 3. CAD - she denies anginal symptoms.  4. ICD - her medtronic ICD is working normally. No sustained arrhythmias.   Denys Labree,MD

## 2021-04-13 NOTE — Patient Instructions (Signed)
Medication Instructions:  Your physician recommends that you continue on your current medications as directed. Please refer to the Current Medication list given to you today.  Labwork: None ordered.  Testing/Procedures: None ordered.  Follow-Up: Your physician wants you to follow-up in: one year with Lewayne Bunting, MD or one of the following Advanced Practice Providers on your designated Care Team:   Francis Dowse, [place] Casimiro Needle "Mardelle Matte" Lanna Poche, [place]  Remote monitoring is used to monitor your ICD from home. This monitoring reduces the number of office visits required to check your device to one time per year. It allows [place] to keep an eye on the functioning of your device to ensure it is working properly. You are scheduled for a device check from home on 05/31/2021. You may send your transmission at any time that day. If you have a wireless device, the transmission will be sent automatically. After your physician reviews your transmission, you will receive a postcard with your next transmission date.  Any Other Special Instructions Will Be Listed Below (If Applicable).  If you need a refill on your cardiac medications before your next appointment, please call your pharmacy.

## 2021-04-22 ENCOUNTER — Other Ambulatory Visit: Payer: Self-pay

## 2021-04-22 ENCOUNTER — Ambulatory Visit: Payer: Medicare Other | Admitting: Cardiovascular Disease

## 2021-04-22 ENCOUNTER — Encounter: Payer: Self-pay | Admitting: Cardiovascular Disease

## 2021-04-22 VITALS — BP 138/72 | HR 74 | Ht 62.0 in | Wt 262.0 lb

## 2021-04-22 DIAGNOSIS — I5022 Chronic systolic (congestive) heart failure: Secondary | ICD-10-CM

## 2021-04-22 DIAGNOSIS — I2583 Coronary atherosclerosis due to lipid rich plaque: Secondary | ICD-10-CM

## 2021-04-22 DIAGNOSIS — I2510 Atherosclerotic heart disease of native coronary artery without angina pectoris: Secondary | ICD-10-CM | POA: Diagnosis not present

## 2021-04-22 MED ORDER — LOSARTAN POTASSIUM 100 MG PO TABS
100.0000 mg | ORAL_TABLET | Freq: Every day | ORAL | 3 refills | Status: DC
Start: 2021-04-22 — End: 2021-04-22

## 2021-04-22 MED ORDER — LOSARTAN POTASSIUM 50 MG PO TABS
50.0000 mg | ORAL_TABLET | Freq: Every day | ORAL | 3 refills | Status: DC
Start: 2021-04-22 — End: 2021-04-29

## 2021-04-22 NOTE — Telephone Encounter (Addendum)
Call placed to pt regarding mychart message.  Pt confirms she is not taking the Lisinopril.  Medication list has been adjusted.  Per Dr. Harvie Bridge recommendations, pt will keep the Losartan at 50 mg daily.  Pt verbalized understanding.

## 2021-04-22 NOTE — Patient Instructions (Addendum)
Check your medications and make sure that you are not taking both losartan and lisinopril.    Call the office back and ask for [facility].  [phone]     Medication Instructions:  Your physician recommends that you continue on your current medications as directed. Please refer to the Current Medication list given to you today.  *If you need a refill on your cardiac medications before your next appointment, please call your pharmacy*   Lab Work: None ordered  If you have labs (blood work) drawn today and your tests are completely normal, you will receive your results only by: [place] (if you have MyChart) OR A paper copy in the mail If you have any lab test that is abnormal or we need to change your treatment, we will call you to review the results.   Testing/Procedures: None ordered   Follow-Up: At [facility], you and your health needs are our priority.  As part of our continuing mission to provide you with exceptional heart care, we have created designated Provider Care Teams.  These Care Teams include your primary Cardiologist (physician) and Advanced Practice Providers (APPs -  Physician Assistants and Nurse Practitioners) who all work together to provide you with the care you need, when you need it.  We recommend signing up for the patient portal called "MyChart".  Sign up information is provided on this After Visit Summary.  MyChart is used to connect with patients for Virtual Visits (Telemedicine).  Patients are able to view lab/test results, encounter notes, upcoming appointments, etc.  Non-urgent messages can be sent to your provider as well.   To learn more about what you can do with MyChart, go to [email or web address].    Your next appointment:   12 month(s)  The format for your next appointment:   In Person  Provider:   Mertie Moores, MD  or Robbie Lis, PA-C, Christen Bame, NP, or Richardson Dopp, PA-C         Other Instructions Call Rico Junker  back and go over your medication list when you get home.

## 2021-04-22 NOTE — Addendum Note (Signed)
Addended by: Burnetta Sabin on: 04/22/2021 01:14 PM   Modules accepted: Orders

## 2021-04-22 NOTE — Addendum Note (Signed)
Addended by: Burnetta Sabin on: 04/22/2021 01:21 PM   Modules accepted: Orders

## 2021-04-22 NOTE — Progress Notes (Signed)
Cardiology Office Note   Date:  04/22/2021   ID:  [name] Y [place], DOB 09/05/1950, MRN [id number]  PCP:  Deloris Pingyter-Brown, Sherry M, MD  Cardiologist:   Kristeen MissPhilip Neelah Mannings, MD   Chief Complaint  Patient presents with   Congestive Heart Failure         1. Coronary artery disease-status post chronic LAD occlusion with a large anterior wall myocardial infarction 2. Congestive heart failure-EF of 30-40% 3. AICD placement 4. Hyperlipidemia 5 obesity  Previous notes:   [name] is doing very well from a cardiac standpoint. She's not had any episodes of chest pain or shortness of breath. She is able to do all of her normal activities without any significant problems.  She has not had much success in losing weight  She denies any syncope or presyncope.  September 14, 2012:  [name]  is doing well. She's not having any episodes of chest pain or shortness of breath.  Her ICD has been interrogated recently and was found to be working properly.   She has no CP or dyspnea when she does her regular activities.  She does not get any regular exercise.   June 13, 2013:  [name] is doing ok.  She is exercising some.   Walks about 20 minutes a day - several days a week.   No CP or dyspnea.     Feb. 25, 2016:  [name] is a 71 y.o. female who presents for follow up of her CAD and CHF.   Her ICD is working well.  She will need to have the battery checked more often recently because  it is nearing ERI.  She is not getting exercise.   Denies any chest pain  Or dyspnea.   Needs to lose weight.  [facility]   November 12, 2014:  Patient is doing well. No cp or dyspnea. Perhaps getting some exercise  Sept. 1, 2017:  Is doing well.  Followed for CHF and ICD placement  Has hx of chronic systlic CHF Breathing is good.  No CP   Oct. 2, 2018:    [name] is seen today for follow up ov her CAD, CHF and ICD placement .    Wt = 243 lbs. ( up 4 lbs since last years visit )   January 11, 2018:   [name] is seen today for follow-up of her coronary artery disease, congestive heart failure and ICD placement.  Her weight today is 239 pounds. Still eating salty foods   - hot dogs once a week , bacon once month,  Canned veggies weekly .    Nov. 30, 2020:  Has tried to cut out her salt .  Wt is 247 lbs ( up 8 lbs from last year ) No exercise . Especially with covid   Jan. 4, 2022: [name] is seen today for follow up of her CAD, CHF.  Has an AICD  She was seen by Dr. Ladona Ridgelaylor today  Wt. Is 255 lbs. 9 up 8 lbs from last year)  No cp, no dyspnea  Exercised during the summer.  None recently .   Going to start with a program  at [employer] .  Is on Lisinopril - we discussed Entresto  We discussed the washout of lisinopril that is needed.   October 19, 2020:  [name] is doing well from a cardiac standpoint  Wt is 240 lbs. Tolerating the eliquis well ( she doesn t like the price )  Has noticed an improvement in her breathing   Feb. 2, 2023 Doing well , hx of remote ant. MI  Did well on Entresto but the [place] cost too much  We started Losartan in its place.  Needs to have a hysterectomy  Med list has her taking losartan and Lisinopril She will call us when she gets home to verify that she is not on both meds  She needs to have a hysterectomy.  She is not had any cardiac problems.  She is at low risk for her upcoming hysterectomy.  She may hold her aspirin for 5 to 7 days prior to the surgery.   Past Medical History:  Diagnosis Date   AICD (automatic cardioverter/defibrillator) present 12/08/2014   CAD (coronary artery disease)    CHRONICALLY OCCLUDED LAD WITH A LARGE ANTERIOR WALL MI    CHF (congestive heart failure) (HCC)    EF 30-40%   Dyslipidemia    Hypertension    ICD (implantable cardiac defibrillator), single, in situ    MEDTRONIC   Ischemic cardiomyopathy    Myocardial infarction [facility])    ANTERIOR WALL    Past Surgical History:  Procedure Laterality Date    CARDIAC CATHETERIZATION  01/13/05, 06/29/99   CARDIAC DEFIBRILLATOR PLACEMENT  02/21/05   SINGLE/MEDTRONIC   EP IMPLANTABLE DEVICE N/A 11/14/2014   Procedure:  ICD Generator Changeout with new lead & capping old 6949 lead;  Surgeon: Marinus Maw, MD;  Location: [facility] INVASIVE CV LAB;  Service: Cardiovascular;  Laterality: N/A;   EP IMPLANTABLE DEVICE N/A 12/08/2014   Procedure: ICD Implant;  Surgeon: Marinus Maw, MD;  Location: [facility] INVASIVE CV LAB;  Service: Cardiovascular;  Laterality: N/A;     Current Outpatient Medications  Medication Sig Dispense Refill   aspirin EC 81 MG tablet Take 81 mg by mouth daily.     carvedilol (COREG) 25 MG tablet TAKE 1 & 1/2 (ONE & ONE-HALF) TABLETS BY MOUTH TWICE DAILY 270 tablet 2   cetirizine (ZYRTEC) 10 MG tablet Take 10 mg by mouth daily as needed for allergies.     Cholecalciferol (VITAMIN D) 2000 UNITS CAPS Take 1 capsule (2,000 Units total) by mouth daily. 30 capsule    citalopram (CELEXA) 40 MG tablet Take 40 mg by mouth daily.      fexofenadine (ALLEGRA) 180 MG tablet Take 180 mg by mouth daily as needed for allergies or rhinitis.     furosemide (LASIX) 40 MG tablet Take 1 tablet by mouth once daily 90 tablet 3   lisinopril (ZESTRIL) 20 MG tablet Take 1 tablet by mouth every morning.     losartan (COZAAR) 50 MG tablet Take 1 tablet (50 mg total) by mouth daily. 90 tablet 3   mirtazapine (REMERON) 15 MG tablet Take 1 tablet by mouth at bedtime.     montelukast (SINGULAIR) 10 MG tablet Take 10 mg by mouth as needed.   0   pravastatin (PRAVACHOL) 40 MG tablet Take 1 tablet (40 mg total) by mouth every evening. 90 tablet 3   vitamin B-12 (CYANOCOBALAMIN) 100 MCG tablet Take 1 tablet by mouth daily.     potassium chloride (KLOR-CON) 10 MEQ tablet Take 1 tablet by mouth once daily (Patient not taking: Reported on 04/22/2021) 90 tablet 0   No current facility-administered medications for this visit.    Allergies:   Codeine, Crestor [rosuvastatin],  Doxycycline, Ecotrin [aspirin], Lipitor [atorvastatin], Nsaids, Penicillins, Sulfa drugs cross reactors, Vitamin c, and Zocor [simvastatin]    Social History:  The patient  reports that she has never smoked. She has never used smokeless tobacco. She reports that she does not drink alcohol and does not use drugs.   Family History:  The patient's family history includes Aneurysm in her brother; Coronary artery disease in her mother; Heart attack (age of onset: 53) in her mother; Hypertension in her father.    ROS:  Please see the history of present illness.   Physical Exam: Blood pressure 138/72, pulse 74, height 5\' 2"  (1.575 m), weight 262 lb (118.8 kg), SpO2 94 %.  GEN:  morbidly obese female,  NAD  HEENT: Normal NECK: No JVD; No carotid bruits LYMPHATICS: No lymphadenopathy CARDIAC: RRR , no murmurs, rubs, gallops RESPIRATORY:  Clear to auscultation without rales, wheezing or rhonchi  ABDOMEN: Soft, non-tender, non-distended MUSCULOSKELETAL:  No edema; No deformity  SKIN: Warm and dry NEUROLOGIC:  Alert and oriented x 3     EKG:     Recent Labs: 10/19/2020: BUN 15; Creatinine, Ser 0.98; Potassium 4.4; Sodium 143 01/28/2021: ALT 14    Lipid Panel    Component Value Date/Time   CHOL 146 01/28/2021 1042   TRIG 91 01/28/2021 1042   HDL 44 01/28/2021 1042   CHOLHDL 3.3 01/28/2021 1042   CHOLHDL 3 11/10/2014 0926   VLDL 18.6 11/10/2014 0926   LDLCALC 85 01/28/2021 1042      Wt Readings from Last 3 Encounters:  04/22/21 262 lb (118.8 kg)  04/13/21 259 lb (117.5 kg)  10/19/20 240 lb (108.9 kg)      Other studies Reviewed: Additional studies/ records that were reviewed today include: . Review of the above records demonstrates:    ASSESSMENT AND PLAN:  1. Coronary artery disease-       2. Congestive heart failure-EF of 30-40% -     Her medication list that she is on both lisinopril and losartan.  We had discontinued her lisinopril when we tried 12/19/20.  She  might have started the lisinopril back automatically when she discontinued Entresto.  When she gets home she will call [employer] back and verify the med list compared to the actual medicines he is taking.  If she is not taking lisinopril then we will continue with losartan 50 mg a day.  If she is taking both lisinopril and losartan, will discontinue the lisinopril and double the losartan to 100 mg a day.    3. AICD placement -    Per EP   4. Hyperlipidemia -   lipids from Nov. Look ok .  LDL is 85. Encouraged weight loss  Recheck in a year ( or with [facility] )   5 obesity - encouraged weight loss   6.   Pre op assessment :    she needs a hysterectomy .   She is at low risk for her hysterectomy .   She may hold her ASA for 5-7 days prior to procedure   Current medicines are reviewed at length with the patient today.  The patient does not have concerns regarding medicines.  The following changes have been made:  no change   Disposition:       Follow up in 1 year with APP or me    Signed, Dec, MD  04/22/2021 11:07 AM    [facility] [address] [place], [place], [place]  [place] Phone: [phone]; Fax: [phone]

## 2021-04-26 ENCOUNTER — Telehealth: Payer: Self-pay

## 2021-04-26 NOTE — Telephone Encounter (Signed)
Spoke with the patient who states she is wanting to know if she needs to be seen. Her defibrillator went off yesterday and I have advised her that the electrophysiologist has been made aware and we will call back with his recommendations.  Patient states that she is having swelling in her feet. She does report eating poorly recently. She otherwise feels fine. Advised to elevate her feet and avoid salt. Patient verbalized understanding.

## 2021-04-26 NOTE — Telephone Encounter (Signed)
Patient called in stating she thinks she needs to be seen. She states she has fluid build up and is concerned something is wrong. Patient did get a shock which the device clinic has already addressed. Patient wants a appointment to see [name]

## 2021-04-26 NOTE — Telephone Encounter (Signed)
Pt daughter, Otila Kluver (DPR on file) calling with concerns about what the follow-up will be from the ICD shock patient had.    Advised that message has been sent to MD covering this week for review.  Since patient is asymptomatic now, I anticipate the result will be continued monitoring.   Pt daughter indicated concern that patient's face is still swollen.  She indicates it has been that was for a couple of weeks now.  Advised that is likely unrelated to cardiac uissue, patient should continue to f/u with PCP who placed her on prednisone for URI.

## 2021-04-26 NOTE — Telephone Encounter (Signed)
MDT alert for HV therapy Event occurred 2/5 @ 16:05, EGM shows VF, mean HR 400, HV therapy delivered 25J slowing to VS 80-120, duration 17sec.  Patient reports she was in her bedroom, leaned over the bed to clean her monitor, when she stood up left off balance and dizzy. Reports symptom was short in duration and asymptomatic since. Reports compliance with medications on file including Lasix 25 mg 1 1/2 BID, Lasix 40 mg daily.  Shock plan/[place] DMV driving restrictions reviewed with patient with verbal understanding.   Routing to Dr. Quentin Ore considering Dr. Lovena Le is unavailable at this time.

## 2021-04-27 ENCOUNTER — Emergency Department (HOSPITAL_COMMUNITY): Payer: Medicare Other

## 2021-04-27 ENCOUNTER — Telehealth: Payer: Self-pay | Admitting: Cardiovascular Disease

## 2021-04-27 ENCOUNTER — Observation Stay (HOSPITAL_COMMUNITY)
Admission: EM | Admit: 2021-04-27 | Discharge: 2021-04-29 | Disposition: A | Discharge status: Home / Self Care | Payer: Medicare Other | Attending: Internal Medicine | Admitting: Internal Medicine

## 2021-04-27 DIAGNOSIS — I2510 Atherosclerotic heart disease of native coronary artery without angina pectoris: Secondary | ICD-10-CM | POA: Insufficient documentation

## 2021-04-27 DIAGNOSIS — I110 Hypertensive heart disease with heart failure: Secondary | ICD-10-CM | POA: Diagnosis not present

## 2021-04-27 DIAGNOSIS — Z95810 Presence of automatic (implantable) cardiac defibrillator: Secondary | ICD-10-CM | POA: Diagnosis not present

## 2021-04-27 DIAGNOSIS — I5022 Chronic systolic (congestive) heart failure: Secondary | ICD-10-CM | POA: Diagnosis not present

## 2021-04-27 DIAGNOSIS — Z7901 Long term (current) use of anticoagulants: Secondary | ICD-10-CM | POA: Insufficient documentation

## 2021-04-27 DIAGNOSIS — Z7982 Long term (current) use of aspirin: Secondary | ICD-10-CM | POA: Insufficient documentation

## 2021-04-27 DIAGNOSIS — I252 Old myocardial infarction: Secondary | ICD-10-CM | POA: Diagnosis not present

## 2021-04-27 DIAGNOSIS — R42 Dizziness and giddiness: Secondary | ICD-10-CM

## 2021-04-27 DIAGNOSIS — I255 Ischemic cardiomyopathy: Secondary | ICD-10-CM | POA: Diagnosis not present

## 2021-04-27 DIAGNOSIS — I472 Ventricular tachycardia, unspecified: Secondary | ICD-10-CM

## 2021-04-27 DIAGNOSIS — I4901 Ventricular fibrillation: Principal | ICD-10-CM | POA: Insufficient documentation

## 2021-04-27 DIAGNOSIS — Z20822 Contact with and (suspected) exposure to covid-19: Secondary | ICD-10-CM | POA: Insufficient documentation

## 2021-04-27 DIAGNOSIS — I2583 Coronary atherosclerosis due to lipid rich plaque: Secondary | ICD-10-CM

## 2021-04-27 DIAGNOSIS — E785 Hyperlipidemia, unspecified: Secondary | ICD-10-CM

## 2021-04-27 LAB — URINALYSIS, ROUTINE W REFLEX MICROSCOPIC
Bilirubin Urine: NEGATIVE
Glucose, UA: NEGATIVE mg/dL
Hgb urine dipstick: NEGATIVE
Ketones, ur: NEGATIVE mg/dL
Leukocytes,Ua: NEGATIVE
Nitrite: NEGATIVE
Protein, ur: NEGATIVE mg/dL
Specific Gravity, Urine: 1.018 (ref 1.005–1.030)
pH: 6 (ref 5.0–8.0)

## 2021-04-27 LAB — CBC
HCT: 45.8 % (ref 36.0–46.0)
Hemoglobin: 14.5 g/dL (ref 12.0–15.0)
MCH: 30.5 pg (ref 26.0–34.0)
MCHC: 31.7 g/dL (ref 30.0–36.0)
MCV: 96.2 fL (ref 80.0–100.0)
Platelets: 190 10*3/uL (ref 150–400)
RBC: 4.76 MIL/uL (ref 3.87–5.11)
RDW: 13.7 % (ref 11.5–15.5)
WBC: 9.2 10*3/uL (ref 4.0–10.5)
nRBC: 0 % (ref 0.0–0.2)

## 2021-04-27 LAB — COMPREHENSIVE METABOLIC PANEL
ALT: 21 U/L (ref 0–44)
AST: 15 U/L (ref 15–41)
Albumin: 3.3 g/dL — ABNORMAL LOW (ref 3.5–5.0)
Alkaline Phosphatase: 53 U/L (ref 38–126)
Anion gap: 8 (ref 5–15)
BUN: 17 mg/dL (ref 8–23)
CO2: 31 mmol/L (ref 22–32)
Calcium: 8.6 mg/dL — ABNORMAL LOW (ref 8.9–10.3)
Chloride: 99 mmol/L (ref 98–111)
Creatinine, Ser: 0.99 mg/dL (ref 0.44–1.00)
GFR, Estimated: >60 mL/min (ref >60)
Glucose, Bld: 100 mg/dL — ABNORMAL HIGH (ref 70–99)
Potassium: 3.8 mmol/L (ref 3.5–5.1)
Sodium: 138 mmol/L (ref 135–145)
Total Bilirubin: 0.9 mg/dL (ref 0.3–1.2)
Total Protein: 5.9 g/dL — ABNORMAL LOW (ref 6.5–8.1)

## 2021-04-27 LAB — BRAIN NATRIURETIC PEPTIDE: B Natriuretic Peptide: 60.9 pg/mL (ref 0.0–100.0)

## 2021-04-27 LAB — TROPONIN I (HIGH SENSITIVITY)
Troponin I (High Sensitivity): 22 ng/L — ABNORMAL HIGH (ref <18)
Troponin I (High Sensitivity): 21 ng/L — ABNORMAL HIGH (ref <18)

## 2021-04-27 MED ORDER — PRAVASTATIN SODIUM 40 MG PO TABS
40.0000 mg | ORAL_TABLET | Freq: Every evening | ORAL | Status: DC
  Administered 2021-04-27 – 2021-04-28 (×2): 40 mg via ORAL
  Filled 2021-04-27 (×2): qty 1

## 2021-04-27 MED ORDER — ENOXAPARIN SODIUM 40 MG/0.4ML IJ SOSY
40.0000 mg | PREFILLED_SYRINGE | INTRAMUSCULAR | Status: DC
  Administered 2021-04-27 – 2021-04-28 (×2): 40 mg via SUBCUTANEOUS
  Filled 2021-04-27 (×2): qty 0.4

## 2021-04-27 MED ORDER — VITAMIN D 25 MCG (1000 UNIT) PO TABS
2000.0000 [IU] | ORAL_TABLET | Freq: Every day | ORAL | Status: DC
  Administered 2021-04-28 – 2021-04-29 (×2): 2000 [IU] via ORAL
  Filled 2021-04-27 (×2): qty 2

## 2021-04-27 MED ORDER — SODIUM CHLORIDE 0.9% FLUSH: 3.0000 mL | INTRAVENOUS | Status: DC | PRN

## 2021-04-27 MED ORDER — MIRTAZAPINE 15 MG PO TABS
15.0000 mg | ORAL_TABLET | Freq: Every day | ORAL | Status: DC
  Administered 2021-04-27 – 2021-04-28 (×2): 15 mg via ORAL
  Filled 2021-04-27 (×3): qty 1

## 2021-04-27 MED ORDER — SODIUM CHLORIDE 0.9 % IV SOLN: 250.0000 mL | INTRAVENOUS | Status: DC | PRN

## 2021-04-27 MED ORDER — ASPIRIN EC 81 MG PO TBEC
81.0000 mg | DELAYED_RELEASE_TABLET | Freq: Every day | ORAL | Status: DC
  Administered 2021-04-28 – 2021-04-29 (×2): 81 mg via ORAL
  Filled 2021-04-27 (×2): qty 1

## 2021-04-27 MED ORDER — FUROSEMIDE 40 MG PO TABS
40.0000 mg | ORAL_TABLET | Freq: Every day | ORAL | Status: DC
  Administered 2021-04-28: 40 mg via ORAL
  Filled 2021-04-27: qty 2

## 2021-04-27 MED ORDER — CITALOPRAM HYDROBROMIDE 20 MG PO TABS
40.0000 mg | ORAL_TABLET | Freq: Every day | ORAL | Status: DC
  Administered 2021-04-28 – 2021-04-29 (×2): 40 mg via ORAL
  Filled 2021-04-27: qty 4
  Filled 2021-04-27: qty 2

## 2021-04-27 MED ORDER — AMIODARONE HCL IN DEXTROSE 360-4.14 MG/200ML-% IV SOLN
30.0000 mg/h | INTRAVENOUS | Status: AC
  Administered 2021-04-28: 30 mg/h via INTRAVENOUS
  Filled 2021-04-27 (×2): qty 200

## 2021-04-27 MED ORDER — LOSARTAN POTASSIUM 50 MG PO TABS
50.0000 mg | ORAL_TABLET | Freq: Every day | ORAL | Status: DC
  Administered 2021-04-28: 50 mg via ORAL
  Filled 2021-04-27: qty 1

## 2021-04-27 MED ORDER — CARVEDILOL 25 MG PO TABS
37.5000 mg | ORAL_TABLET | Freq: Two times a day (BID) | ORAL | Status: DC
  Administered 2021-04-28 (×2): 37.5 mg via ORAL
  Filled 2021-04-27 (×2): qty 1
  Filled 2021-04-27: qty 3

## 2021-04-27 MED ORDER — AMIODARONE LOAD VIA INFUSION
150.0000 mg | Freq: Once | INTRAVENOUS | Status: AC
Start: 2021-04-27 — End: 2021-04-27
  Administered 2021-04-27: 150 mg via INTRAVENOUS
  Filled 2021-04-27: qty 83.34

## 2021-04-27 MED ORDER — AMIODARONE HCL IN DEXTROSE 360-4.14 MG/200ML-% IV SOLN
60.0000 mg/h | INTRAVENOUS | Status: AC
  Administered 2021-04-27 (×2): 60 mg/h via INTRAVENOUS
  Filled 2021-04-27 (×2): qty 200

## 2021-04-27 MED ORDER — VITAMIN B-12 100 MCG PO TABS
100.0000 ug | ORAL_TABLET | Freq: Every day | ORAL | Status: DC
  Administered 2021-04-28 – 2021-04-29 (×2): 100 ug via ORAL
  Filled 2021-04-27 (×2): qty 1

## 2021-04-27 MED ORDER — SODIUM CHLORIDE 0.9% FLUSH
3.0000 mL | Freq: Two times a day (BID) | INTRAVENOUS | Status: DC
  Administered 2021-04-28 (×2): 3 mL via INTRAVENOUS

## 2021-04-27 NOTE — Telephone Encounter (Signed)
Pts daughter is wanting to notify us that her mom has been transported to the ER via Ambulance. Says pt is currently having dizzy spells... please advise

## 2021-04-27 NOTE — Telephone Encounter (Signed)
Will route to Dr. [name] to make him aware. 

## 2021-04-27 NOTE — ED Provider Triage Note (Signed)
Emergency Medicine Provider Triage Evaluation Note  [name] , a 71 y.o. female  was evaluated in triage.  Pt complains of dizziness and cold-like symptoms.  Patient states that she has had a sinus infection for about 2 weeks, and she feels "full in her head".  She was called by her cardiologist after it was noted that her defibrillator went off, she states that she did not feel this.  She presents with dizziness today, and she is unsure if it is related to her sinus infection/congestion or her defibrillator.  Review of Systems  Positive: Shortness of breath, nasal congestion, cough, dizziness Negative: Chest pain, abdominal pain, nausea, vomiting, fever  Physical Exam  BP (!) 141/73 (BP Location: Left Arm)    Pulse 73    Temp 98.2 F (36.8 C) (Oral)    Resp 16    SpO2 95%  Gen:   Awake, no distress   Resp:  Normal effort  MSK:   Moves extremities without difficulty  Other:    Medical Decision Making  Medically screening exam initiated at 9:14 AM.  Appropriate orders placed.  [name] was informed that the remainder of the evaluation will be completed by another provider, this initial triage assessment does not replace that evaluation, and the importance of remaining in the ED until their evaluation is complete.    Su Monks, [place] 04/27/21 [id number]

## 2021-04-27 NOTE — H&P (Signed)
Cardiology Admission History and Physical:   Patient ID: [name] MRN: [id number]; DOB: 1950/09/11   Admission date: 04/27/2021  PCP:  Deloris Ping, MD   [facility] Providers Cardiologist:  Kristeen Miss, MD        Chief Complaint:  Dizziness  Patient Profile:   [name] is a 71 y.o. female with ICM (EF = 35%) s/p single chamber MDT AICD, CAD and HLD who is being seen 04/27/2021 for the evaluation of dizziness in the setting of a recent ICD shock.  History of Present Illness:   Ms. [name] states that on 04/25/21 she was in her USOH when she was on the phone with her son and lost consciousness. She states that she was using the restroom when she felt lightheaded then "blacked out". She believes that this episode lasted for ~30 seconds before she regained consciousness. She denies falling to the ground or head trauma. She denies feeling her defibrillator go off. She reports that over the past 1-2 weeks she has ongoing URI symptoms with nasal congestion, post nasal drip and non-productive cough for which she completed a course of prednisone for. She has felt "lousy" since the onset of these symptoms and has intermittent lightheadedness. The next day she was contacted by her cardiologist who noted that her defibrillator had fired on 2/5 at 16:05 for VF at a mean HR of 400 bpm. Since this episode she denies having any more syncopal events, but she continues to experience intermittent dizziness/lightheadedness that occurs upon standing. She denies fevers, chills, palpitations, chest pain, PND, orthopnea, GI symptoms, or urinary changes. She endorses mild swelling of the ankles and face that has increased over the past few days. Given the recent ICD shock and her ongoing dizziness, she elected to come to the ER today for evaluation.   In the ED her VS were all WNL. Labs were notable for troponin 22, but flat on repeat. Other labs all WNL. CXR without infiltrates and EKG with NSR with  occasional PVCs. She is currently asymptomatic. She was started on an amiodarone IV infusion in the ED and admitted to cardiology for further care.    Past Medical History:  Diagnosis Date   AICD (automatic cardioverter/defibrillator) present 12/08/2014   CAD (coronary artery disease)    CHRONICALLY OCCLUDED LAD WITH A LARGE ANTERIOR WALL MI    CHF (congestive heart failure) (HCC)    EF 30-40%   Dyslipidemia    Hypertension    ICD (implantable cardiac defibrillator), single, in situ    [employer]   Ischemic cardiomyopathy    Myocardial infarction [facility])    ANTERIOR WALL    Past Surgical History:  Procedure Laterality Date   CARDIAC CATHETERIZATION  01/13/05, 06/29/99   CARDIAC DEFIBRILLATOR PLACEMENT  02/21/05   SINGLE/[employer]   EP IMPLANTABLE DEVICE N/A 11/14/2014   Procedure:  ICD Generator Changeout with new lead & capping old 6949 lead;  Surgeon: Marinus Maw, MD;  Location: [facility] INVASIVE CV LAB;  Service: Cardiovascular;  Laterality: N/A;   EP IMPLANTABLE DEVICE N/A 12/08/2014   Procedure: ICD Implant;  Surgeon: Marinus Maw, MD;  Location: [facility] INVASIVE CV LAB;  Service: Cardiovascular;  Laterality: N/A;     Medications Prior to Admission: Prior to Admission medications   Medication Sig Start Date End Date Taking? Authorizing Provider  aspirin EC 81 MG tablet Take 81 mg by mouth daily.    [provider]  carvedilol (COREG) 25 MG tablet TAKE 1 & 1/2 (  ONE & ONE-HALF) TABLETS BY MOUTH TWICE DAILY 03/29/21   [name], [name], MD  cetirizine (ZYRTEC) 10 MG tablet Take 10 mg by mouth daily as needed for allergies. Patient not taking: Reported on 04/22/2021    [provider]  Cholecalciferol (VITAMIN D) 2000 UNITS CAPS Take 1 capsule (2,000 Units total) by mouth daily. 09/10/13   [name], [name], MD  citalopram (CELEXA) 40 MG tablet Take 40 mg by mouth daily.  07/29/13   [provider]  fexofenadine (ALLEGRA) 180 MG tablet Take 180 mg by mouth daily as  needed for allergies or rhinitis.    [provider]  furosemide (LASIX) 40 MG tablet Take 1 tablet by mouth once daily 05/13/20   [name], [name], MD  losartan (COZAAR) 50 MG tablet Take 1 tablet (50 mg total) by mouth daily. 04/22/21 07/21/21  [name], [name], MD  mirtazapine (REMERON) 15 MG tablet Take 1 tablet by mouth at bedtime. 07/05/19   [provider]  montelukast (SINGULAIR) 10 MG tablet Take 10 mg by mouth as needed.  08/27/14   [provider]  potassium chloride (KLOR-CON) 10 MEQ tablet Take 1 tablet by mouth once daily Patient not taking: Reported on 04/22/2021 02/26/20   [name], [name], MD  pravastatin (PRAVACHOL) 40 MG tablet Take 1 tablet (40 mg total) by mouth every evening. 10/28/20   [name], [name], MD  vitamin B-12 (CYANOCOBALAMIN) 100 MCG tablet Take 1 tablet by mouth daily.    [provider]     Allergies:    Allergies  Allergen Reactions   Codeine Nausea And Vomiting   Crestor [Rosuvastatin] Other (See Comments)    Muscle aches   Doxycycline Nausea Only   Ecotrin [Aspirin] Other (See Comments)    BLEEDING ULCER - ONLY IN HIGH DOSES   Lipitor [Atorvastatin] Other (See Comments)    Muscle aches   Nsaids Other (See Comments)    BLEEDING ULCER   Penicillins Rash   Sulfa Drugs Cross Reactors Nausea And Vomiting   Vitamin C Other (See Comments)    Upsets stomach   Zocor [Simvastatin] Other (See Comments)    Muscle aches    Social History:   Social History   Socioeconomic History   Marital status: Married    Spouse name: Not on file   Number of children: Not on file   Years of education: Not on file   Highest education level: Not on file  Occupational History   Not on file  Tobacco Use   Smoking status: Never   Smokeless tobacco: Never  Substance and Sexual Activity   Alcohol use: No   Drug use: No   Sexual activity: Not on file  Other Topics Concern   Not on file  Social History Narrative   Not on file   Social  Determinants of Health   Financial Resource Strain: Not on file  Food Insecurity: Not on file  Transportation Needs: Not on file  Physical Activity: Not on file  Stress: Not on file  Social Connections: Not on file  Intimate Partner Violence: Not on file    Family History:   The patient's family history includes Aneurysm in her brother; Coronary artery disease in her mother; Heart attack (age of onset: 1070) in her mother; Hypertension in her father.    ROS:  Please see the history of present illness.  All other ROS reviewed and negative.     Physical Exam/Data:   Vitals:   04/27/21  1737 04/27/21 1900 04/27/21 1915 04/27/21 2030  BP: (!) 119/53 139/73 138/70 119/75  Pulse: 78 87 81 82  Resp: 16 16 19 18   Temp:      TempSrc:      SpO2: 95% 95% 96% 96%    Intake/Output Summary (Last 24 hours) at 04/27/2021 2148 Last data filed at 04/27/2021 2035 Gross per 24 hour  Intake --  Output 250 ml  Net -250 ml   Last 3 Weights 04/22/2021 04/13/2021 10/19/2020  Weight (lbs) 262 lb 259 lb 240 lb  Weight (kg) 118.842 kg 117.482 kg 108.863 kg     There is no height or weight on file to calculate BMI.  General:  Well nourished, well developed, in no acute distress HEENT: normal, atraumatic, normocephalic Neck: no JVD Vascular: Distal pulses 2+ bilaterally   Cardiac:  normal S1, S2; RRR; no murmur  Lungs:  clear to auscultation bilaterally, no wheezing, rhonchi or rales  Abd: soft, nontender, no hepatomegaly  Ext: no edema, WWP Musculoskeletal:  No deformities, BUE and BLE strength normal and equal Skin: warm and dry  Neuro:  CNs 2-12 intact, no focal abnormalities noted Psych:  Normal affect    EKG:  The ECG that was done  was personally reviewed and demonstrates NSR, occasional PVCs, low voltage   Relevant CV Studies:  TTE 11/05/20:  IMPRESSIONS     1. WMA consistent with LAD infarction. EF unchanged ~35%. Left  ventricular ejection fraction, by estimation, is 35 to 40%. The  left  ventricle has moderately decreased function. The left ventricle  demonstrates regional wall motion abnormalities (see  scoring diagram/findings for description). Left ventricular diastolic  parameters are consistent with Grade I diastolic dysfunction (impaired  relaxation).   2. Right ventricular systolic function is normal. The right ventricular  size is normal. Tricuspid regurgitation signal is inadequate for assessing  PA pressure.   3. The mitral valve is grossly normal. Trivial mitral valve  regurgitation. No evidence of mitral stenosis.   4. The aortic valve is tricuspid. Aortic valve regurgitation is not  visualized. No aortic stenosis is present.   Laboratory Data:  High Sensitivity Troponin:   Recent Labs  Lab 04/27/21 1940  TROPONINIHS 22*      Chemistry Recent Labs  Lab 04/27/21 0925  NA 138  K 3.8  CL 99  CO2 31  GLUCOSE 100*  BUN 17  CREATININE 0.99  CALCIUM 8.6*  GFRNONAA >60  ANIONGAP 8    Recent Labs  Lab 04/27/21 0925  PROT 5.9*  ALBUMIN 3.3*  AST 15  ALT 21  ALKPHOS 53  BILITOT 0.9   Lipids No results for input(s): CHOL, TRIG, HDL, LABVLDL, LDLCALC, CHOLHDL in the last 168 hours. Hematology Recent Labs  Lab 04/27/21 0924  WBC 9.2  RBC 4.76  HGB 14.5  HCT 45.8  MCV 96.2  MCH 30.5  MCHC 31.7  RDW 13.7  PLT 190   Thyroid No results for input(s): TSH, FREET4 in the last 168 hours. BNP Recent Labs  Lab 04/27/21 1940  BNP 60.9    DDimer No results for input(s): DDIMER in the last 168 hours.   Radiology/Studies:  DG Chest 2 View  Result Date: 04/27/2021 CLINICAL DATA:  Shortness of breath, syncope EXAM: CHEST - 2 VIEW COMPARISON:  Previous studies including the examination of 12/09/2014 FINDINGS: Cardiac size is within normal limits. Lung fields are clear of any infiltrates or pulmonary edema. There is no pleural effusion or pneumothorax. Pacemaker/defibrillator battery is seen  in both infraclavicular regions. IMPRESSION:  There are no signs of pulmonary edema or focal pulmonary infiltrates. Electronically Signed   By: Ernie Avena M.D.   On: 04/27/2021 10:01     Assessment and Plan:   [name] is a 71 y.o. female with ICM (EF = 35%) s/p single chamber MDT AICD, CAD and HLD who is being seen 04/27/2021 for the evaluation of dizziness and VF.  #VF/VT ::Experienced a syncopal episode in the setting of VF with an associated defibrillation. No prior history of ICD shocks per her report. Device interrogation shows one episode of VF with a CL of 150 that was successfully terminated with 1 shock. Also had one short run of NSVT in January. This is almost certainly scar mediated VF given her history of ICM. She does not report having any current symptoms of ischemia and clinically she does not have decompensated CHF. Troponins are flat. I agree with the decision to start amiodarone for VF/VT treatment. Will also check mag and TSH levels. Can likely transition to an oral load of amiodarone if stable overnight. Will speak with EP tomorrow. -Continue amiodarone load with likely transition to oral load tomorrow -Consult EP in AM -check TSH and Mg -Recent TTE in August 2022 unchanged from prior -Will need baseline PFTs as outpatient since starting amiodarone -Consider repeat ischemic eval if arrhythmia recurs  #Dizziness/Lightheadedness ::Ongoing symptoms of dizziness upon standing most concerning for orthostatic hypotension. Episode of dizziness preceding ICD shock was obviously 2/2 VF, but her subsequent symptoms cannot be attributed to this. Will check orthostatic VS. If orthostatic may need increased water intake. -check orthostatic VS  #ICM (EF = 35%), NYHA class I ::Well compensated on exam. -continue home coreg, lasix, and losartan  #CAD #HLD -continue home ASA 81 mg daily -continue home pravastatin    Risk Assessment/Risk Scores:       New York Heart Association (NYHA) Functional Class NYHA  Class I     Severity of Illness: The appropriate patient status for this patient is OBSERVATION. Observation status is judged to be reasonable and necessary in order to provide the required intensity of service to ensure the patient's safety. The patient's presenting symptoms, physical exam findings, and initial radiographic and laboratory data in the context of their medical condition is felt to place them at decreased risk for further clinical deterioration. Furthermore, it is anticipated that the patient will be medically stable for discharge from the hospital within 2 midnights of admission.    For questions or updates, please contact [facility] Please consult [email or web address] for contact info under     Signed, Karl Ito, MD  04/27/2021 9:48 PM

## 2021-04-27 NOTE — ED Triage Notes (Signed)
Pt. Stated, My defibrillator went off on Friday and I blacked out. But Ive had a sinus infection for 2 weeks. It started in my ear and then in my sinus. So Im not sure if its me being dizzy from my equilibrium or what

## 2021-04-27 NOTE — ED Provider Notes (Signed)
[name] EMERGENCY DEPARTMENT Provider Note   CSN: [id number] Arrival date & time: 04/27/21  [id number]     History  Chief Complaint  Patient presents with   Dizziness    [name] is a 71 y.o. female with history of CHF, CAD with ICD ([employer]), MI, dyslipidemia and hypertension who presents to the ED for evaluation of persistent dizziness that has been ongoing for several days.  Patient states that she was in bed 2 days ago and when she rolled over to the side she felt extremely lightheaded and dizzy.  She got up to go to the bathroom and while she was in the bathroom, she "blacked out".  No head injury. her defibrillator went off.  Per chart review, patient had a 15 run of v-tach before it was terminated. Since then, she has felt persistent dizziness, particularly upon standing, although her defibrillator has not gone off again. She also endorses feeling swollen in both of her legs and face. Pt has cardiology care established with Dr. Acie Fredrickson who advised watchful waiting after her shock. Pt believed that her dizziness symptoms were likely secondary to an ongoing sinus infection that she has been managing for the last two weeks. Patient denies chest pain, palpitations, SOB, abdominal pain, n/v/d.    Dizziness Associated symptoms: no headaches, no shortness of breath and no vomiting       Home Medications Prior to Admission medications   Medication Sig Start Date End Date Taking? Authorizing Provider  aspirin EC 81 MG tablet Take 81 mg by mouth daily.    [provider]  carvedilol (COREG) 25 MG tablet TAKE 1 & 1/2 (ONE & ONE-HALF) TABLETS BY MOUTH TWICE DAILY 03/29/21   [name], Wonda Cheng, MD  cetirizine (ZYRTEC) 10 MG tablet Take 10 mg by mouth daily as needed for allergies. Patient not taking: Reported on 04/22/2021    [provider]  Cholecalciferol (VITAMIN D) 2000 UNITS CAPS Take 1 capsule (2,000 Units total) by mouth daily. 09/10/13   [name],  Wonda Cheng, MD  citalopram (CELEXA) 40 MG tablet Take 40 mg by mouth daily.  07/29/13   [provider]  fexofenadine (ALLEGRA) 180 MG tablet Take 180 mg by mouth daily as needed for allergies or rhinitis.    [provider]  furosemide (LASIX) 40 MG tablet Take 1 tablet by mouth once daily 05/13/20   [name], Wonda Cheng, MD  losartan (COZAAR) 50 MG tablet Take 1 tablet (50 mg total) by mouth daily. 04/22/21 07/21/21  [name], Wonda Cheng, MD  mirtazapine (REMERON) 15 MG tablet Take 1 tablet by mouth at bedtime. 07/05/19   [provider]  montelukast (SINGULAIR) 10 MG tablet Take 10 mg by mouth as needed.  08/27/14   [provider]  potassium chloride (KLOR-CON) 10 MEQ tablet Take 1 tablet by mouth once daily Patient not taking: Reported on 04/22/2021 02/26/20   [name], Wonda Cheng, MD  pravastatin (PRAVACHOL) 40 MG tablet Take 1 tablet (40 mg total) by mouth every evening. 10/28/20   [name], Wonda Cheng, MD  vitamin B-12 (CYANOCOBALAMIN) 100 MCG tablet Take 1 tablet by mouth daily.    [provider]      Allergies    Codeine, Crestor [rosuvastatin], Doxycycline, Ecotrin [aspirin], Lipitor [atorvastatin], Nsaids, Penicillins, Sulfa drugs cross reactors, Vitamin c, and Zocor [simvastatin]    Review of Systems   Review of Systems  Constitutional:  Negative for fever.  HENT:  Positive for sinus pain.   Eyes:  Negative.   Respiratory:  Negative for shortness of breath.   Cardiovascular: Negative.   Gastrointestinal:  Negative for abdominal pain and vomiting.  Endocrine: Negative.   Genitourinary: Negative.   Musculoskeletal: Negative.   Skin:  Negative for rash.  Neurological:  Positive for dizziness. Negative for headaches.  All other systems reviewed and are negative.  Physical Exam Updated Vital Signs BP 119/75    Pulse 82    Temp (!) 97.4 F (36.3 C) (Oral)    Resp 18    SpO2 96%  Physical Exam Vitals and nursing note reviewed.  Constitutional:      General:  She is not in acute distress.    Appearance: She is not ill-appearing.  HENT:     Head: Atraumatic.  Eyes:     Conjunctiva/sclera: Conjunctivae normal.  Cardiovascular:     Rate and Rhythm: Normal rate and regular rhythm.     Pulses: Normal pulses.     Heart sounds: No murmur heard. Pulmonary:     Effort: Pulmonary effort is normal. No respiratory distress.     Breath sounds: Normal breath sounds.  Abdominal:     General: Abdomen is flat. There is no distension.     Palpations: Abdomen is soft.     Tenderness: There is no abdominal tenderness.  Musculoskeletal:        General: Normal range of motion.     Cervical back: Normal range of motion.     Right lower leg: 1+ Pitting Edema present.     Left lower leg: 1+ Pitting Edema present.  Skin:    General: Skin is warm and dry.     Capillary Refill: Capillary refill takes less than 2 seconds.  Neurological:     General: No focal deficit present.     Mental Status: She is alert.     Comments: Speech is clear, able to follow commands CN III-XII intact Normal strength in upper and lower extremities bilaterally including dorsiflexion and plantar flexion, strong and equal grip strength Sensation normal to light and sharp touch Moves extremities without ataxia, coordination intact Normal finger to nose and rapid alternating movements No pronator drift    Psychiatric:        Mood and Affect: Mood normal.    ED Results / Procedures / Treatments   Labs (all labs ordered are listed, but only abnormal results are displayed) Labs Reviewed  URINALYSIS, ROUTINE W REFLEX MICROSCOPIC - Abnormal; Notable for the following components:      Result Value   APPearance HAZY (*)    All other components within normal limits  COMPREHENSIVE METABOLIC PANEL - Abnormal; Notable for the following components:   Glucose, Bld 100 (*)    Calcium 8.6 (*)    Total Protein 5.9 (*)    Albumin 3.3 (*)    All other components within normal limits   TROPONIN I (HIGH SENSITIVITY) - Abnormal; Notable for the following components:   Troponin I (High Sensitivity) 22 (*)    All other components within normal limits  CBC  BRAIN NATRIURETIC PEPTIDE  CBG MONITORING, ED    EKG None  Radiology DG Chest 2 View  Result Date: 04/27/2021 CLINICAL DATA:  Shortness of breath, syncope EXAM: CHEST - 2 VIEW COMPARISON:  Previous studies including the examination of 12/09/2014 FINDINGS: Cardiac size is within normal limits. Lung fields are clear of any infiltrates or pulmonary edema. There is no pleural effusion or pneumothorax. Pacemaker/defibrillator battery is seen in both infraclavicular regions. IMPRESSION:  There are no signs of pulmonary edema or focal pulmonary infiltrates. Electronically Signed   By: Elmer Picker M.D.   On: 04/27/2021 10:01    Procedures Procedures    Medications Ordered in ED Medications  amiodarone (NEXTERONE) 1.8 mg/mL load via infusion 150 mg (150 mg Intravenous Bolus from Bag 04/27/21 2026)    Followed by  amiodarone (NEXTERONE PREMIX) 360-4.14 MG/200ML-% (1.8 mg/mL) IV infusion (60 mg/hr Intravenous New Bag/Given 04/27/21 2027)    Followed by  amiodarone (NEXTERONE PREMIX) 360-4.14 MG/200ML-% (1.8 mg/mL) IV infusion (has no administration in time range)    ED Course/ Medical Decision Making/ A&P                           Medical Decision Making Amount and/or Complexity of Data Reviewed Labs: ordered.  Risk Prescription drug management.   History:  Per HPI  Initial impression:  This patient presents to the ED for concern of dizziness, this involves an extensive number of treatment options, and is a complaint that carries with it a high risk of complications and morbidity.   Differentials include cerebellar stroke, dehydration, UTI, arrhythmia, vertigo, BPPV  ED Course: Patient is well-appearing, nontoxic-appearing on exam.  No focal deficits.  There is mild bilateral lower extremity edema with  palpable distal pulses.  She continually endorses this "head swimming" sensation.  She has mild sinus pressure to palpation.  On chart review, it was concerning that she had a run of 15 and V. tach prior to termination.  Since she has had persistent dizziness, we will interrogate her defibrillator to assess whether she has been having intermittent runs of V. tach without shock.  Requested RN to interrogate her [employer] device, however there was a slight delay given some complications with the company.  I consulted with my attending, Dr. Darl Householder, who recommends we start amiodarone drip on patient while we consult with cardiology. I consulted with Dr. Conley Canal with cardiology who agrees with the plan of amiodarone drip and agrees to admit patient to monitored drip while awaiting [employer] interrogation.  Vital signs remained stable.  Lab Tests and EKG:  I Ordered, reviewed, and interpreted labs and EKG.  The pertinent results include:  CMP unremarkable UA without infection CBC without anemia or leukocytosis BNP normal Delta troponin mildly elevated without upward trend   Imaging Studies ordered:  I ordered imaging studies including chest x-ray without acute abnormality I independently visualized and interpreted imaging and I agree with the radiologist interpretation.    Cardiac Monitoring:  The patient was maintained on a cardiac monitor.  I personally viewed and interpreted the cardiac monitored which showed an underlying rhythm of: NSR   Medicines ordered and prescription drug management:  I ordered medication including: Amiodarone drip IV Reevaluation of the patient after these medicines showed that the patient stayed the same I have reviewed the patients home medicines and have made adjustments as needed   Consultations Obtained:  I requested consultation with Dr. Conley Canal in cardiology,  and discussed lab and imaging findings as well as pertinent plan - they recommend: Continue  amiodarone drip and they will admit patient to cardiology.  Disposition:  After consideration of the diagnostic results, physical exam, history and the patients response to treatment feel that the patent would benefit from admission with telemetry observation.   Dizziness with recent runs of V. Tach: Patient will continue amiodarone drip while interrogation of device is performed.  Cardiology will admit patient determine  ultimate treatment and disposition.  Final Clinical Impression(s) / ED Diagnoses Final diagnoses:  Dizziness    Rx / DC Orders ED Discharge Orders     None         Rodena Piety 04/27/21 2242    Drenda Freeze, MD 04/29/21 1330

## 2021-04-27 NOTE — ED Notes (Signed)
Provider at bedside

## 2021-04-28 ENCOUNTER — Observation Stay (HOSPITAL_BASED_OUTPATIENT_CLINIC_OR_DEPARTMENT_OTHER): Payer: Medicare Other

## 2021-04-28 ENCOUNTER — Other Ambulatory Visit: Payer: Self-pay

## 2021-04-28 ENCOUNTER — Encounter (HOSPITAL_COMMUNITY)
Admission: EM | Disposition: A | Discharge status: Home / Self Care | Payer: Self-pay | Source: Home / Self Care | Attending: Emergency Medicine

## 2021-04-28 ENCOUNTER — Encounter (HOSPITAL_COMMUNITY): Payer: Self-pay | Admitting: Student in an Organized Health Care Education/Training Program

## 2021-04-28 DIAGNOSIS — I472 Ventricular tachycardia, unspecified: Secondary | ICD-10-CM

## 2021-04-28 DIAGNOSIS — Z95810 Presence of automatic (implantable) cardiac defibrillator: Secondary | ICD-10-CM

## 2021-04-28 DIAGNOSIS — E782 Mixed hyperlipidemia: Secondary | ICD-10-CM | POA: Diagnosis not present

## 2021-04-28 DIAGNOSIS — I5042 Chronic combined systolic (congestive) and diastolic (congestive) heart failure: Secondary | ICD-10-CM | POA: Diagnosis not present

## 2021-04-28 DIAGNOSIS — I4901 Ventricular fibrillation: Secondary | ICD-10-CM

## 2021-04-28 DIAGNOSIS — I2510 Atherosclerotic heart disease of native coronary artery without angina pectoris: Secondary | ICD-10-CM

## 2021-04-28 HISTORY — PX: LEFT HEART CATH AND CORONARY ANGIOGRAPHY: CATH118249

## 2021-04-28 LAB — RESP PANEL BY RT-PCR (FLU A&B, COVID) ARPGX2
Influenza A by PCR: NEGATIVE
Influenza B by PCR: NEGATIVE
SARS Coronavirus 2 by RT PCR: NEGATIVE

## 2021-04-28 LAB — ECHOCARDIOGRAM COMPLETE
Area-P 1/2: 3.7 cm2
S' Lateral: 3.8 cm

## 2021-04-28 LAB — TSH: TSH: 3.062 u[IU]/mL (ref 0.350–4.500)

## 2021-04-28 LAB — MAGNESIUM: Magnesium: 2 mg/dL (ref 1.7–2.4)

## 2021-04-28 LAB — SARS CORONAVIRUS 2 (TAT 6-24 HRS): SARS Coronavirus 2: NEGATIVE

## 2021-04-28 SURGERY — LEFT HEART CATH AND CORONARY ANGIOGRAPHY
Anesthesia: LOCAL

## 2021-04-28 MED ORDER — VERAPAMIL HCL 2.5 MG/ML IV SOLN
INTRAVENOUS | Status: AC
  Filled 2021-04-28: qty 2

## 2021-04-28 MED ORDER — SODIUM CHLORIDE 0.9 % IV SOLN: INTRAVENOUS | Status: AC

## 2021-04-28 MED ORDER — SODIUM CHLORIDE 0.9% FLUSH
3.0000 mL | Freq: Two times a day (BID) | INTRAVENOUS | Status: DC
  Administered 2021-04-28: 3 mL via INTRAVENOUS

## 2021-04-28 MED ORDER — SODIUM CHLORIDE 0.9 % IV SOLN: 250.0000 mL | INTRAVENOUS | Status: DC | PRN

## 2021-04-28 MED ORDER — FENTANYL CITRATE (PF) 100 MCG/2ML IJ SOLN
INTRAMUSCULAR | Status: DC | PRN
  Administered 2021-04-28 (×2): 25 ug via INTRAVENOUS

## 2021-04-28 MED ORDER — HEPARIN (PORCINE) IN NACL 1000-0.9 UT/500ML-% IV SOLN
INTRAVENOUS | Status: AC
  Filled 2021-04-28: qty 500

## 2021-04-28 MED ORDER — ACETAMINOPHEN 325 MG PO TABS: 650.0000 mg | ORAL_TABLET | ORAL | Status: DC | PRN

## 2021-04-28 MED ORDER — VERAPAMIL HCL 2.5 MG/ML IV SOLN
INTRAVENOUS | Status: DC | PRN
  Administered 2021-04-28: 10 mL via INTRA_ARTERIAL

## 2021-04-28 MED ORDER — ONDANSETRON HCL 4 MG/2ML IJ SOLN: 4.0000 mg | Freq: Four times a day (QID) | INTRAMUSCULAR | Status: DC | PRN

## 2021-04-28 MED ORDER — MIDAZOLAM HCL 2 MG/2ML IJ SOLN
INTRAMUSCULAR | Status: AC
  Filled 2021-04-28: qty 2

## 2021-04-28 MED ORDER — SODIUM CHLORIDE 0.9% FLUSH: 3.0000 mL | Freq: Two times a day (BID) | INTRAVENOUS | Status: DC

## 2021-04-28 MED ORDER — LIDOCAINE HCL (PF) 1 % IJ SOLN
INTRAMUSCULAR | Status: AC
  Filled 2021-04-28: qty 30

## 2021-04-28 MED ORDER — HEPARIN SODIUM (PORCINE) 1000 UNIT/ML IJ SOLN
INTRAMUSCULAR | Status: AC
  Filled 2021-04-28: qty 10

## 2021-04-28 MED ORDER — LABETALOL HCL 5 MG/ML IV SOLN: 10.0000 mg | INTRAVENOUS | Status: AC | PRN

## 2021-04-28 MED ORDER — SODIUM CHLORIDE 0.9% FLUSH: 3.0000 mL | INTRAVENOUS | Status: DC | PRN

## 2021-04-28 MED ORDER — AMIODARONE HCL 200 MG PO TABS
400.0000 mg | ORAL_TABLET | Freq: Two times a day (BID) | ORAL | Status: DC
  Administered 2021-04-28 – 2021-04-29 (×2): 400 mg via ORAL
  Filled 2021-04-28 (×2): qty 2

## 2021-04-28 MED ORDER — EZETIMIBE 10 MG PO TABS
10.0000 mg | ORAL_TABLET | Freq: Every day | ORAL | Status: DC
  Administered 2021-04-28 – 2021-04-29 (×2): 10 mg via ORAL
  Filled 2021-04-28 (×2): qty 1

## 2021-04-28 MED ORDER — PERFLUTREN LIPID MICROSPHERE
1.0000 mL | INTRAVENOUS | Status: AC | PRN
  Administered 2021-04-28: 3 mL via INTRAVENOUS
  Filled 2021-04-28: qty 10

## 2021-04-28 MED ORDER — ZOLPIDEM TARTRATE 5 MG PO TABS
5.0000 mg | ORAL_TABLET | Freq: Once | ORAL | Status: AC
  Administered 2021-04-28: 5 mg via ORAL
  Filled 2021-04-28: qty 1

## 2021-04-28 MED ORDER — MIDAZOLAM HCL 2 MG/2ML IJ SOLN
INTRAMUSCULAR | Status: DC | PRN
  Administered 2021-04-28 (×2): 1 mg via INTRAVENOUS

## 2021-04-28 MED ORDER — FENTANYL CITRATE (PF) 100 MCG/2ML IJ SOLN
INTRAMUSCULAR | Status: AC
  Filled 2021-04-28: qty 2

## 2021-04-28 MED ORDER — SODIUM CHLORIDE 0.9 % IV SOLN: INTRAVENOUS | Status: DC

## 2021-04-28 MED ORDER — HEPARIN SODIUM (PORCINE) 1000 UNIT/ML IJ SOLN
INTRAMUSCULAR | Status: DC | PRN
  Administered 2021-04-28: 5000 [IU] via INTRAVENOUS

## 2021-04-28 MED ORDER — IOHEXOL 350 MG/ML SOLN
INTRAVENOUS | Status: DC | PRN
  Administered 2021-04-28: 35 mL

## 2021-04-28 MED ORDER — LIDOCAINE HCL (PF) 1 % IJ SOLN
INTRAMUSCULAR | Status: DC | PRN
  Administered 2021-04-28: 2 mL

## 2021-04-28 MED ORDER — HYDRALAZINE HCL 20 MG/ML IJ SOLN: 10.0000 mg | INTRAMUSCULAR | Status: AC | PRN

## 2021-04-28 MED ORDER — HEPARIN (PORCINE) IN NACL 1000-0.9 UT/500ML-% IV SOLN
INTRAVENOUS | Status: DC | PRN
  Administered 2021-04-28 (×2): 500 mL

## 2021-04-28 SURGICAL SUPPLY — 11 items
CATH INFINITI 6F ANG MULTIPACK (CATHETERS) ×1 IMPLANT
CATH INFINITI 6F FL3.5 (CATHETERS) ×1 IMPLANT
DEVICE RAD COMP TR BAND LRG (VASCULAR PRODUCTS) ×1 IMPLANT
GLIDESHEATH SLEND SS 6F .021 (SHEATH) ×1 IMPLANT
GUIDEWIRE INQWIRE 1.5J.035X260 (WIRE) IMPLANT
INQWIRE 1.5J .035X260CM (WIRE) ×2
KIT HEART LEFT (KITS) ×2 IMPLANT
MAT PREVALON FULL STRYKER (MISCELLANEOUS) ×1 IMPLANT
PACK CARDIAC CATHETERIZATION (CUSTOM PROCEDURE TRAY) ×2 IMPLANT
TRANSDUCER W/STOPCOCK (MISCELLANEOUS) ×2 IMPLANT
TUBING CIL FLEX 10 FLL-RA (TUBING) ×2 IMPLANT

## 2021-04-28 NOTE — Progress Notes (Signed)
Echocardiogram 2D Echocardiogram has been performed.  Warren Lacy [name] RDCS 04/28/2021, 8:31 AM

## 2021-04-28 NOTE — Progress Notes (Signed)
Progress Note  Patient Name: [name] Date of Encounter: 04/28/2021  [facility] Cardiologist: Mertie Moores, MD    Subjective   71 year old female with a history of an anterior wall myocardial infarction, morbid obesity, chronic systolic heart failure-status post ICD  She was seen yesterday for further evaluation following an ICD shock.  The patient was just seen last week.  Over the last several days she has been having fatigue and just not feeling well.  We previously had her on Entresto but she was not able to afford it so reluctantly we started her on valsartan instead.  She has been seen by Dr. Quentin Ore.  She is on an amiodarone drip.  She is scheduled for heart catheterization today.  We discussed the risk, benefits, options of heart catheterization.  She understands and agrees to proceed.  Inpatient Medications    Scheduled Meds:  aspirin EC  81 mg Oral Daily   carvedilol  37.5 mg Oral BID WC   cholecalciferol  2,000 Units Oral Daily   citalopram  40 mg Oral Daily   enoxaparin (LOVENOX) injection  40 mg Subcutaneous Q24H   furosemide  40 mg Oral Daily   losartan  50 mg Oral Daily   mirtazapine  15 mg Oral QHS   pravastatin  40 mg Oral QPM   sodium chloride flush  3 mL Intravenous Q12H   vitamin B-12  100 mcg Oral Daily   Continuous Infusions:  sodium chloride     amiodarone 30 mg/hr (04/28/21 0151)   PRN Meds: sodium chloride, perflutren lipid microspheres (DEFINITY) IV suspension, sodium chloride flush   Vital Signs    Vitals:   04/28/21 0715 04/28/21 0715 04/28/21 0830 04/28/21 0900  BP: 120/64  129/63 (!) 122/55  Pulse: 76  70 70  Resp: 14  16 17   Temp:  98.7 F (37.1 C)    TempSrc:  Oral    SpO2: 94%  97% 93%    Intake/Output Summary (Last 24 hours) at 04/28/2021 0933 Last data filed at 04/27/2021 2035 Gross per 24 hour  Intake --  Output 250 ml  Net -250 ml   Last 3 Weights 04/22/2021 04/13/2021 10/19/2020  Weight (lbs) 262 lb 259 lb 240 lb   Weight (kg) 118.842 kg 117.482 kg 108.863 kg      Telemetry    NSR  - Personally Reviewed  ECG     - Personally Reviewed  Physical Exam   GEN: Morbidly obese, middle-aged female, no acute distress Neck: No JVD Cardiac: RRR, no murmurs, rubs, or gallops.  Respiratory: Clear to auscultation bilaterally. GI: Soft, nontender, non-distended  MS: No edema; No deformity. Neuro:  Nonfocal  Psych: Normal affect   Labs    High Sensitivity Troponin:   Recent Labs  Lab 04/27/21 1940 04/27/21 2122  TROPONINIHS 22* 21*     Chemistry Recent Labs  Lab 04/27/21 0925 04/28/21 0602  NA 138  --   K 3.8  --   CL 99  --   CO2 31  --   GLUCOSE 100*  --   BUN 17  --   CREATININE 0.99  --   CALCIUM 8.6*  --   MG  --  2.0  PROT 5.9*  --   ALBUMIN 3.3*  --   AST 15  --   ALT 21  --   ALKPHOS 53  --   BILITOT 0.9  --   GFRNONAA >60  --   ANIONGAP 8  --  Lipids No results for input(s): CHOL, TRIG, HDL, LABVLDL, LDLCALC, CHOLHDL in the last 168 hours.  Hematology Recent Labs  Lab 04/27/21 0924  WBC 9.2  RBC 4.76  HGB 14.5  HCT 45.8  MCV 96.2  MCH 30.5  MCHC 31.7  RDW 13.7  PLT 190   Thyroid  Recent Labs  Lab 04/28/21 0602  TSH 3.062    BNP Recent Labs  Lab 04/27/21 1940  BNP 60.9    DDimer No results for input(s): DDIMER in the last 168 hours.   Radiology    DG Chest 2 View  Result Date: 04/27/2021 CLINICAL DATA:  Shortness of breath, syncope EXAM: CHEST - 2 VIEW COMPARISON:  Previous studies including the examination of 12/09/2014 FINDINGS: Cardiac size is within normal limits. Lung fields are clear of any infiltrates or pulmonary edema. There is no pleural effusion or pneumothorax. Pacemaker/defibrillator battery is seen in both infraclavicular regions. IMPRESSION: There are no signs of pulmonary edema or focal pulmonary infiltrates. Electronically Signed   By: Elmer Picker M.D.   On: 04/27/2021 10:01    Cardiac Studies     Patient Profile      71 y.o. female   [place]    1.  Ventricular tachycardia/ventricular fibrillation.  The interrogation of the ICD reveals that she had a true episode of rapid ventricular tachycardia.  She was appropriately defibrillated.  She has not been feeling well for the past several days.  I think that she needs an ischemic evaluation.  I agree with the plan for heart catheterization.  We have discussed the risk, event benefits, options of heart catheterization.  She understands and agrees to proceed.  We will continue IV amiodarone loading.  She will be discharged on amiodarone once she is stable.  2.  Coronary artery disease: She has a history of a remote anterior wall myocardial infarction.  For catheterization today.  Her last LDL was 85.  She is on pravastatin.   Is intolerant to rosuvastatin, atorvastatin, simvastatin.  She will need referral to the lipid clinic for consideration for PCSK9 inhibitor or Inclisiran.  Will add zetia for now    2.  Chronic combined systolic and diastolic congestive heart failure: She was previously on Entresto but could not afford it.  We switched her to valsartan since she was not able to afford it.  I have strongly advised her to go back to [place] as I think it is a better medication.    For questions or updates, please contact [place] Please consult [email or web address] for contact info under        Signed, Mertie Moores, MD  04/28/2021, 9:33 AM

## 2021-04-28 NOTE — Interval H&P Note (Signed)
History and Physical Interval Note:  04/28/2021 1:37 PM  [name]  has presented today for surgery, with the diagnosis of vt.  The various methods of treatment have been discussed with the patient and family. After consideration of risks, benefits and other options for treatment, the patient has consented to  Procedure(s): LEFT HEART CATH AND CORONARY ANGIOGRAPHY (N/A) as a surgical intervention.  The patient's history has been reviewed, patient examined, no change in status, stable for surgery.  I have reviewed the patient's chart and labs.  Questions were answered to the patient's satisfaction.    Cath Lab Visit (complete for each Cath Lab visit)  Clinical Evaluation Leading to the Procedure:   ACS: No.  Non-ACS:    Anginal Classification: No Symptoms  Anti-ischemic medical therapy: Minimal Therapy (1 class of medications)  Non-Invasive Test Results: No non-invasive testing performed  Prior CABG: No previous CABG         Orbie Pyo

## 2021-04-28 NOTE — H&P (View-Only) (Signed)
Progress Note  Patient Name: [name] Date of Encounter: 04/28/2021  [facility] Cardiologist: Mertie Moores, MD    Subjective   71 year old female with a history of an anterior wall myocardial infarction, morbid obesity, chronic systolic heart failure-status post ICD  She was seen yesterday for further evaluation following an ICD shock.  The patient was just seen last week.  Over the last several days she has been having fatigue and just not feeling well.  We previously had her on Entresto but she was not able to afford it so reluctantly we started her on valsartan instead.  She has been seen by Dr. Quentin Ore.  She is on an amiodarone drip.  She is scheduled for heart catheterization today.  We discussed the risk, benefits, options of heart catheterization.  She understands and agrees to proceed.  Inpatient Medications    Scheduled Meds:  aspirin EC  81 mg Oral Daily   carvedilol  37.5 mg Oral BID WC   cholecalciferol  2,000 Units Oral Daily   citalopram  40 mg Oral Daily   enoxaparin (LOVENOX) injection  40 mg Subcutaneous Q24H   furosemide  40 mg Oral Daily   losartan  50 mg Oral Daily   mirtazapine  15 mg Oral QHS   pravastatin  40 mg Oral QPM   sodium chloride flush  3 mL Intravenous Q12H   vitamin B-12  100 mcg Oral Daily   Continuous Infusions:  sodium chloride     amiodarone 30 mg/hr (04/28/21 0151)   PRN Meds: sodium chloride, perflutren lipid microspheres (DEFINITY) IV suspension, sodium chloride flush   Vital Signs    Vitals:   04/28/21 0715 04/28/21 0715 04/28/21 0830 04/28/21 0900  BP: 120/64  129/63 (!) 122/55  Pulse: 76  70 70  Resp: 14  16 17   Temp:  98.7 F (37.1 C)    TempSrc:  Oral    SpO2: 94%  97% 93%    Intake/Output Summary (Last 24 hours) at 04/28/2021 0933 Last data filed at 04/27/2021 2035 Gross per 24 hour  Intake --  Output 250 ml  Net -250 ml   Last 3 Weights 04/22/2021 04/13/2021 10/19/2020  Weight (lbs) 262 lb 259 lb 240 lb   Weight (kg) 118.842 kg 117.482 kg 108.863 kg      Telemetry    NSR  - Personally Reviewed  ECG     - Personally Reviewed  Physical Exam   GEN: Morbidly obese, middle-aged female, no acute distress Neck: No JVD Cardiac: RRR, no murmurs, rubs, or gallops.  Respiratory: Clear to auscultation bilaterally. GI: Soft, nontender, non-distended  MS: No edema; No deformity. Neuro:  Nonfocal  Psych: Normal affect   Labs    High Sensitivity Troponin:   Recent Labs  Lab 04/27/21 1940 04/27/21 2122  TROPONINIHS 22* 21*     Chemistry Recent Labs  Lab 04/27/21 0925 04/28/21 0602  NA 138  --   K 3.8  --   CL 99  --   CO2 31  --   GLUCOSE 100*  --   BUN 17  --   CREATININE 0.99  --   CALCIUM 8.6*  --   MG  --  2.0  PROT 5.9*  --   ALBUMIN 3.3*  --   AST 15  --   ALT 21  --   ALKPHOS 53  --   BILITOT 0.9  --   GFRNONAA >60  --   ANIONGAP 8  --  Lipids No results for input(s): CHOL, TRIG, HDL, LABVLDL, LDLCALC, CHOLHDL in the last 168 hours.  Hematology Recent Labs  Lab 04/27/21 0924  WBC 9.2  RBC 4.76  HGB 14.5  HCT 45.8  MCV 96.2  MCH 30.5  MCHC 31.7  RDW 13.7  PLT 190   Thyroid  Recent Labs  Lab 04/28/21 0602  TSH 3.062    BNP Recent Labs  Lab 04/27/21 1940  BNP 60.9    DDimer No results for input(s): DDIMER in the last 168 hours.   Radiology    DG Chest 2 View  Result Date: 04/27/2021 CLINICAL DATA:  Shortness of breath, syncope EXAM: CHEST - 2 VIEW COMPARISON:  Previous studies including the examination of 12/09/2014 FINDINGS: Cardiac size is within normal limits. Lung fields are clear of any infiltrates or pulmonary edema. There is no pleural effusion or pneumothorax. Pacemaker/defibrillator battery is seen in both infraclavicular regions. IMPRESSION: There are no signs of pulmonary edema or focal pulmonary infiltrates. Electronically Signed   By: Elmer Picker M.D.   On: 04/27/2021 10:01    Cardiac Studies     Patient Profile      71 y.o. female   [place]    1.  Ventricular tachycardia/ventricular fibrillation.  The interrogation of the ICD reveals that she had a true episode of rapid ventricular tachycardia.  She was appropriately defibrillated.  She has not been feeling well for the past several days.  I think that she needs an ischemic evaluation.  I agree with the plan for heart catheterization.  We have discussed the risk, event benefits, options of heart catheterization.  She understands and agrees to proceed.  We will continue IV amiodarone loading.  She will be discharged on amiodarone once she is stable.  2.  Coronary artery disease: She has a history of a remote anterior wall myocardial infarction.  For catheterization today.  Her last LDL was 85.  She is on pravastatin.   Is intolerant to rosuvastatin, atorvastatin, simvastatin.  She will need referral to the lipid clinic for consideration for PCSK9 inhibitor or Inclisiran.  Will add zetia for now    2.  Chronic combined systolic and diastolic congestive heart failure: She was previously on Entresto but could not afford it.  We switched her to valsartan since she was not able to afford it.  I have strongly advised her to go back to [place] as I think it is a better medication.    For questions or updates, please contact [place] Please consult [email or web address] for contact info under        Signed, Mertie Moores, MD  04/28/2021, 9:33 AM

## 2021-04-28 NOTE — Progress Notes (Signed)
°  Transition of Care [facility]) Screening Note   Patient Details  Name: [name] Date of Birth: 1950/12/23   Transition of Care [facility] CM/SW Contact:    Milas Gain, [place] Phone Number: 04/28/2021, 4:06 PM    Transition of Care Department [facility]) has reviewed patient and no TOC needs have been identified at this time. We will continue to monitor patient advancement through interdisciplinary progression rounds. If new patient transition needs arise, please place a TOC consult.

## 2021-04-28 NOTE — Consult Note (Addendum)
ELECTROPHYSIOLOGY CONSULT NOTE    Patient ID: [name] MRN: [id number], DOB/AGE: [birth year] 71 y.o.  Admit date: 04/27/2021 Date of Consult: 04/28/2021  Primary Physician: Deloris Ping, MD Primary Cardiologist: Kristeen Miss, MD  Electrophysiologist: Dr. Ladona Ridgel  Referring Provider: Dr. Lendell Caprice  Patient Profile: [name] is a 71 y.o. female with a history of chronic systolic CHF s/p ICD, CAD, morbid obesity, who is being seen today for the evaluation of ICD shock at the request of Dr. Lendell Caprice.  HPI:  [name] is a 71 y.o. female with medical history as above.   Seen in EP clinic 04/13/2021 and was doing OK. She had gained weight back that she had previously lost. Denied chest pain or ICD therapies.   Seen by gen cards 04/22/2021. Was doing well overall. Previously failed entresto due to cost.   2/5 pt had appropriate therapy on her device.  She reports she was in her bedroom, leaning over her bed to clean her monitor when she felt off balance and dizzy.  Pt called clinic with concerns of facial swelling (not new) and peripheral edema.     She presented to the ED 04/27/2020 with continued URI symptoms and concern over her recent shock. Continues to have mild swelling of face and ankles. She denies fevers, chills, palpitations, chest pain, PND, orthopnea, GI symptoms, or urinary changes  She is feeling OK at rest currently. Denies further syncope. Just "doesn't feel good overall" since shock. Has been lightheaded with standing. She feels better being here in the hospital. Denies fevers or chills. Was on prednisone for URI. Denies use of any decongestants.  No recent chest pain.   Past Medical History:  Diagnosis Date   AICD (automatic cardioverter/defibrillator) present 12/08/2014   CAD (coronary artery disease)    CHRONICALLY OCCLUDED LAD WITH A LARGE ANTERIOR WALL MI    CHF (congestive heart failure) (HCC)    EF 30-40%   Dyslipidemia    Hypertension    ICD  (implantable cardiac defibrillator), single, in situ    MEDTRONIC   Ischemic cardiomyopathy    Myocardial infarction [facility])    ANTERIOR WALL     Surgical History:  Past Surgical History:  Procedure Laterality Date   CARDIAC CATHETERIZATION  01/13/05, 06/29/99   CARDIAC DEFIBRILLATOR PLACEMENT  02/21/05   SINGLE/MEDTRONIC   EP IMPLANTABLE DEVICE N/A 11/14/2014   Procedure:  ICD Generator Changeout with new lead & capping old 6949 lead;  Surgeon: Marinus Maw, MD;  Location: [facility] INVASIVE CV LAB;  Service: Cardiovascular;  Laterality: N/A;   EP IMPLANTABLE DEVICE N/A 12/08/2014   Procedure: ICD Implant;  Surgeon: Marinus Maw, MD;  Location: [facility] INVASIVE CV LAB;  Service: Cardiovascular;  Laterality: N/A;     (Not in a hospital admission)   Inpatient Medications:   aspirin EC  81 mg Oral Daily   carvedilol  37.5 mg Oral BID WC   cholecalciferol  2,000 Units Oral Daily   citalopram  40 mg Oral Daily   enoxaparin (LOVENOX) injection  40 mg Subcutaneous Q24H   furosemide  40 mg Oral Daily   losartan  50 mg Oral Daily   mirtazapine  15 mg Oral QHS   pravastatin  40 mg Oral QPM   sodium chloride flush  3 mL Intravenous Q12H   vitamin B-12  100 mcg Oral Daily    Allergies:  Allergies  Allergen Reactions   Codeine Nausea And Vomiting   Crestor [Rosuvastatin] Other (See  Comments)    Muscle aches   Doxycycline Nausea Only   Ecotrin [Aspirin] Other (See Comments)    BLEEDING ULCER - ONLY IN HIGH DOSES   Lipitor [Atorvastatin] Other (See Comments)    Muscle aches   Nsaids Other (See Comments)    BLEEDING ULCER   Penicillins Rash   Sulfa Drugs Cross Reactors Nausea And Vomiting   Vitamin C Other (See Comments)    Upsets stomach   Zocor [Simvastatin] Other (See Comments)    Muscle aches    Social History   Socioeconomic History   Marital status: Married    Spouse name: Not on file   Number of children: Not on file   Years of education: Not on file   Highest education  level: Not on file  Occupational History   Not on file  Tobacco Use   Smoking status: Never   Smokeless tobacco: Never  Substance and Sexual Activity   Alcohol use: No   Drug use: No   Sexual activity: Not on file  Other Topics Concern   Not on file  Social History Narrative   Not on file   Social Determinants of Health   Financial Resource Strain: Not on file  Food Insecurity: Not on file  Transportation Needs: Not on file  Physical Activity: Not on file  Stress: Not on file  Social Connections: Not on file  Intimate Partner Violence: Not on file     Family History  Problem Relation Age of Onset   Heart attack Mother 25   Coronary artery disease Mother    Aneurysm Brother    Hypertension Father      Review of Systems: All other systems reviewed and are otherwise negative except as noted above.  Physical Exam: Vitals:   04/27/21 2230 04/28/21 0015 04/28/21 0205 04/28/21 0400  BP: (!) 124/58 (!) 121/52 113/60 115/65  Pulse: 75 71 70 70  Resp: 17 (!) 21 13 16   Temp:      TempSrc:      SpO2: 95% 90% 93% 93%    GEN- The patient is well appearing, alert and oriented x 3 today.   HEENT: normocephalic, atraumatic; sclera clear, conjunctiva pink; hearing intact; oropharynx clear; neck supple Lungs- Clear to ausculation bilaterally, normal work of breathing.  No wheezes, rales, rhonchi Heart- Regular rate and rhythm, no murmurs, rubs or gallops GI- soft, non-tender, non-distended, bowel sounds present Extremities- no clubbing, cyanosis, or edema; DP/PT/radial pulses 2+ bilaterally MS- no significant deformity or atrophy Skin- warm and dry, no rash or lesion Psych- euthymic mood, full affect Neuro- strength and sensation are intact  Labs:   Lab Results  Component Value Date   WBC 9.2 04/27/2021   HGB 14.5 04/27/2021   HCT 45.8 04/27/2021   MCV 96.2 04/27/2021   PLT 190 04/27/2021    Recent Labs  Lab 04/27/21 0925  NA 138  K 3.8  CL 99  CO2 31  BUN 17   CREATININE 0.99  CALCIUM 8.6*  PROT 5.9*  BILITOT 0.9  ALKPHOS 53  ALT 21  AST 15  GLUCOSE 100*      Radiology/Studies: DG Chest 2 View  Result Date: 04/27/2021 CLINICAL DATA:  Shortness of breath, syncope EXAM: CHEST - 2 VIEW COMPARISON:  Previous studies including the examination of 12/09/2014 FINDINGS: Cardiac size is within normal limits. Lung fields are clear of any infiltrates or pulmonary edema. There is no pleural effusion or pneumothorax. Pacemaker/defibrillator battery is seen in both infraclavicular regions.  IMPRESSION: There are no signs of pulmonary edema or focal pulmonary infiltrates. Electronically Signed   By: Ernie Avena M.D.   On: 04/27/2021 10:01    EKG:on arrival shows NSR at 80 bpm PR 138 QRS 82 (personally reviewed)  TELEMETRY: NSR 70-80s (personally reviewed)  DEVICE HISTORY: Medtronic single chamber ICD 2006 with 6949 lead. 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  VT with appropriate shock Started on IV amiodarone by fellow. Would continue until post cath in case there is agitation with catheter manipulation.  Continue BB Keep K > 4.0 and Mg > 2.0  Likely ischemic/scar mediated. Interrogation on admission showed no further VT per report.  Only strip available at desk is of the 2/5 episode.   2. Acute on Chronic systolic CHF Echo 10/2020 LVEF 13-24% Volume status overall looks OK.   3. H/o CAD HS trop 22 -> 21 Can't find most recent ischemic work up. Notes say "Chronically occluded LAD with a large anterior wall MI" Recommend updating her ischemic work up.  Recommend updating ischemic work up. She is NPO. Gen Cards team aware.  For questions or updates, please contact [facility] Please consult [email or web address] for contact info under Cardiology/STEMI.  Dustin Flock, PA-C  04/28/2021 6:40  AM

## 2021-04-28 NOTE — Progress Notes (Signed)
Pt in cath lab holding, [place] 2, Amiodarone gtt in place at 16.7cc/hr infusing through a PIV, no c/o pain or signs of distress at this time, safety maintained

## 2021-04-29 ENCOUNTER — Other Ambulatory Visit (HOSPITAL_COMMUNITY): Payer: Self-pay

## 2021-04-29 ENCOUNTER — Encounter (HOSPITAL_COMMUNITY): Payer: Self-pay | Admitting: Internal Medicine

## 2021-04-29 ENCOUNTER — Other Ambulatory Visit: Payer: Self-pay | Admitting: Cardiology

## 2021-04-29 ENCOUNTER — Other Ambulatory Visit: Payer: Self-pay | Admitting: Cardiovascular Disease

## 2021-04-29 DIAGNOSIS — I472 Ventricular tachycardia, unspecified: Secondary | ICD-10-CM | POA: Diagnosis not present

## 2021-04-29 DIAGNOSIS — I2583 Coronary atherosclerosis due to lipid rich plaque: Secondary | ICD-10-CM

## 2021-04-29 DIAGNOSIS — Z95810 Presence of automatic (implantable) cardiac defibrillator: Secondary | ICD-10-CM | POA: Diagnosis not present

## 2021-04-29 DIAGNOSIS — I4901 Ventricular fibrillation: Secondary | ICD-10-CM | POA: Diagnosis not present

## 2021-04-29 DIAGNOSIS — I255 Ischemic cardiomyopathy: Secondary | ICD-10-CM

## 2021-04-29 DIAGNOSIS — I5022 Chronic systolic (congestive) heart failure: Secondary | ICD-10-CM | POA: Diagnosis not present

## 2021-04-29 DIAGNOSIS — I2510 Atherosclerotic heart disease of native coronary artery without angina pectoris: Secondary | ICD-10-CM | POA: Diagnosis not present

## 2021-04-29 MED ORDER — FUROSEMIDE 40 MG PO TABS: 40.0000 mg | ORAL_TABLET | ORAL | Status: DC

## 2021-04-29 MED ORDER — SACUBITRIL-VALSARTAN 24-26 MG PO TABS
1.0000 | ORAL_TABLET | Freq: Two times a day (BID) | ORAL | 5 refills | Status: DC
Start: 2021-04-29 — End: 2021-11-05
  Filled 2021-04-29: qty 60, 30d supply, fill #0

## 2021-04-29 MED ORDER — SACUBITRIL-VALSARTAN 24-26 MG PO TABS
1.0000 | ORAL_TABLET | Freq: Two times a day (BID) | ORAL | Status: DC
  Administered 2021-04-29: 1 via ORAL
  Filled 2021-04-29: qty 1

## 2021-04-29 MED ORDER — CARVEDILOL 25 MG PO TABS
25.0000 mg | ORAL_TABLET | Freq: Two times a day (BID) | ORAL | 3 refills | Status: DC
  Filled 2021-04-29: qty 180, 90d supply, fill #0

## 2021-04-29 MED ORDER — CARVEDILOL 25 MG PO TABS: 25.0000 mg | ORAL_TABLET | Freq: Two times a day (BID) | ORAL | Status: DC

## 2021-04-29 MED ORDER — EZETIMIBE 10 MG PO TABS
10.0000 mg | ORAL_TABLET | Freq: Every day | ORAL | 3 refills | Status: DC
  Filled 2021-04-29: qty 90, 90d supply, fill #0

## 2021-04-29 MED ORDER — AMIODARONE HCL 200 MG PO TABS: ORAL_TABLET | ORAL | 3 refills | Status: DC

## 2021-04-29 MED ORDER — FUROSEMIDE 40 MG PO TABS
40.0000 mg | ORAL_TABLET | ORAL | 4 refills | Status: DC
Start: 2021-04-30 — End: 2021-07-28
  Filled 2021-04-29: qty 30, 70d supply, fill #0

## 2021-04-29 NOTE — Progress Notes (Addendum)
Electrophysiology Rounding Note  Patient Name: [name] Date of Encounter: 04/29/2021  Primary Cardiologist: Mertie Moores, MD Electrophysiologist: Vickie Epley, MD   Subjective   The patient is doing well today.  At this time, the patient denies chest pain, shortness of breath, or any new concerns.  Inpatient Medications    Scheduled Meds:  amiodarone  400 mg Oral BID   aspirin EC  81 mg Oral Daily   carvedilol  37.5 mg Oral BID WC   cholecalciferol  2,000 Units Oral Daily   citalopram  40 mg Oral Daily   enoxaparin (LOVENOX) injection  40 mg Subcutaneous Q24H   ezetimibe  10 mg Oral Daily   furosemide  40 mg Oral Daily   losartan  50 mg Oral Daily   mirtazapine  15 mg Oral QHS   pravastatin  40 mg Oral QPM   sodium chloride flush  3 mL Intravenous Q12H   sodium chloride flush  3 mL Intravenous Q12H   sodium chloride flush  3 mL Intravenous Q12H   vitamin B-12  100 mcg Oral Daily   Continuous Infusions:  sodium chloride     sodium chloride     sodium chloride     PRN Meds: sodium chloride, sodium chloride, sodium chloride, acetaminophen, ondansetron (ZOFRAN) IV, sodium chloride flush, sodium chloride flush, sodium chloride flush   Vital Signs    Vitals:   04/28/21 1540 04/28/21 1628 04/28/21 2100 04/29/21 0600  BP: (!) 109/45 (!) 109/55 (!) 156/143 118/62  Pulse: 65 67 80 74  Resp: 16 16 19 18   Temp:  98.1 F (36.7 C) 98 F (36.7 C) 98.7 F (37.1 C)  TempSrc:  Oral Oral Oral  SpO2: 93% 95% 93% 90%  Weight:    123.6 kg    Intake/Output Summary (Last 24 hours) at 04/29/2021 0820 Last data filed at 04/29/2021 0700 Gross per 24 hour  Intake 914.04 ml  Output 450 ml  Net 464.04 ml   Filed Weights   04/29/21 0600  Weight: 123.6 kg    Physical Exam    GEN- The patient is well appearing, alert and oriented x 3 today.   Head- normocephalic, atraumatic Eyes-  Sclera clear, conjunctiva pink Ears- hearing intact Oropharynx- clear Neck-  supple Lungs- Clear to ausculation bilaterally, normal work of breathing Heart- Regular rate and rhythm, no murmurs, rubs or gallops GI- soft, NT, ND, + BS Extremities- no clubbing or cyanosis. No edema Skin- no rash or lesion Psych- euthymic mood, full affect Neuro- strength and sensation are intact  Labs    CBC Recent Labs    04/27/21 0924  WBC 9.2  HGB 14.5  HCT 45.8  MCV 96.2  PLT [id number]   Basic Metabolic Panel Recent Labs    04/27/21 0925 04/28/21 0602  NA 138  --   K 3.8  --   CL 99  --   CO2 31  --   GLUCOSE 100*  --   BUN 17  --   CREATININE 0.99  --   CALCIUM 8.6*  --   MG  --  2.0   Liver Function Tests Recent Labs    04/27/21 0925  AST 15  ALT 21  ALKPHOS 53  BILITOT 0.9  PROT 5.9*  ALBUMIN 3.3*   No results for input(s): LIPASE, AMYLASE in the last 72 hours. Cardiac Enzymes No results for input(s): CKTOTAL, CKMB, CKMBINDEX, TROPONINI in the last 72 hours.   Telemetry    NSR  60-70s (personally reviewed)  Radiology    DG Chest 2 View  Result Date: 04/27/2021 CLINICAL DATA:  Shortness of breath, syncope EXAM: CHEST - 2 VIEW COMPARISON:  Previous studies including the examination of 12/09/2014 FINDINGS: Cardiac size is within normal limits. Lung fields are clear of any infiltrates or pulmonary edema. There is no pleural effusion or pneumothorax. Pacemaker/defibrillator battery is seen in both infraclavicular regions. IMPRESSION: There are no signs of pulmonary edema or focal pulmonary infiltrates. Electronically Signed   By: Elmer Picker M.D.   On: 04/27/2021 10:01   CARDIAC CATHETERIZATION  Result Date: 04/28/2021   Prox LAD lesion is 100% stenosed.   LV end diastolic pressure is normal.   The left ventricular ejection fraction is 35-45% by visual estimate. 1.  Proximal LAD chronic total occlusion collateralized by the right and with left to left collaterals. 2.  LVEDP of 13 mmHg with ejection fraction of approximately 40%.   ECHOCARDIOGRAM  COMPLETE  Result Date: 04/28/2021    ECHOCARDIOGRAM REPORT   Patient Name:   [name] Date of Exam: 04/28/2021 Medical Rec #:  [id number]     Height:       62.0 in Accession #:    [id number]    Weight:       262.0 lb Date of Birth:  [birth year]      BSA:          2.144 m Patient Age:    71 years      BP:           120/64 mmHg Patient Gender: F             HR:           75 bpm. Exam Location:  Inpatient Procedure: 2D Echo, Color Doppler, Cardiac Doppler and Intracardiac            Opacification Agent Indications:    I49.01 Ventricular fibrillation  History:        Patient has prior history of Echocardiogram examinations, most                 recent 11/05/2020. CHF, CAD, Defibrillator; Risk                 Factors:Dyslipidemia.  Sonographer:    Raquel Sarna Senior RDCS Referring Phys: [id number] Shirley Friar  Sonographer Comments: Technically difficult due to poor echo windows. IMPRESSIONS  1. Poor windows. LV wall motion cannot be fully assessed. Mild to moderate global hypokinesis. Apex is akinetic. No LV thrombus visualized. Left ventricular ejection fraction, by estimation, is 35 to 40%. The left ventricle has moderately decreased function. The left ventricle demonstrates global hypokinesis. Left ventricular diastolic parameters are indeterminate.  2. Right ventricular systolic function is normal. The right ventricular size is normal.  3. The mitral valve was not well visualized. No evidence of mitral valve regurgitation.  4. The aortic valve is normal in structure. Aortic valve regurgitation is not visualized.  5. The inferior vena cava is normal in size with greater than 50% respiratory variability, suggesting right atrial pressure of 3 mmHg. Conclusion(s)/Recommendation(s): No significant changes from prior. FINDINGS  Left Ventricle: Poor windows. LV wall motion cannot be fully assessed. Mild to moderate global hypokinesis. Apex is akinetic. No LV thrombus visualized. Left ventricular ejection fraction, by  estimation, is 35 to 40%. The left ventricle has moderately decreased function. The left ventricle demonstrates global hypokinesis. Definity contrast agent was given IV to delineate the left ventricular endocardial borders. The  left ventricular internal cavity size was normal in size. There is no left ventricular  hypertrophy. Left ventricular diastolic parameters are indeterminate. Right Ventricle: The right ventricular size is normal. No increase in right ventricular wall thickness. Right ventricular systolic function is normal. Left Atrium: Left atrial size was normal in size. Right Atrium: Right atrial size was normal in size. Pericardium: There is no evidence of pericardial effusion. Mitral Valve: The mitral valve was not well visualized. No evidence of mitral valve regurgitation. Tricuspid Valve: The tricuspid valve is not well visualized. Tricuspid valve regurgitation is not demonstrated. Aortic Valve: The aortic valve is normal in structure. Aortic valve regurgitation is not visualized. Pulmonic Valve: The pulmonic valve was not well visualized. Pulmonic valve regurgitation is mild. Aorta: The aortic root and ascending aorta are structurally normal, with no evidence of dilitation. Venous: The inferior vena cava is normal in size with greater than 50% respiratory variability, suggesting right atrial pressure of 3 mmHg. IAS/Shunts: The interatrial septum was not well visualized.  LEFT VENTRICLE PLAX 2D LVIDd:         4.40 cm LVIDs:         3.80 cm LV PW:         0.90 cm LV IVS:        0.90 cm LVOT diam:     1.90 cm LV SV:         52 LV SV Index:   24 LVOT Area:     2.84 cm  LEFT ATRIUM           Index        RIGHT ATRIUM           Index LA diam:      3.30 cm 1.54 cm/m   RA Area:     16.60 cm LA Vol (A2C): 43.3 ml 20.19 ml/m  RA Volume:   38.70 ml  18.05 ml/m LA Vol (A4C): 60.1 ml 28.03 ml/m  AORTIC VALVE LVOT Vmax:   100.00 cm/s LVOT Vmean:  72.200 cm/s LVOT VTI:    0.185 m  AORTA Ao Root diam: 2.80 cm  Ao Asc diam:  3.20 cm MITRAL VALVE MV Area (PHT): 3.70 cm    SHUNTS MV Decel Time: 205 msec    Systemic VTI:  0.18 m MV E velocity: 74.50 cm/s  Systemic Diam: 1.90 cm MV A velocity: 61.30 cm/s MV E/A ratio:  1.22 Landscape architect signed by Phineas Inches Signature Date/Time: 04/28/2021/11:17:30 AM    Final     Patient Profile     ROCELIA DAGRACA is a 71 y.o. female with a history of chronic systolic CHF s/p ICD, CAD, morbid obesity, who is being seen today for the evaluation of ICD shock at the request of Dr. Conley Canal.  Assessment & Plan    1.  VT with appropriate shock Quiescent on po amiodarone. No target lesion on cath.  Amiodarone 400 mg BID x 7 days, then 400 mg daily x 7 days, then 200 mg daily chronically.   Continue BB. Titrate as tolerated Agree with trying to eventually get back on Entresto, which has shown some anti-arrhythmic potential (thought possible due to improving CO) Keep K > 4.0 and Mg > 2.0  Likely ischemic/scar mediated. Interrogation on admission showed no further VT per report.  Only strip available at desk is of the 2/5 episode.    2. Acute on Chronic systolic CHF Echo this admission stable EF 35-40% Volume status overall looks OK.  GDMT  being titrated per gen cards.   3. H/o CAD Cath this admission with known LAD CTO with good collaterals.  ? If this is something that could be intervened upon down the road.   EP to see as needed while remains here. Outpatient follow up made.  For questions or updates, please contact [facility] Please consult [email or web address] for contact info under Cardiology/STEMI.  Signed, Shirley Friar, PA-C  04/29/2021, 8:20 AM

## 2021-04-29 NOTE — Discharge Summary (Addendum)
Discharge Summary    Patient ID: [name] MRN: [id number]; DOB: December 23, 1950  Admit date: 04/27/2021 Discharge date: 04/29/2021  PCP:  Nancy Salinas, MD   [facility] Providers Cardiologist:  Mertie Moores, MD  Electrophysiologist:  Vickie Epley, MD  { Discharge Diagnoses    Principal Problem:   Ventricular fibrillation [facility]) Active Problems:   CAD (coronary artery disease)   Chronic systolic congestive heart failure, NYHA class 2 ([place])   ICD (implantable cardioverter-defibrillator), single, in situ   Dyslipidemia    Diagnostic Studies/Procedures    Left Heart Cath 04/28/2021    Prox LAD lesion is 100% stenosed.   LV end diastolic pressure is normal.   The left ventricular ejection fraction is 35-45% by visual estimate.   1.  Proximal LAD chronic total occlusion collateralized by the right and with left to left collaterals. 2.  LVEDP of 13 mmHg with ejection fraction of approximately 40%. Diagnostic Dominance: Right     Echo 04/28/2021   1. Poor windows. LV wall motion cannot be fully assessed. Mild to  moderate global hypokinesis. Apex is akinetic. No LV thrombus visualized.  Left ventricular ejection fraction, by estimation, is 35 to 40%. The left  ventricle has moderately decreased  function. The left ventricle demonstrates global hypokinesis. Left  ventricular diastolic parameters are indeterminate.   2. Right ventricular systolic function is normal. The right ventricular  size is normal.   3. The mitral valve was not well visualized. No evidence of mitral valve  regurgitation.   4. The aortic valve is normal in structure. Aortic valve regurgitation is  not visualized.   5. The inferior vena cava is normal in size with greater than 50%  respiratory variability, suggesting right atrial pressure of 3 mmHg.   Conclusion(s)/Recommendation(s): No significant changes from prior.  ____________   History of Present Illness     [name] is a 71  y.o. female with ICM (EF = 35%) s/p single chamber MDT AICD, CAD and HLD who is being seen 04/27/2021 for the evaluation of dizziness in the setting of a recent ICD shock.  [name] stated that on 04/25/21 she was in her USOH when she was on the phone with her son and lost consciousness. She stated that she was using the restroom when she felt lightheaded then "blacked out". She believed that this episode lasted for ~30 seconds before she regained consciousness. She denied falling to the ground or head trauma. She denied feeling her defibrillator go off. She reported that over the past 1-2 weeks she has ongoing URI symptoms with nasal congestion, post nasal drip and non-productive cough for which she completed a course of prednisone for. She had been feeling "lousy" since the onset of these symptoms and had experienced intermittent lightheadedness. The day after losing consciousness, she was contacted by her cardiologist who noted that her defibrillator had fired on 2/5 at 16:05 for VF at a mean HR of 400 bpm. Since this episode she denied having any more syncopal events, but she continued to experience intermittent dizziness/lightheadedness that occurred upon standing. She denied fevers, chills, palpitations, chest pain, PND, orthopnea, GI symptoms, or urinary changes. She endorsed mild swelling of the ankles and face that had increased over the past few days. Given the recent ICD shock and her ongoing dizziness, she elected to come to the ER on 2/7 for evaluation.    In the ED her VS were all WNL. Labs were notable for troponin 22, but flat  on repeat. Other labs all WNL. CXR without infiltrates and EKG with NSR with occasional PVCs. She is currently asymptomatic. She was started on an amiodarone IV infusion in the ED and admitted to cardiology for further care.   Hospital Course     Consultants: Electrophysiology    Ventricular Tachycardia/Ventricular Fibrillation, with appropriate shock  - Interrogation  of the ICD revealed that patient had a true episode of ravid ventricular tachycardia and was appropriately defibrillated  - EP was consulted, thought episode of VT ischemia/scar related - Underwent left heart cath on 2/8 for ischemic eval. Cath showed 100% stenosis of the proximal LAD with collateral flow from RCA. No interventions completed  - Transitioned from IV amiodarone to oral on 2/8 after cath. Continue oral amiodarone 400 mg daily with outpatient follow up for supervision of medication adjustments - Continue coreg  - Maintain K>4.0, Mag >2    CAD  - History of anterior wall MI  - LHC this admission showed 100% stenosis of proximal LAD with collateral flow from RCA, no interventions completed  - Continue BB at a reduced dose (coreg 25 mg BID), ASA, cholesterol medication (zetia, pravastatin)    HLD  - Continue pravastatin. zetia added this admission  - Patient intolerant of rosuvastatin, atorvastatin, simvastatin  - Las LDL 85  - Will need referral to lipid clinic for consideration of PCSK9 inhibitor or Inclisiran    Chronic Combined Systolic and Diastolic CHF  - Echo this admission showed EF 35-40% (stable) - Previously on Entresto, could not afford it so was taking losartan PTA - On losartan, but strongly encouraged to go back to entresto. Will be on entresto at discharge.  - On BB, reduced dose of coreg to 25 mg daily  - Decreased oral lasix to 40mg  PO MWF to make BP room for entresto. Instructed patient on PRN dosing for weight gain, and instructed patient to take K supplementation on days when she takes lasix  - Patient will need a BMET in 1 week. Appointment scheduled   Did the patient have an acute coronary syndrome (MI, NSTEMI, STEMI, etc) this admission?:  No                               Did the patient have a percutaneous coronary intervention (stent / angioplasty)?:  No.        The patient will be scheduled for a TOC follow up appointment in 30 days.  A message has  been sent to the [facility] and Scheduling Pool at the office where the patient should be seen for follow up.  _____________  Discharge Vitals Blood pressure (!) 96/59, pulse 85, temperature 98.7 F (37.1 C), temperature source Oral, resp. rate 16, weight 123.6 kg, SpO2 95 %.  Filed Weights   04/29/21 0600  Weight: 123.6 kg    Labs & Radiologic Studies    CBC Recent Labs    04/27/21 0924  WBC 9.2  HGB 14.5  HCT 45.8  MCV 96.2  PLT 99991111   Basic Metabolic Panel Recent Labs    04/27/21 0925 04/28/21 0602  NA 138  --   K 3.8  --   CL 99  --   CO2 31  --   GLUCOSE 100*  --   BUN 17  --   CREATININE 0.99  --   CALCIUM 8.6*  --   MG  --  2.0   Liver Function Tests  Recent Labs    04/27/21 0925  AST 15  ALT 21  ALKPHOS 53  BILITOT 0.9  PROT 5.9*  ALBUMIN 3.3*   No results for input(s): LIPASE, AMYLASE in the last 72 hours. High Sensitivity Troponin:   Recent Labs  Lab 04/27/21 1940 04/27/21 2122  TROPONINIHS 22* 21*    BNP Invalid input(s): POCBNP D-Dimer No results for input(s): DDIMER in the last 72 hours. Hemoglobin A1C No results for input(s): HGBA1C in the last 72 hours. Fasting Lipid Panel No results for input(s): CHOL, HDL, LDLCALC, TRIG, CHOLHDL, LDLDIRECT in the last 72 hours. Thyroid Function Tests Recent Labs    04/28/21 0602  TSH 3.062   _____________  DG Chest 2 View  Result Date: 04/27/2021 CLINICAL DATA:  Shortness of breath, syncope EXAM: CHEST - 2 VIEW COMPARISON:  Previous studies including the examination of 12/09/2014 FINDINGS: Cardiac size is within normal limits. Lung fields are clear of any infiltrates or pulmonary edema. There is no pleural effusion or pneumothorax. Pacemaker/defibrillator battery is seen in both infraclavicular regions. IMPRESSION: There are no signs of pulmonary edema or focal pulmonary infiltrates. Electronically Signed   By: Elmer Picker M.D.   On: 04/27/2021 10:01   CARDIAC CATHETERIZATION  Result  Date: 04/28/2021   Prox LAD lesion is 100% stenosed.   LV end diastolic pressure is normal.   The left ventricular ejection fraction is 35-45% by visual estimate. 1.  Proximal LAD chronic total occlusion collateralized by the right and with left to left collaterals. 2.  LVEDP of 13 mmHg with ejection fraction of approximately 40%.   ECHOCARDIOGRAM COMPLETE  Result Date: 04/28/2021    ECHOCARDIOGRAM REPORT   Patient Name:   [name] Date of Exam: 04/28/2021 Medical Rec #:  [id number]     Height:       62.0 in Accession #:    [id number]    Weight:       262.0 lb Date of Birth:  September 07, 1950      BSA:          2.144 m Patient Age:    25 years      BP:           120/64 mmHg Patient Gender: F             HR:           75 bpm. Exam Location:  Inpatient Procedure: 2D Echo, Color Doppler, Cardiac Doppler and Intracardiac            Opacification Agent Indications:    I49.01 Ventricular fibrillation  History:        Patient has prior history of Echocardiogram examinations, most                 recent 11/05/2020. CHF, CAD, Defibrillator; Risk                 Factors:Dyslipidemia.  Sonographer:    Raquel Sarna Senior RDCS Referring Phys: [id number] Shirley Friar  Sonographer Comments: Technically difficult due to poor echo windows. IMPRESSIONS  1. Poor windows. LV wall motion cannot be fully assessed. Mild to moderate global hypokinesis. Apex is akinetic. No LV thrombus visualized. Left ventricular ejection fraction, by estimation, is 35 to 40%. The left ventricle has moderately decreased function. The left ventricle demonstrates global hypokinesis. Left ventricular diastolic parameters are indeterminate.  2. Right ventricular systolic function is normal. The right ventricular size is normal.  3. The mitral valve was not well visualized. No  evidence of mitral valve regurgitation.  4. The aortic valve is normal in structure. Aortic valve regurgitation is not visualized.  5. The inferior vena cava is normal in size with greater  than 50% respiratory variability, suggesting right atrial pressure of 3 mmHg. Conclusion(s)/Recommendation(s): No significant changes from prior. FINDINGS  Left Ventricle: Poor windows. LV wall motion cannot be fully assessed. Mild to moderate global hypokinesis. Apex is akinetic. No LV thrombus visualized. Left ventricular ejection fraction, by estimation, is 35 to 40%. The left ventricle has moderately decreased function. The left ventricle demonstrates global hypokinesis. Definity contrast agent was given IV to delineate the left ventricular endocardial borders. The left ventricular internal cavity size was normal in size. There is no left ventricular  hypertrophy. Left ventricular diastolic parameters are indeterminate. Right Ventricle: The right ventricular size is normal. No increase in right ventricular wall thickness. Right ventricular systolic function is normal. Left Atrium: Left atrial size was normal in size. Right Atrium: Right atrial size was normal in size. Pericardium: There is no evidence of pericardial effusion. Mitral Valve: The mitral valve was not well visualized. No evidence of mitral valve regurgitation. Tricuspid Valve: The tricuspid valve is not well visualized. Tricuspid valve regurgitation is not demonstrated. Aortic Valve: The aortic valve is normal in structure. Aortic valve regurgitation is not visualized. Pulmonic Valve: The pulmonic valve was not well visualized. Pulmonic valve regurgitation is mild. Aorta: The aortic root and ascending aorta are structurally normal, with no evidence of dilitation. Venous: The inferior vena cava is normal in size with greater than 50% respiratory variability, suggesting right atrial pressure of 3 mmHg. IAS/Shunts: The interatrial septum was not well visualized.  LEFT VENTRICLE PLAX 2D LVIDd:         4.40 cm LVIDs:         3.80 cm LV PW:         0.90 cm LV IVS:        0.90 cm LVOT diam:     1.90 cm LV SV:         52 LV SV Index:   24 LVOT Area:      2.84 cm  LEFT ATRIUM           Index        RIGHT ATRIUM           Index LA diam:      3.30 cm 1.54 cm/m   RA Area:     16.60 cm LA Vol (A2C): 43.3 ml 20.19 ml/m  RA Volume:   38.70 ml  18.05 ml/m LA Vol (A4C): 60.1 ml 28.03 ml/m  AORTIC VALVE LVOT Vmax:   100.00 cm/s LVOT Vmean:  72.200 cm/s LVOT VTI:    0.185 m  AORTA Ao Root diam: 2.80 cm Ao Asc diam:  3.20 cm MITRAL VALVE MV Area (PHT): 3.70 cm    SHUNTS MV Decel Time: 205 msec    Systemic VTI:  0.18 m MV E velocity: 74.50 cm/s  Systemic Diam: 1.90 cm MV A velocity: 61.30 cm/s MV E/A ratio:  1.22 Landscape architect signed by Phineas Inches Signature Date/Time: 04/28/2021/11:17:30 AM    Final    Disposition   Pt is being discharged home today in good condition.  Follow-up Plans & Appointments     Follow-up Information     Emmaline Life, NP Follow up on 06/03/2021.   Specialty: Nurse Practitioner Why: Appointment at [place], please arrive 15 minutes prior.  With Christen Bame, an NP who  works closely with Dr. Gerilyn Pilgrim information: [place] [id number] [phone]         [facility] Follow up on 05/07/2021.   Specialty: Cardiology Why: Please present to the [facility] office on 05/07/2021 at 9:15 AM for labs to be drawn. There is no need to fast prior to this blood draw Contact information: [address], [place] [phone]               Discharge Instructions     AMB Referral to Advanced Lipid Disorders Clinic   Complete by: As directed    Internal Lipid Clinic Referral Scheduling  Internal lipid clinic referrals are providers within [facility], who wish to refer established patients for routine management (help in starting PCSK9 inhibitor therapy) or advanced therapies.  Internal MD referral criteria:              1. All patients with LDL>190 mg/dL  2. All patients with Triglycerides >500 mg/dL  3. Patients with  suspected or confirmed heterozygous familial hyperlipidemia (HeFH) or homozygous familial hyperlipidemia (HoFH)  4. Patients with family history of suspicious for genetic dyslipidemia desiring genetic testing  5. Patients refractory to standard guideline based therapy  6. Patients with statin intolerance (failed 2 statins, one of which must be a high potency statin)  7. Patients who the provider desires to be seen by MD   Internal PharmD referral criteria:   1. Follow-up patients for medication management  2. Follow-up for compliance monitoring  3. Patients for drug education  4. Patients with statin intolerance  5. PCSK9 inhibitor education and prior authorization approvals  6. Patients with triglycerides <500 mg/dL  External Lipid Clinic Referral  External lipid clinic referrals are for providers outside of [facility], considered new clinic patients - automatically routed to MD schedule   Diet - low sodium heart healthy   Complete by: As directed    Discharge instructions   Complete by: As directed    Radial Site Care Refer to this sheet in the next few weeks. These instructions provide you with information on caring for yourself after your procedure. Your caregiver may also give you more specific instructions. Your treatment has been planned according to current medical practices, but problems sometimes occur. Call your caregiver if you have any problems or questions after your procedure. HOME CARE INSTRUCTIONS You may shower the day after the procedure. Remove the bandage (dressing) and gently wash the site with plain soap and water. Gently pat the site dry.  Do not apply powder or lotion to the site.  Do not submerge the affected site in water for 3 to 5 days.  Inspect the site at least twice daily.  Do not flex or bend the affected arm for 24 hours.  No lifting over 5 pounds (2.3 kg) for 5 days after your procedure.  Do not drive home if you are discharged the same day of  the procedure. Have someone else drive you.  You may drive 24 hours after the procedure unless otherwise instructed by your caregiver.  What to expect: Any bruising will usually fade within 1 to 2 weeks.  Blood that collects in the tissue (hematoma) may be painful to the touch. It should usually decrease in size and tenderness within 1 to 2 weeks.  SEEK IMMEDIATE MEDICAL CARE IF: You have unusual pain at the radial site.  You have redness, warmth, swelling, or  pain at the radial site.  You have drainage (other than a small amount of blood on the dressing).  You have chills.  You have a fever or persistent symptoms for more than 72 hours.  You have a fever and your symptoms suddenly get worse.  Your arm becomes pale, cool, tingly, or numb.  You have heavy bleeding from the site. Hold pressure on the site.   Lasix dosed MWF, take potassium supplementation MWF (only on days that you take lasix). Also take your lasix PRN for weight gain. If you notice that you have gained 3+ lbs in 1 day, or 5+ lbs in 1 week, call the office for further instructions.   Increase activity slowly   Complete by: As directed        Discharge Medications   Allergies as of 04/29/2021       Reactions   Codeine Nausea And Vomiting   Crestor [rosuvastatin] Other (See Comments)   Muscle aches   Doxycycline Nausea Only   Ecotrin [aspirin] Other (See Comments)   BLEEDING ULCER - ONLY IN HIGH DOSES   Lipitor [atorvastatin] Other (See Comments)   Muscle aches   Nsaids Other (See Comments)   BLEEDING ULCER   Penicillins Rash   Sulfa Drugs Cross Reactors Nausea And Vomiting   Vitamin C Other (See Comments)   Upsets stomach   Zocor [simvastatin] Other (See Comments)   Muscle aches        Medication List     STOP taking these medications    losartan 50 MG tablet Commonly known as: COZAAR       TAKE these medications    acetaminophen 650 MG CR tablet Commonly known as: TYLENOL Take 1,300 mg by  mouth every 8 (eight) hours as needed for pain.   amiodarone 200 MG tablet Commonly known as: PACERONE Take 2 tablets (400 mg total) twice daily x 7 days, then 2 tablets (400 mg total) once daily x 7 days, then 1 tablet (200 mg total) daily chronically.   aspirin EC 81 MG tablet Take 81 mg by mouth daily.   carvedilol 25 MG tablet Commonly known as: COREG Take 1 tablet (25 mg total) by mouth 2 (two) times daily with a meal. What changed:  how much to take how to take this when to take this additional instructions   cetirizine 10 MG tablet Commonly known as: ZYRTEC Take 10 mg by mouth daily as needed for allergies.   citalopram 40 MG tablet Commonly known as: CELEXA Take 20 mg by mouth daily.   ezetimibe 10 MG tablet Commonly known as: ZETIA Take 1 tablet (10 mg total) by mouth daily.   fexofenadine 180 MG tablet Commonly known as: ALLEGRA Take 180 mg by mouth daily.   furosemide 40 MG tablet Commonly known as: LASIX Take 1 tablet (40 mg total) by mouth every Monday, Wednesday, and Friday. Start taking on: April 30, 2021 What changed: when to take this   mirtazapine 15 MG tablet Commonly known as: REMERON Take 15 mg by mouth at bedtime as needed (sleep).   montelukast 10 MG tablet Commonly known as: SINGULAIR Take 10 mg by mouth at bedtime.   potassium chloride 10 MEQ tablet Commonly known as: KLOR-CON M Take 1 tablet by mouth once daily   pravastatin 40 MG tablet Commonly known as: PRAVACHOL Take 1 tablet (40 mg total) by mouth every evening.   predniSONE 20 MG tablet Commonly known as: DELTASONE Take 40 mg by mouth See  admin instructions. 40 mg  qd x 3 days, 20 mg qd x 3 days, 10 mg qd x 3 days, then stop   sacubitril-valsartan 24-26 MG Commonly known as: ENTRESTO Take 1 tablet by mouth 2 (two) times daily.   vitamin B-12 100 MCG tablet Commonly known as: CYANOCOBALAMIN Take 100 mcg by mouth daily.   Vitamin D 50 MCG (2000 UT) Caps Take 1  capsule (2,000 Units total) by mouth daily.           Outstanding Labs/Studies   BMP in 1 week   Duration of Discharge Encounter   Greater than 30 minutes including physician time.  Signed, Margie Billet, PA-C 04/29/2021, 10:20 AM   Attending Note:   The patient was seen and examined.  Agree with assessment and plan as noted above.  Changes made to the above note as needed.  Patient seen and independently examined with KJ Johnsom, PA .   We discussed all aspects of the encounter. I agree with the assessment and plan as stated above.    Ventricular tach:  stable after the addition of amiodarone.   Cont amio dosing schedule as outlined by EP.  Has ICD.  Follow up with EP has been arranged. 2.   CHF:  has been noncompliant with her diet,   she now agrees to take her Delene Loll Will reduce her lasix to 40 mg M/W/F .  Reduce Kdur to m/w/f also Reduce coreg to 25 bid   Bmp in 1-2 weeks Follow up with Christen Bame, NP in mid March .   3.  Pre -op .  She needs to have a hysterectomy.    If she remains stable ( no further ICD discharges, no VT ) and has no CHF exacerbations, she will be at low risk for her upcoming hysterectomy.  She may hold her ASA for 5-7 days prior to procedure.  4.  Possible OSA:  she needs a sleep study.  She may ask her primary doctor to do this.  If she has not had it done prior to her next visit, we will arrange.   5.  Hyperlipidemia:   needs referral to lipid clinic.  Intol to the high intensity statins.  Will need to be considered for PSCK9i or Inclisiran.     I have spent a total of 40 minutes with patient reviewing hospital  notes , telemetry, EKGs, labs and examining patient as well as establishing an assessment and plan that was discussed with the patient.  > 50% of time was spent in direct patient care.    Thayer Headings, Brooke Bonito., MD, [facility] 04/29/2021, 10:27 AM [address],  [place] Pager [phone]

## 2021-04-29 NOTE — Care Management Obs Status (Signed)
MEDICARE OBSERVATION STATUS NOTIFICATION   Patient Details  Name: HERTA HINK MRN: [id number] Date of Birth: 08-28-1950   Medicare Observation Status Notification Given:  Yes    Gala Lewandowsky, RN 04/29/2021, 10:06 AM

## 2021-04-29 NOTE — TOC Benefit Eligibility Note (Signed)
Patient Product/process development scientist completed.    The patient is currently admitted and upon discharge could be taking Entresto 24-26 mg.  The current 30 day co-pay is, $37.00.   The patient is insured through [employer] [place] Medicare Part D    Roland Earl, CPhT Pharmacy Patient Advocate Specialist [facility] Patient Advocate Team Direct Number: [phone]  Fax: [phone]

## 2021-04-30 ENCOUNTER — Encounter: Payer: Self-pay | Admitting: Cardiovascular Disease

## 2021-04-30 MED ORDER — POTASSIUM CHLORIDE CRYS ER 10 MEQ PO TBCR: 10.0000 meq | EXTENDED_RELEASE_TABLET | Freq: Every day | ORAL | 0 refills | Status: DC

## 2021-05-05 ENCOUNTER — Encounter: Payer: Self-pay | Admitting: Cardiovascular Disease

## 2021-05-05 NOTE — Telephone Encounter (Signed)
Spoke with the pts daughter, Inetta Fermo, and she says that she spoke with a nurse at the PCP office a few minutes ago and they have sent the MD an Urgent message... she will continue to monitor her and will wait for the PCP office to call her back and if she worsens and she feels needed she will call EMS to take her to the ED.

## 2021-05-07 ENCOUNTER — Other Ambulatory Visit: Payer: Medicare Other

## 2021-05-10 ENCOUNTER — Encounter: Payer: Self-pay | Admitting: Cardiovascular Disease

## 2021-05-13 ENCOUNTER — Other Ambulatory Visit: Payer: Self-pay

## 2021-05-13 ENCOUNTER — Other Ambulatory Visit: Payer: Medicare Other

## 2021-05-13 DIAGNOSIS — I255 Ischemic cardiomyopathy: Secondary | ICD-10-CM

## 2021-05-14 LAB — BASIC METABOLIC PANEL
BUN/Creatinine Ratio: 16 (ref 12–28)
BUN: 15 mg/dL (ref 8–27)
CO2: 24 mmol/L (ref 20–29)
Calcium: 9.3 mg/dL (ref 8.7–10.3)
Chloride: 99 mmol/L (ref 96–106)
Creatinine, Ser: 0.92 mg/dL (ref 0.57–1.00)
Glucose: 81 mg/dL (ref 70–99)
Potassium: 5 mmol/L (ref 3.5–5.2)
Sodium: 144 mmol/L (ref 134–144)
eGFR: 67 mL/min/{1.73_m2} (ref >59)

## 2021-05-24 NOTE — Progress Notes (Signed)
? ? ?Electrophysiology Office Note ?Date: 05/24/2021 ? ?ID:  [name], DOB 1950/10/09, MRN [id number] ? ?PCP: Wayland Salinas, MD ?Primary Cardiologist: Mertie Moores, MD ?Electrophysiologist: Vickie Epley, MD  ? ?CC: Routine ICD follow-up ? ?[name] is a 71 y.o. female seen today for Vickie Epley, MD for post hospital follow up.   ? ?Admitted 2/7 - 2/9 with VT arrest with ICD shock. Cath showed 100% stenosis of proximal LAD with collateral from RCA. Transitioned to po amiodarone and remained electrically quiescent.  ? ?Since discharge from hospital the patient reports doing very well. She has lost about 9 lbs. Taking lasix sliding scale, anywhere from 2-7 times a week.  she denies chest pain, palpitations, dyspnea, PND, orthopnea, nausea, vomiting, dizziness, syncope, edema, weight gain, or early satiety. She has not had ICD shocks.  ? ?Device History: ?DEVICE HISTORY: Medtronic single chamber ICD 2006 with [phone] lead. -> attempt at gen change lead revision 10/2014 -> left subclavian vein stenosis -> R sided system implanted  11/2014 for chronic systolic CHF ? ?Past Medical History:  ?Diagnosis Date  ? AICD (automatic cardioverter/defibrillator) present 12/08/2014  ? CAD (coronary artery disease)   ? CHRONICALLY OCCLUDED LAD WITH A LARGE ANTERIOR WALL MI   ? CHF (congestive heart failure) ([place])   ? EF 30-40%  ? Dyslipidemia   ? Hypertension   ? ICD (implantable cardiac defibrillator), single, in situ   ? MEDTRONIC  ? Ischemic cardiomyopathy   ? Myocardial infarction [facility])   ? ANTERIOR WALL  ? ?Past Surgical History:  ?Procedure Laterality Date  ? CARDIAC CATHETERIZATION  01/13/05, 06/29/99  ? CARDIAC DEFIBRILLATOR PLACEMENT  02/21/05  ? SINGLE/MEDTRONIC  ? EP IMPLANTABLE DEVICE N/A 11/14/2014  ? Procedure:  ICD Generator Changeout with new lead & capping old [phone] lead;  Surgeon: Evans Lance, MD;  Location: [place] [facility];  Service: Cardiovascular;  Laterality: N/A;  ? EP IMPLANTABLE DEVICE  N/A 12/08/2014  ? Procedure: ICD Implant;  Surgeon: Evans Lance, MD;  Location: [place] [facility];  Service: Cardiovascular;  Laterality: N/A;  ? LEFT HEART CATH AND CORONARY ANGIOGRAPHY N/A 04/28/2021  ? Procedure: LEFT HEART CATH AND CORONARY ANGIOGRAPHY;  Surgeon: Early Osmond, MD;  Location: [place] [facility];  Service: Cardiovascular;  Laterality: N/A;  ? ? ?Current Outpatient Medications  ?Medication Sig Dispense Refill  ? acetaminophen (TYLENOL) 650 MG CR tablet Take 1,300 mg by mouth every 8 (eight) hours as needed for pain.    ? amiodarone (PACERONE) 200 MG tablet Take 2 tablets (400 mg total) twice daily x 7 days, then 2 tablets (400 mg total) once daily x 7 days, then 1 tablet (200 mg total) daily chronically. 56 tablet 3  ? aspirin EC 81 MG tablet Take 81 mg by mouth daily.    ? carvedilol (COREG) 25 MG tablet Take 1 tablet (25 mg total) by mouth 2 (two) times daily with a meal. 180 tablet 3  ? cetirizine (ZYRTEC) 10 MG tablet Take 10 mg by mouth daily as needed for allergies. (Patient not taking: Reported on 04/22/2021)    ? Cholecalciferol (VITAMIN D) 2000 UNITS CAPS Take 1 capsule (2,000 Units total) by mouth daily. 30 capsule   ? citalopram (CELEXA) 40 MG tablet Take 20 mg by mouth daily.    ? ezetimibe (ZETIA) 10 MG tablet Take 1 tablet (10 mg total) by mouth daily. 90 tablet 3  ? fexofenadine (ALLEGRA) 180 MG tablet Take 180  mg by mouth daily.    ? furosemide (LASIX) 40 MG tablet Take 1 tablet (40 mg total) by mouth every Monday, Wednesday, and Friday. 30 tablet 4  ? mirtazapine (REMERON) 15 MG tablet Take 15 mg by mouth at bedtime as needed (sleep).    ? montelukast (SINGULAIR) 10 MG tablet Take 10 mg by mouth at bedtime.  0  ? potassium chloride (KLOR-CON M) 10 MEQ tablet Take 1 tablet (10 mEq total) by mouth daily. 90 tablet 0  ? pravastatin (PRAVACHOL) 40 MG tablet Take 1 tablet (40 mg total) by mouth every evening. 90 tablet 3  ? predniSONE (DELTASONE) 20 MG tablet Take 40 mg by mouth See  admin instructions. 40 mg  qd x 3 days, 20 mg qd x 3 days, 10 mg qd x 3 days, then stop (Patient not taking: Reported on 04/27/2021)    ? sacubitril-valsartan (ENTRESTO) 24-26 MG Take 1 tablet by mouth 2 (two) times daily. 60 tablet 5  ? vitamin B-12 (CYANOCOBALAMIN) 100 MCG tablet Take 100 mcg by mouth daily.    ? ?No current facility-administered medications for this visit.  ? ? ?Allergies:   Codeine, Crestor [rosuvastatin], Doxycycline, Ecotrin [aspirin], Lipitor [atorvastatin], Nsaids, Penicillins, Sulfa drugs cross reactors, Vitamin c, and Zocor [simvastatin]  ? ?Social History: ?Social History  ? ?Socioeconomic History  ? Marital status: Married  ?  Spouse name: Not on file  ? Number of children: Not on file  ? Years of education: Not on file  ? Highest education level: Not on file  ?Occupational History  ? Not on file  ?Tobacco Use  ? Smoking status: Never  ? Smokeless tobacco: Never  ?Substance and Sexual Activity  ? Alcohol use: No  ? Drug use: No  ? Sexual activity: Not on file  ?Other Topics Concern  ? Not on file  ?Social History Narrative  ? Not on file  ? ?Social Determinants of Health  ? ?Financial Resource Strain: Not on file  ?Food Insecurity: Not on file  ?Transportation Needs: Not on file  ?Physical Activity: Not on file  ?Stress: Not on file  ?Social Connections: Not on file  ?Intimate Partner Violence: Not on file  ? ? ?Family History: ?Family History  ?Problem Relation Age of Onset  ? Heart attack Mother 57  ? Coronary artery disease Mother   ? Aneurysm Brother   ? Hypertension Father   ? ? ?Review of Systems: ?All other systems reviewed and are otherwise negative except as noted above. ? ? ?Physical Exam: ?There were no vitals filed for this visit.  ? ?GEN- The patient is well appearing, alert and oriented x 3 today.   ?HEENT: normocephalic, atraumatic; sclera clear, conjunctiva pink; hearing intact; oropharynx clear; neck supple, no JVP ?Lymph- no cervical lymphadenopathy ?Lungs- Clear to  ausculation bilaterally, normal work of breathing.  No wheezes, rales, rhonchi ?Heart- Regular rate and rhythm, no murmurs, rubs or gallops, PMI not laterally displaced ?GI- soft, non-tender, non-distended, bowel sounds present, no hepatosplenomegaly ?Extremities- no clubbing or cyanosis. No edema; DP/PT/radial pulses 2+ bilaterally ?MS- no significant deformity or atrophy ?Skin- warm and dry, no rash or lesion; ICD pocket well healed ?Psych- euthymic mood, full affect ?Neuro- strength and sensation are intact ? ?ICD interrogation- reviewed in detail today,  See PACEART report ? ?EKG:  EKG is ordered today. ?Personal review of EKG ordered today shows NSR at 76 bpm ? ?Recent Labs: ?04/27/2021: ALT 21; B Natriuretic Peptide 60.9; Hemoglobin 14.5; Platelets 190 ?04/28/2021: Magnesium  2.0; TSH 3.062 ?05/13/2021: BUN 15; Creatinine, Ser 0.92; Potassium 5.0; Sodium 144  ? ?Wt Readings from Last 3 Encounters:  ?04/29/21 272 lb 7.8 oz (123.6 kg)  ?04/22/21 262 lb (118.8 kg)  ?04/13/21 259 lb (117.5 kg)  ?  ? ?Other studies Reviewed: ?Additional studies/ records that were reviewed today include: Previous EP office notes.  ? ?Assessment and Plan: ? ?1.  Chronic systolic dysfunction s/p Medtronic single chamber ICD  ?euvolemic today ?Stable on an appropriate medical regimen ?Normal ICD function ?See Claudia Desan[facility] Art report ?No changes today ? ?2. VT ?With recent shock ?Continue amiodarone ?Surveillance labs today  ?Reviewed [place] guidelines ? ?3. H/o CAD ?Recent cath with known LAD CTO with good collaterals ? ?Current medicines are reviewed at length with the patient today.   ? ?Labs/ tests ordered today include:  ?CMET, TSH ? ?Disposition:   Follow up with Dr. Quentin Ore in 3 months  ? ? ?Signed, ?Shirley Friar, PA-C  ?05/24/2021 ?9:38 AM ? ?[facility] ?[address] ?[address] ?[place] [address] ?[phone] (office) ?[phone] (fax)  ?

## 2021-05-25 ENCOUNTER — Other Ambulatory Visit (HOSPITAL_COMMUNITY): Payer: Self-pay

## 2021-05-28 NOTE — Progress Notes (Signed)
Patient ID: [name]                 DOB: [birth year]                    MRN: [id number] ? ? ? ? ?HPI: ?[name] is a 71 y.o. female patient referred to lipid clinic by Dr Acie Fredrickson. PMH is significant for CAD s/p MI, CHF with most recent EF 35-40% on 04/28/21 echo, ICD placement, HLD, HTN, and obesity. Pt had ICD shock at home due to v tach and underwent ischemic evaluation with [facility] 04/28/21 that showed 100% stenosis of prox LAD with collateral flow from RCA, no intervention was done. She was started on ezetimibe and referred to lipid clinic at discharge. ? ?Pt presents today with her daughter. Has been tolerating ezetimibe well for the past month, still taking pravastatin as well which she tolerates. Previously took rosuvastatin, atorvastatin, and simvastatin, all of which caused knee pain and aches. Doesn't recall how long it took for onset of symptoms, but her symptoms did resolve when she stopped her statin. Takes BP meds ~9am, hasn't taken yet this AM. Reports she skips Lasix and potassium if her SBP is < 120, denies LE edema. Feels better with SBP in the 130s, worse if it's in the 120s. Not fasting today, has had oatmeal. Entresto cost has been a bit high. ? ?Current Medications: pravastatin 40mg  daily, ezetimibe 10mg  daily ?Intolerances: rosuvastatin 20mg  daily, atorvastatin 40mg  daily, simvastatin - knee pain ?Risk Factors: CAD s/p MI, CHF, HTN, age ?LDL goal: 55mg /dL ? ?Diet: low sodium ? ?Exercise: walking more, leg exercises with PT ? ?Family History: The patient's family history includes Aneurysm in her brother; Coronary artery disease in her mother; Heart attack (age of onset: 7) in her mother; Hypertension in her father.  ? ?Social History: The patient  reports that she has never smoked. She has never used smokeless tobacco. She reports that she does not drink alcohol and does not use drugs.  ? ?Labs: ?01/28/21: TC 146, TG 91, HDL 44, LDL 85 (pravastatin 40mg  daily) ? ?Past Medical History:   ?Diagnosis Date  ? AICD (automatic cardioverter/defibrillator) present 12/08/2014  ? CAD (coronary artery disease)   ? CHRONICALLY OCCLUDED LAD WITH A LARGE ANTERIOR WALL MI   ? CHF (congestive heart failure) ([place])   ? EF 30-40%  ? Dyslipidemia   ? Hypertension   ? ICD (implantable cardiac defibrillator), single, in situ   ? MEDTRONIC  ? Ischemic cardiomyopathy   ? Myocardial infarction [facility])   ? ANTERIOR WALL  ? ? ?Current Outpatient Medications on File Prior to Visit  ?Medication Sig Dispense Refill  ? acetaminophen (TYLENOL) 650 MG CR tablet Take 1,300 mg by mouth every 8 (eight) hours as needed for pain.    ? amiodarone (PACERONE) 200 MG tablet Take 2 tablets (400 mg total) twice daily x 7 days, then 2 tablets (400 mg total) once daily x 7 days, then 1 tablet (200 mg total) daily chronically. 56 tablet 3  ? aspirin EC 81 MG tablet Take 81 mg by mouth daily.    ? carvedilol (COREG) 25 MG tablet Take 1 tablet (25 mg total) by mouth 2 (two) times daily with a meal. 180 tablet 3  ? cetirizine (ZYRTEC) 10 MG tablet Take 10 mg by mouth daily as needed for allergies. (Patient not taking: Reported on 04/22/2021)    ? Cholecalciferol (VITAMIN D) 2000 UNITS CAPS Take 1 capsule (2,000 Units  total) by mouth daily. 30 capsule   ? citalopram (CELEXA) 40 MG tablet Take 20 mg by mouth daily.    ? ezetimibe (ZETIA) 10 MG tablet Take 1 tablet (10 mg total) by mouth daily. 90 tablet 3  ? fexofenadine (ALLEGRA) 180 MG tablet Take 180 mg by mouth daily.    ? furosemide (LASIX) 40 MG tablet Take 1 tablet (40 mg total) by mouth every Monday, Wednesday, and Friday. 30 tablet 4  ? mirtazapine (REMERON) 15 MG tablet Take 15 mg by mouth at bedtime as needed (sleep).    ? montelukast (SINGULAIR) 10 MG tablet Take 10 mg by mouth at bedtime.  0  ? potassium chloride (KLOR-CON M) 10 MEQ tablet Take 1 tablet (10 mEq total) by mouth daily. 90 tablet 0  ? pravastatin (PRAVACHOL) 40 MG tablet Take 1 tablet (40 mg total) by mouth every evening. 90  tablet 3  ? predniSONE (DELTASONE) 20 MG tablet Take 40 mg by mouth See admin instructions. 40 mg  qd x 3 days, 20 mg qd x 3 days, 10 mg qd x 3 days, then stop (Patient not taking: Reported on 04/27/2021)    ? sacubitril-valsartan (ENTRESTO) 24-26 MG Take 1 tablet by mouth 2 (two) times daily. 60 tablet 5  ? vitamin B-12 (CYANOCOBALAMIN) 100 MCG tablet Take 100 mcg by mouth daily.    ? ?No current facility-administered medications on file prior to visit.  ? ? ?Allergies  ?Allergen Reactions  ? Codeine Nausea And Vomiting  ? Crestor [Rosuvastatin] Other (See Comments)  ?  Muscle aches  ? Doxycycline Nausea Only  ? Ecotrin [Aspirin] Other (See Comments)  ?  BLEEDING ULCER - ONLY IN HIGH DOSES  ? Lipitor [Atorvastatin] Other (See Comments)  ?  Muscle aches  ? Nsaids Other (See Comments)  ?  BLEEDING ULCER  ? Penicillins Rash  ? Sulfa Drugs Cross Reactors Nausea And Vomiting  ? Vitamin C Other (See Comments)  ?  Upsets stomach  ? Zocor [Simvastatin] Other (See Comments)  ?  Muscle aches  ? ? ?Assessment/Plan: ? ?1. Hyperlipidemia - LDL previously 85 on pravastatin 40mg  daily. She has now been taking ezetimibe 10mg  daily as well for the past month, will check updated lipids today. Of note, pt is not fasting so direct LDL was added. Can increase pravastatin to 80mg  daily if LDL remains above goal on labs from today, she's previously intolerant to 3 statins. Has been focusing on her diet and exercise more lately. ? ?Minh Roanhorse E. Kinser Fellman, PharmD, BCACP, CPP ?[id number] [place], [place] [address] ?Phone: [phone]; Fax: [phone] ?05/31/2021 8:58 AM ? ? ? ?

## 2021-05-31 ENCOUNTER — Ambulatory Visit: Payer: Medicare Other | Admitting: Student

## 2021-05-31 ENCOUNTER — Encounter: Payer: Self-pay | Admitting: Pharmacist

## 2021-05-31 ENCOUNTER — Other Ambulatory Visit: Payer: Self-pay

## 2021-05-31 ENCOUNTER — Ambulatory Visit (INDEPENDENT_AMBULATORY_CARE_PROVIDER_SITE_OTHER): Payer: Medicare Other | Admitting: Pharmacist

## 2021-05-31 ENCOUNTER — Ambulatory Visit (INDEPENDENT_AMBULATORY_CARE_PROVIDER_SITE_OTHER): Payer: Medicare Other

## 2021-05-31 VITALS — BP 124/66 | HR 76 | Ht 62.0 in | Wt 254.6 lb

## 2021-05-31 VITALS — BP 112/66 | HR 83 | Wt 255.0 lb

## 2021-05-31 DIAGNOSIS — I5022 Chronic systolic (congestive) heart failure: Secondary | ICD-10-CM | POA: Diagnosis not present

## 2021-05-31 DIAGNOSIS — E785 Hyperlipidemia, unspecified: Secondary | ICD-10-CM

## 2021-05-31 DIAGNOSIS — I255 Ischemic cardiomyopathy: Secondary | ICD-10-CM | POA: Diagnosis not present

## 2021-05-31 DIAGNOSIS — I472 Ventricular tachycardia, unspecified: Secondary | ICD-10-CM

## 2021-05-31 LAB — CUP PACEART INCLINIC DEVICE CHECK
Battery Remaining Longevity: 59 mo
Battery Voltage: 2.99 V
Brady Statistic RV Percent Paced: 0 %
Date Time Interrogation Session: 20230313091634
HighPow Impedance: 91 Ohm
Implantable Lead Implant Date: 20160919
Implantable Lead Location: 753860
Implantable Lead Model: 6935
Implantable Pulse Generator Implant Date: 20160919
Lead Channel Impedance Value: 456 Ohm
Lead Channel Impedance Value: 532 Ohm
Lead Channel Pacing Threshold Amplitude: 0.75 V
Lead Channel Pacing Threshold Pulse Width: 0.4 ms
Lead Channel Sensing Intrinsic Amplitude: 12.75 mV
Lead Channel Sensing Intrinsic Amplitude: 15 mV
Lead Channel Setting Pacing Amplitude: 2 V
Lead Channel Setting Pacing Pulse Width: 0.4 ms
Lead Channel Setting Sensing Sensitivity: 0.3 mV

## 2021-05-31 LAB — CUP PACEART REMOTE DEVICE CHECK
Battery Remaining Longevity: 59 mo
Battery Voltage: 3 V
Brady Statistic RV Percent Paced: 0 %
Date Time Interrogation Session: 20230313012406
HighPow Impedance: 95 Ohm
Implantable Lead Implant Date: 20160919
Implantable Lead Location: 753860
Implantable Lead Model: 6935
Implantable Pulse Generator Implant Date: 20160919
Lead Channel Impedance Value: 456 Ohm
Lead Channel Impedance Value: 532 Ohm
Lead Channel Pacing Threshold Amplitude: 0.75 V
Lead Channel Pacing Threshold Pulse Width: 0.4 ms
Lead Channel Sensing Intrinsic Amplitude: 10.25 mV
Lead Channel Sensing Intrinsic Amplitude: 10.25 mV
Lead Channel Setting Pacing Amplitude: 2 V
Lead Channel Setting Pacing Pulse Width: 0.4 ms
Lead Channel Setting Sensing Sensitivity: 0.3 mV

## 2021-05-31 LAB — COMPREHENSIVE METABOLIC PANEL
ALT: 10 IU/L (ref 0–32)
AST: 13 IU/L (ref 0–40)
Albumin/Globulin Ratio: 1.5 (ref 1.2–2.2)
Albumin: 3.7 g/dL (ref 3.7–4.7)
Alkaline Phosphatase: 67 IU/L (ref 44–121)
BUN/Creatinine Ratio: 13 (ref 12–28)
BUN: 11 mg/dL (ref 8–27)
Bilirubin Total: 0.3 mg/dL (ref 0.0–1.2)
CO2: 26 mmol/L (ref 20–29)
Calcium: 8.9 mg/dL (ref 8.7–10.3)
Chloride: 106 mmol/L (ref 96–106)
Creatinine, Ser: 0.88 mg/dL (ref 0.57–1.00)
Globulin, Total: 2.4 g/dL (ref 1.5–4.5)
Glucose: 102 mg/dL — ABNORMAL HIGH (ref 70–99)
Potassium: 4.2 mmol/L (ref 3.5–5.2)
Sodium: 143 mmol/L (ref 134–144)
Total Protein: 6.1 g/dL (ref 6.0–8.5)
eGFR: 70 mL/min/{1.73_m2} (ref >59)

## 2021-05-31 LAB — LDL CHOLESTEROL, DIRECT: LDL Direct: 73 mg/dL (ref 0–99)

## 2021-05-31 LAB — LIPID PANEL
Chol/HDL Ratio: 3.5 ratio (ref 0.0–4.4)
Cholesterol, Total: 133 mg/dL (ref 100–199)
HDL: 38 mg/dL — ABNORMAL LOW (ref >39)
LDL Chol Calc (NIH): 76 mg/dL (ref 0–99)
Triglycerides: 99 mg/dL (ref 0–149)
VLDL Cholesterol Cal: 19 mg/dL (ref 5–40)

## 2021-05-31 LAB — TSH: TSH: 2.67 u[IU]/mL (ref 0.450–4.500)

## 2021-05-31 NOTE — Patient Instructions (Signed)
Medication Instructions:  Your physician recommends that you continue on your current medications as directed. Please refer to the Current Medication list given to you today.  *If you need a refill on your cardiac medications before your next appointment, please call your pharmacy*   Lab Work: None If you have labs (blood work) drawn today and your tests are completely normal, you will receive your results only by: MyChart Message (if you have MyChart) OR A paper copy in the mail If you have any lab test that is abnormal or we need to change your treatment, we will call you to review the results.   Follow-Up: At [facility], you and your health needs are our priority.  As part of our continuing mission to provide you with exceptional heart care, we have created designated Provider Care Teams.  These Care Teams include your primary Cardiologist (physician) and Advanced Practice Providers (APPs -  Physician Assistants and Nurse Practitioners) who all work together to provide you with the care you need, when you need it.  Your next appointment:   3 month(s)  The format for your next appointment:   In Person  Provider:   Cameron Lambert, MD{     

## 2021-05-31 NOTE — Patient Instructions (Signed)
It was nice to see you today ? ?Your LDL is 85 and your goal is < 55 ? ?We will recheck this today since you've been taking ezetimibe (Zetia) for the past few months. Continue taking this and your pravastatin for your cholesterol ? ?If your LDL remains above goal on your labs, we would plan to increase your pravastatin dose ? ?

## 2021-06-01 ENCOUNTER — Telehealth: Payer: Self-pay | Admitting: Pharmacist

## 2021-06-01 DIAGNOSIS — E785 Hyperlipidemia, unspecified: Secondary | ICD-10-CM

## 2021-06-01 MED ORDER — PRAVASTATIN SODIUM 80 MG PO TABS: 80.0000 mg | ORAL_TABLET | Freq: Every evening | ORAL | 3 refills | Status: DC

## 2021-06-01 NOTE — Telephone Encounter (Signed)
LDL dropped from 85 to 73 (direct) after adding ezetimibe. Remains above goal < 55. Will increase pravastatin from 40mg  to 80mg  daily and continue ezetimibe. Recheck lipids in 3 months. Pt aware of results and med plan. ?

## 2021-06-02 ENCOUNTER — Other Ambulatory Visit (INDEPENDENT_AMBULATORY_CARE_PROVIDER_SITE_OTHER): Payer: Medicare Other

## 2021-06-02 DIAGNOSIS — I472 Ventricular tachycardia, unspecified: Secondary | ICD-10-CM

## 2021-06-02 DIAGNOSIS — I255 Ischemic cardiomyopathy: Secondary | ICD-10-CM

## 2021-06-03 ENCOUNTER — Ambulatory Visit: Payer: Medicare Other | Admitting: Nurse Practitioner

## 2021-06-11 NOTE — Progress Notes (Signed)
Remote ICD transmission.   

## 2021-06-23 ENCOUNTER — Encounter: Payer: Self-pay | Admitting: Cardiovascular Disease

## 2021-07-27 ENCOUNTER — Other Ambulatory Visit: Payer: Self-pay | Admitting: Student

## 2021-07-28 ENCOUNTER — Other Ambulatory Visit: Payer: Self-pay

## 2021-07-28 NOTE — Telephone Encounter (Signed)
[name] is requesting a refill on furosemide 40 mg tablet, but pt states that she takes this medication daily. Would Dr. Acie Fredrickson like to reorder this medication for pt to take daily? Please address ?

## 2021-07-29 MED ORDER — FUROSEMIDE 40 MG PO TABS: 40.0000 mg | ORAL_TABLET | Freq: Every day | ORAL | 3 refills | Status: DC

## 2021-07-29 NOTE — Telephone Encounter (Signed)
May 10, 2021 ?[name], Deloris Ping, MD ?to [name]   ?  11:40 AM ?Hi Tina, ?  ?Thanks for the follow up. ?Yes, daily lasix and potassium will be fine.  ?We reduced the dose to M/W/F to make sure she did not become hypotensive after starting the [facility]. ?She will need a basic metabolic profile in several weeks to make sure her renal function and potassium levels are staying ok ?Thanks ?Phil [name]  ? ?Medication refill sent to pharmacy for QD Furosemide 40mg  at this time. Verified that pt is currently prescribed QD potassium as well. ?

## 2021-08-03 ENCOUNTER — Other Ambulatory Visit: Payer: Self-pay | Admitting: Cardiovascular Disease

## 2021-08-30 ENCOUNTER — Ambulatory Visit (INDEPENDENT_AMBULATORY_CARE_PROVIDER_SITE_OTHER): Payer: Medicare Other

## 2021-08-30 DIAGNOSIS — I255 Ischemic cardiomyopathy: Secondary | ICD-10-CM

## 2021-08-31 LAB — CUP PACEART REMOTE DEVICE CHECK
Battery Remaining Longevity: 65 mo
Battery Voltage: 3 V
Brady Statistic RV Percent Paced: 0.01 %
Date Time Interrogation Session: 20230612012504
HighPow Impedance: 94 Ohm
Implantable Lead Implant Date: 20160919
Implantable Lead Location: 753860
Implantable Lead Model: 6935
Implantable Pulse Generator Implant Date: 20160919
Lead Channel Impedance Value: 456 Ohm
Lead Channel Impedance Value: 532 Ohm
Lead Channel Pacing Threshold Amplitude: 0.75 V
Lead Channel Pacing Threshold Pulse Width: 0.4 ms
Lead Channel Sensing Intrinsic Amplitude: 9.5 mV
Lead Channel Sensing Intrinsic Amplitude: 9.5 mV
Lead Channel Setting Pacing Amplitude: 2 V
Lead Channel Setting Pacing Pulse Width: 0.4 ms
Lead Channel Setting Sensing Sensitivity: 0.3 mV

## 2021-09-08 ENCOUNTER — Encounter: Payer: Medicare Other | Admitting: Cardiology

## 2021-09-08 ENCOUNTER — Other Ambulatory Visit: Payer: Medicare Other

## 2021-09-13 ENCOUNTER — Other Ambulatory Visit: Payer: Medicare Other

## 2021-09-13 DIAGNOSIS — E785 Hyperlipidemia, unspecified: Secondary | ICD-10-CM

## 2021-09-13 LAB — LIPID PANEL
Chol/HDL Ratio: 3.1 ratio (ref 0.0–4.4)
Cholesterol, Total: 158 mg/dL (ref 100–199)
HDL: 51 mg/dL (ref >39)
LDL Chol Calc (NIH): 88 mg/dL (ref 0–99)
Triglycerides: 102 mg/dL (ref 0–149)
VLDL Cholesterol Cal: 19 mg/dL (ref 5–40)

## 2021-09-13 LAB — HEPATIC FUNCTION PANEL
ALT: 14 IU/L (ref 0–32)
AST: 13 IU/L (ref 0–40)
Albumin: 3.8 g/dL (ref 3.7–4.7)
Alkaline Phosphatase: 72 IU/L (ref 44–121)
Bilirubin Total: 0.3 mg/dL (ref 0.0–1.2)
Bilirubin, Direct: 0.12 mg/dL (ref 0.00–0.40)
Total Protein: 5.9 g/dL — ABNORMAL LOW (ref 6.0–8.5)

## 2021-09-13 LAB — LDL CHOLESTEROL, DIRECT: LDL Direct: 94 mg/dL (ref 0–99)

## 2021-09-22 NOTE — Progress Notes (Signed)
Remote ICD transmission.   

## 2021-10-27 ENCOUNTER — Telehealth: Payer: Self-pay | Admitting: Cardiovascular Disease

## 2021-10-27 ENCOUNTER — Telehealth: Payer: Self-pay | Admitting: *Deleted

## 2021-10-27 NOTE — Telephone Encounter (Signed)
Tele appt 11/04/21 @ 9 am . Med rec and consent are done.Marland Kitchen

## 2021-10-27 NOTE — Telephone Encounter (Signed)
Tele appt 11/04/21 @ 9 am . Med rec and consent are done..      Patient Consent for Virtual Visit        [name] has provided verbal consent on 10/27/2021 for a virtual visit (video or telephone).   CONSENT FOR VIRTUAL VISIT FOR:  [name]  By participating in this virtual visit I agree to the following:  I hereby voluntarily request, consent and authorize [facility] and its employed or contracted physicians, physician assistants, nurse practitioners or other licensed health care professionals (the Practitioner), to provide me with telemedicine health care services (the "Services") as deemed necessary by the treating Practitioner. I acknowledge and consent to receive the Services by the Practitioner via telemedicine. I understand that the telemedicine visit will involve communicating with the Practitioner through live audiovisual communication technology and the disclosure of certain medical information by electronic transmission. I acknowledge that I have been given the opportunity to request an in-person assessment or other available alternative prior to the telemedicine visit and am voluntarily participating in the telemedicine visit.  I understand that I have the right to withhold or withdraw my consent to the use of telemedicine in the course of my care at any time, without affecting my right to future care or treatment, and that the Practitioner or I may terminate the telemedicine visit at any time. I understand that I have the right to inspect all information obtained and/or recorded in the course of the telemedicine visit and may receive copies of available information for a reasonable fee.  I understand that some of the potential risks of receiving the Services via telemedicine include:  Delay or interruption in medical evaluation due to technological equipment failure or disruption; Information transmitted may not be sufficient (e.g. poor resolution of images) to allow for  appropriate medical decision making by the Practitioner; and/or  In rare instances, security protocols could fail, causing a breach of personal health information.  Furthermore, I acknowledge that it is my responsibility to provide information about my medical history, conditions and care that is complete and accurate to the best of my ability. I acknowledge that Practitioner's advice, recommendations, and/or decision may be based on factors not within their control, such as incomplete or inaccurate data provided by me or distortions of diagnostic images or specimens that may result from electronic transmissions. I understand that the practice of medicine is not an exact science and that Practitioner makes no warranties or guarantees regarding treatment outcomes. I acknowledge that a copy of this consent can be made available to me via my patient portal [facility] MyChart), or I can request a printed copy by calling the office of [facility].    I understand that my insurance will be billed for this visit.   I have read or had this consent read to me. I understand the contents of this consent, which adequately explains the benefits and risks of the Services being provided via telemedicine.  I have been provided ample opportunity to ask questions regarding this consent and the Services and have had my questions answered to my satisfaction. I give my informed consent for the services to be provided through the use of telemedicine in my medical care

## 2021-10-27 NOTE — Telephone Encounter (Signed)
   Pre-operative Risk Assessment    Patient Name: [name]  DOB: May 27, 1950 MRN: [id number]      Request for Surgical Clearance    Procedure:   Total hysterectomy   Date of Surgery:  Clearance 11/09/21                                 Surgeon:  Dr. Sharyne Richters Surgeon's Group or Practice Name:  [facility] Phone number:  [phone] Fax number:  [phone]   Type of Clearance Requested:   - Medical    Type of Anesthesia:  General    Additional requests/questions:    Alben Spittle   10/27/2021, 2:00 PM

## 2021-10-27 NOTE — Telephone Encounter (Signed)
Primary Cardiologist:Philip [name], MD   Preoperative team, please contact this patient and set up a phone call appointment for further preoperative risk assessment. Please obtain consent and complete medication review. Thank you for your help.   I confirm that guidance regarding antiplatelet and oral anticoagulation therapy has been completed and, if necessary, noted below. No holds were requested however patient is on aspirin. Last cardiac cath 04/2021 revealed total occlusion of LAD with collateral circulation, no PCI performed. APP to assess for no further symptoms/changes since last office visit.    Levi Aland, NP-C    10/27/2021, 3:20 PM [facility] [address], [address] Office [phone] Fax [phone]

## 2021-11-04 ENCOUNTER — Ambulatory Visit: Payer: Medicare Other | Admitting: Nurse Practitioner

## 2021-11-04 ENCOUNTER — Other Ambulatory Visit: Payer: Self-pay | Admitting: Cardiology

## 2021-11-04 DIAGNOSIS — Z01810 Encounter for preprocedural cardiovascular examination: Secondary | ICD-10-CM

## 2021-11-04 NOTE — Progress Notes (Signed)
Virtual Visit via Telephone Note   Because of [name]'s co-morbid illnesses, she is at least at moderate risk for complications without adequate follow up.  This format is felt to be most appropriate for this patient at this time.  The patient did not have access to video technology/had technical difficulties with video requiring transitioning to audio format only (telephone).  All issues noted in this document were discussed and addressed.  No physical exam could be performed with this format.  Please refer to the patient's chart for her consent to telehealth for [facility].  Evaluation Performed:  Preoperative cardiovascular risk assessment _____________   Date:  11/04/2021   Patient ID:  [name], DOB November 14, 1950, MRN [id number] Patient Location:  Home Provider location:   Office  Primary Care Provider:  Deloris Ping, MD Primary Cardiologist:  Kristeen Miss, MD  Chief Complaint / Patient Profile   71 y.o. y/o female with a h/o CAD (CTO-pLAD), VT arrest , chronic systolic heart failure/ICM s/p ICD, hypertension and hyperlipidemia who is pending total hysterectomy on  11/09/2021 with Dr. Sharyne Richters of [facility] and presents today for telephonic preoperative cardiovascular risk assessment.  Past Medical History    Past Medical History:  Diagnosis Date   AICD (automatic cardioverter/defibrillator) present 12/08/2014   CAD (coronary artery disease)    CHRONICALLY OCCLUDED LAD WITH A LARGE ANTERIOR WALL MI    CHF (congestive heart failure) (HCC)    EF 30-40%   Dyslipidemia    Hypertension    ICD (implantable cardiac defibrillator), single, in situ    MEDTRONIC   Ischemic cardiomyopathy    Myocardial infarction [facility])    ANTERIOR WALL   Past Surgical History:  Procedure Laterality Date   CARDIAC CATHETERIZATION  01/13/05, 06/29/99   CARDIAC DEFIBRILLATOR PLACEMENT  02/21/05   SINGLE/MEDTRONIC   EP IMPLANTABLE DEVICE N/A 11/14/2014    Procedure:  ICD Generator Changeout with new lead & capping old 6949 lead;  Surgeon: Marinus Maw, MD;  [facility] INVASIVE CV LAB;  Service: Cardiovascular;  Laterality: N/A;   EP IMPLANTABLE DEVICE N/A 12/08/2014   Procedure: ICD Implant;  Surgeon: Marinus Maw, MD;  Location: [facility] INVASIVE CV LAB;  Service: Cardiovascular;  Laterality: N/A;   LEFT HEART CATH AND CORONARY ANGIOGRAPHY N/A 04/28/2021   Procedure: LEFT HEART CATH AND CORONARY ANGIOGRAPHY;  Surgeon: Orbie Pyo, MD;  Location: [facility] INVASIVE CV LAB;  Service: Cardiovascular;  Laterality: N/A;    Allergies  Allergies  Allergen Reactions   Codeine Nausea And Vomiting   Crestor [Rosuvastatin] Other (See Comments)    Muscle aches   Doxycycline Nausea Only   Ecotrin [Aspirin] Other (See Comments)    BLEEDING ULCER - ONLY IN HIGH DOSES   Lipitor [Atorvastatin] Other (See Comments)    Muscle aches   Nsaids Other (See Comments)    BLEEDING ULCER   Penicillins Rash   Sulfa Drugs Cross Reactors Nausea And Vomiting   Vitamin C Other (See Comments)    Upsets stomach   Zocor [Simvastatin] Other (See Comments)    Muscle aches    History of Present Illness    [name] is a 71 y.o. female who presents via audio/video conferencing for a telehealth visit today.  Pt was last seen in cardiology clinic on 05/31/2021 by Alejandro Mulling, PA.  At that time JORDY HEWINS was doing well.  The patient is now pending procedure as outlined above. Since her last  visit, she has done well from a cardiac standpoint. She denies chest pain, palpitations, dyspnea, pnd, orthopnea, n, v, dizziness, syncope, edema, weight gain, or early satiety. All other systems reviewed and are otherwise negative except as noted above.   Home Medications    Prior to Admission medications   Medication Sig Start Date End Date Taking? Authorizing Provider  acetaminophen (TYLENOL) 650 MG CR tablet Take 1,300 mg by mouth every 8 (eight) hours as needed for pain.     [provider]  amiodarone (PACERONE) 200 MG tablet Take 1 tablet (200 mg total) by mouth daily. 07/29/21   Graciella Freer, PA-C  aspirin EC 81 MG tablet Take 81 mg by mouth daily.    [provider]  busPIRone (BUSPAR) 5 MG tablet Take 5 mg by mouth 2 (two) times daily as needed.    [provider]  carvedilol (COREG) 25 MG tablet Take 1 tablet (25 mg total) by mouth 2 (two) times daily with a meal. 04/29/21   Jonita Albee, PA-C  Cholecalciferol (VITAMIN D) 2000 UNITS CAPS Take 1 capsule (2,000 Units total) by mouth daily. 09/10/13   [name], Deloris Ping, MD  citalopram (CELEXA) 40 MG tablet Take 20 mg by mouth daily. 07/29/13   [provider]  ezetimibe (ZETIA) 10 MG tablet Take 1 tablet (10 mg total) by mouth daily. 04/29/21   Jonita Albee, PA-C  fexofenadine (ALLEGRA) 180 MG tablet Take 180 mg by mouth daily.    [provider]  furosemide (LASIX) 40 MG tablet Take 1 tablet (40 mg total) by mouth daily. 07/29/21   [name], Deloris Ping, MD  mirtazapine (REMERON) 15 MG tablet Take 15 mg by mouth at bedtime as needed (sleep). 07/05/19   [provider]  montelukast (SINGULAIR) 10 MG tablet Take 10 mg by mouth at bedtime. 08/27/14   [provider]  potassium chloride (KLOR-CON) 10 MEQ tablet Take 1 tablet by mouth once daily 08/04/21   [name], Deloris Ping, MD  pravastatin (PRAVACHOL) 80 MG tablet Take 1 tablet (80 mg total) by mouth every evening. 06/01/21   [name], Deloris Ping, MD  sacubitril-valsartan (ENTRESTO) 24-26 MG Take 1 tablet by mouth 2 (two) times daily. 04/29/21   Jonita Albee, PA-C  vitamin B-12 (CYANOCOBALAMIN) 100 MCG tablet Take 100 mcg by mouth daily.    [provider]    Physical Exam    Vital Signs:  JOYLENE WESCOTT does not have vital signs available for review today.  Given telephonic nature of communication, physical exam is limited. AAOx3. NAD. Normal affect.  Speech and respirations are  unlabored.  Accessory Clinical Findings    None  Assessment & Plan    1.  Preoperative Cardiovascular Risk Assessment:  According to the Revised Cardiac Risk Index (RCRI), her Perioperative Risk of Major Cardiac Event is (%): 6.6. Her Functional Capacity in METs is: 6.45 according to the Duke Activity Status Index (DASI).Therefore, based on ACC/AHA guidelines, patient would be at acceptable risk for the planned procedure without further cardiovascular testing.   A copy of this note will be routed to requesting surgeon.  Time:   Today, I have spent 6 minutes with the patient with telehealth technology discussing medical history, symptoms, and management plan.     Joylene Grapes, NP  11/04/2021, 9:16 AM

## 2021-11-05 ENCOUNTER — Other Ambulatory Visit: Payer: Self-pay

## 2021-11-05 MED ORDER — SACUBITRIL-VALSARTAN 24-26 MG PO TABS: 1.0000 | ORAL_TABLET | Freq: Two times a day (BID) | ORAL | 3 refills | Status: DC

## 2021-11-05 NOTE — Telephone Encounter (Signed)
Pt's medication was sent to pt's pharmacy as requested. Confirmation received.  °

## 2021-11-09 DIAGNOSIS — Z90710 Acquired absence of both cervix and uterus: Secondary | ICD-10-CM

## 2021-11-09 HISTORY — DX: Acquired absence of both cervix and uterus: Z90.710

## 2021-11-12 ENCOUNTER — Encounter: Payer: Medicare Other | Admitting: Cardiology

## 2021-11-29 ENCOUNTER — Ambulatory Visit (INDEPENDENT_AMBULATORY_CARE_PROVIDER_SITE_OTHER): Payer: Medicare Other

## 2021-11-29 DIAGNOSIS — I255 Ischemic cardiomyopathy: Secondary | ICD-10-CM | POA: Diagnosis not present

## 2021-12-01 LAB — CUP PACEART REMOTE DEVICE CHECK
Battery Remaining Longevity: 61 mo
Battery Voltage: 3 V
Brady Statistic RV Percent Paced: 0.01 %
Date Time Interrogation Session: 20230911022703
HighPow Impedance: 90 Ohm
Implantable Lead Implant Date: 20160919
Implantable Lead Location: 753860
Implantable Lead Model: 6935
Implantable Pulse Generator Implant Date: 20160919
Lead Channel Impedance Value: 418 Ohm
Lead Channel Impedance Value: 513 Ohm
Lead Channel Pacing Threshold Amplitude: 0.875 V
Lead Channel Pacing Threshold Pulse Width: 0.4 ms
Lead Channel Sensing Intrinsic Amplitude: 13.75 mV
Lead Channel Sensing Intrinsic Amplitude: 13.75 mV
Lead Channel Setting Pacing Amplitude: 2 V
Lead Channel Setting Pacing Pulse Width: 0.4 ms
Lead Channel Setting Sensing Sensitivity: 0.3 mV

## 2021-12-07 ENCOUNTER — Other Ambulatory Visit: Payer: Self-pay | Admitting: Cardiovascular Disease

## 2021-12-08 ENCOUNTER — Telehealth: Payer: Self-pay | Admitting: *Deleted

## 2021-12-08 NOTE — Telephone Encounter (Signed)
Received a refill request from [facility] for Pravastatin, on there it noted that pt has been taking 40 and not 80.  In records, it was increased in March 2023.  Please advise!

## 2021-12-08 NOTE — Telephone Encounter (Signed)
Megan E Supple, RPH-CPP  06/01/2021  8:12 AM EDT Back to Top    LDL dropped from 85 to 73 (direct) after adding ezetimibe. Remains above goal < 55. Will increase pravastatin from 40mg  to 80mg  daily and continue ezetimibe. Recheck lipids in 3 months. Pt aware of results and med plan.   Spoke with patient's husband who states he went to their [facility] in [place] and was told that they didn't have anything on file for her. Called pharmacy and left detailed message on prescriber line as they were closed for lunch, with below prescription information.  pravastatin (PRAVACHOL) 80 MG tablet [[id number]]    Order Details Dose: 80 mg Route: Oral Frequency: Every evening  Dispense Quantity: 90 tablet Refills: 3   Note to Pharmacy: Dose increase       Sig: Take 1 tablet (80 mg total) by mouth every evening.       Start Date: 06/01/21 End Date: --  Written Date: 06/01/21 Expiration Date: 06/01/22  Original Order:  pravastatin (PRAVACHOL) 40 MG tablet [[id number]]  Providers  Ordering and Authorizing Provider:   Thayer Headings, MD  [place], [place] [address]  Phone:  [phone]   Fax:  [phone]  DEA #:  [id number]   NPI:  [id number]      Ordering User:  Leeroy Bock, [place] [address] [place]  [place], [place] [address]  Phone:  [phone]  Fax:  [phone]  DEA #:  [id number]  [place] Reason: --  Asked that they fill this for patient.

## 2021-12-10 ENCOUNTER — Other Ambulatory Visit: Payer: Self-pay | Admitting: Cardiovascular Disease

## 2021-12-16 NOTE — Progress Notes (Signed)
Remote ICD transmission.   

## 2022-01-20 ENCOUNTER — Encounter: Payer: Self-pay | Admitting: Cardiology

## 2022-01-20 ENCOUNTER — Ambulatory Visit: Payer: Medicare Other | Attending: Cardiology | Admitting: Cardiology

## 2022-01-20 VITALS — BP 118/78 | HR 73 | Ht 62.0 in | Wt 256.0 lb

## 2022-01-20 DIAGNOSIS — I255 Ischemic cardiomyopathy: Secondary | ICD-10-CM

## 2022-01-20 DIAGNOSIS — I5022 Chronic systolic (congestive) heart failure: Secondary | ICD-10-CM

## 2022-01-20 DIAGNOSIS — I4901 Ventricular fibrillation: Secondary | ICD-10-CM

## 2022-01-20 DIAGNOSIS — Z79899 Other long term (current) drug therapy: Secondary | ICD-10-CM

## 2022-01-20 DIAGNOSIS — Z95810 Presence of automatic (implantable) cardiac defibrillator: Secondary | ICD-10-CM | POA: Diagnosis not present

## 2022-01-20 LAB — CUP PACEART INCLINIC DEVICE CHECK
Battery Remaining Longevity: 61 mo
Battery Voltage: 2.99 V
Brady Statistic RV Percent Paced: 0.01 %
Date Time Interrogation Session: 20231102194310
HighPow Impedance: 94 Ohm
Implantable Lead Connection Status: 753985
Implantable Lead Implant Date: 20160919
Implantable Lead Location: 753860
Implantable Lead Model: 6935
Implantable Pulse Generator Implant Date: 20160919
Lead Channel Impedance Value: 456 Ohm
Lead Channel Impedance Value: 513 Ohm
Lead Channel Pacing Threshold Amplitude: 0.875 V
Lead Channel Pacing Threshold Pulse Width: 0.4 ms
Lead Channel Sensing Intrinsic Amplitude: 12.375 mV
Lead Channel Sensing Intrinsic Amplitude: 15.375 mV
Lead Channel Setting Pacing Amplitude: 2 V
Lead Channel Setting Pacing Pulse Width: 0.4 ms
Lead Channel Setting Sensing Sensitivity: 0.3 mV

## 2022-01-20 NOTE — Progress Notes (Deleted)
Electrophysiology Office Follow up Visit Note:    Date:  01/20/2022   ID:  [name], DOB 09/06/1950, MRN [id number]  PCP:  Wayland Salinas, MD  [facility] Cardiologist:  Mertie Moores, MD  [place] Electrophysiologist:  Vickie Epley, MD    Interval History:    [name] is a 71 y.o. female who presents for a follow up visit.   She has a history of ICM, VT/VF s/p ICD. Last saw [name] 05/31/2021.  Remote interrogations have shown stable device function since she saw AT.       Past Medical History:  Diagnosis Date   AICD (automatic cardioverter/defibrillator) present 12/08/2014   CAD (coronary artery disease)    CHRONICALLY OCCLUDED LAD WITH A LARGE ANTERIOR WALL MI    CHF (congestive heart failure) (HCC)    EF 30-40%   Dyslipidemia    H/O total hysterectomy 11/09/2021   Hypertension    ICD (implantable cardiac defibrillator), single, in situ    MEDTRONIC   Ischemic cardiomyopathy    Myocardial infarction [facility])    ANTERIOR WALL    Past Surgical History:  Procedure Laterality Date   CARDIAC CATHETERIZATION  01/13/05, 06/29/99   CARDIAC DEFIBRILLATOR PLACEMENT  02/21/05   SINGLE/MEDTRONIC   EP IMPLANTABLE DEVICE N/A 11/14/2014   Procedure:  ICD Generator Changeout with new lead & capping old 6949 lead;  Surgeon: Evans Lance, MD;  Location: [place] CV LAB;  Service: Cardiovascular;  Laterality: N/A;   EP IMPLANTABLE DEVICE N/A 12/08/2014   Procedure: ICD Implant;  Surgeon: Evans Lance, MD;  Location: [place] CV LAB;  Service: Cardiovascular;  Laterality: N/A;   LEFT HEART CATH AND CORONARY ANGIOGRAPHY N/A 04/28/2021   Procedure: LEFT HEART CATH AND CORONARY ANGIOGRAPHY;  Surgeon: Early Osmond, MD;  Location: [place] CV LAB;  Service: Cardiovascular;  Laterality: N/A;    Current Medications: Current Meds  Medication Sig   acetaminophen (TYLENOL) 650 MG CR tablet Take 1,300 mg by mouth every 8 (eight) hours as needed for pain.    amiodarone (PACERONE) 200 MG tablet Take 1 tablet (200 mg total) by mouth daily.   aspirin EC 81 MG tablet Take 81 mg by mouth daily.   busPIRone (BUSPAR) 5 MG tablet Take 5 mg by mouth 2 (two) times daily as needed.   carvedilol (COREG) 25 MG tablet Take 1 tablet (25 mg total) by mouth 2 (two) times daily with a meal.   Cholecalciferol (VITAMIN D) 2000 UNITS CAPS Take 1 capsule (2,000 Units total) by mouth daily.   citalopram (CELEXA) 40 MG tablet Take 20 mg by mouth daily.   ezetimibe (ZETIA) 10 MG tablet Take 1 tablet (10 mg total) by mouth daily.   fexofenadine (ALLEGRA) 180 MG tablet Take 180 mg by mouth daily.   furosemide (LASIX) 40 MG tablet Take 1 tablet (40 mg total) by mouth daily.   mirtazapine (REMERON) 15 MG tablet Take 15 mg by mouth at bedtime as needed (sleep).   montelukast (SINGULAIR) 10 MG tablet Take 10 mg by mouth at bedtime.   potassium chloride (KLOR-CON) 10 MEQ tablet Take 1 tablet by mouth once daily   pravastatin (PRAVACHOL) 80 MG tablet Take 1 tablet (80 mg total) by mouth every evening.   sacubitril-valsartan (ENTRESTO) 24-26 MG Take 1 tablet by mouth 2 (two) times daily.   vitamin B-12 (CYANOCOBALAMIN) 100 MCG tablet Take 100 mcg by mouth daily.     Allergies:   Codeine, Crestor [  rosuvastatin], Doxycycline, Ecotrin [aspirin], Lipitor [atorvastatin], Nsaids, Penicillins, Sulfa drugs cross reactors, Vitamin c, and Zocor [simvastatin]   Social History   Socioeconomic History   Marital status: Married    Spouse name: Not on file   Number of children: Not on file   Years of education: Not on file   Highest education level: Not on file  Occupational History   Not on file  Tobacco Use   Smoking status: Never   Smokeless tobacco: Never  Vaping Use   Vaping Use: Never used  Substance and Sexual Activity   Alcohol use: No   Drug use: No   Sexual activity: Not on file  Other Topics Concern   Not on file  Social History Narrative   Not on file   Social  Determinants of Health   Financial Resource Strain: Not on file  Food Insecurity: Not on file  Transportation Needs: Not on file  Physical Activity: Not on file  Stress: Not on file  Social Connections: Not on file     Family History: The patient's family history includes Aneurysm in her brother; Coronary artery disease in her mother; Heart attack (age of onset: 74) in her mother; Hypertension in her father.  ROS:   Please see the history of present illness.    All other systems reviewed and are negative.  EKGs/Labs/Other Studies Reviewed:    The following studies were reviewed today:  01/20/2022 In clinic device interrogation personally reviewed ***    Recent Labs: 04/27/2021: B Natriuretic Peptide 60.9; Hemoglobin 14.5; Platelets 190 04/28/2021: Magnesium 2.0 05/31/2021: BUN 11; Creatinine, Ser 0.88; Potassium 4.2; Sodium 143; TSH 2.670 09/13/2021: ALT 14  Recent Lipid Panel    Component Value Date/Time   CHOL 158 09/13/2021 1022   TRIG 102 09/13/2021 1022   HDL 51 09/13/2021 1022   CHOLHDL 3.1 09/13/2021 1022   CHOLHDL 3 11/10/2014 0926   VLDL 18.6 11/10/2014 0926   LDLCALC 88 09/13/2021 1022   LDLDIRECT 94 09/13/2021 1022    Physical Exam:    VS:  BP 118/78   Pulse 73   Ht 5\' 2"  (1.575 m)   Wt 256 lb (116.1 kg)   SpO2 96%   BMI 46.82 kg/m     Wt Readings from Last 3 Encounters:  01/20/22 256 lb (116.1 kg)  05/31/21 254 lb 9.6 oz (115.5 kg)  05/31/21 255 lb (115.7 kg)     GEN: *** Well nourished, well developed in no acute distress HEENT: Normal NECK: No JVD; No carotid bruits LYMPHATICS: No lymphadenopathy CARDIAC: ***RRR, no murmurs, rubs, gallops RESPIRATORY:  Clear to auscultation without rales, wheezing or rhonchi  ABDOMEN: Soft, non-tender, non-distended MUSCULOSKELETAL:  No edema; No deformity  SKIN: Warm and dry NEUROLOGIC:  Alert and oriented x 3 PSYCHIATRIC:  Normal affect        ASSESSMENT:    1. Chronic systolic congestive heart  failure, NYHA class 2 (HCC)   2. Ischemic cardiomyopathy   3. Ventricular fibrillation (HCC)   4. ICD (implantable cardioverter-defibrillator), single, in situ   5. Encounter for long-term (current) use of high-risk medication    PLAN:    In order of problems listed above:  #Chronic systolic hF #ICM #Hx of VF arrest #ICD in situ  NYHA Class II. Warm and dry.  Continue with current medical regimen (coreg, lasix, entresto)  ICD functioning well. Continue remote monitoring.  Follow up 1 year with APP.    Medication Adjustments/Labs and Tests Ordered: Current medicines are reviewed  at length with the patient today.  Concerns regarding medicines are outlined above.  No orders of the defined types were placed in this encounter.  No orders of the defined types were placed in this encounter.    Signed, Lars Mage, MD, [facility], [facility] 01/20/2022 2:27 PM    Electrophysiology [facility]

## 2022-01-20 NOTE — Progress Notes (Signed)
Electrophysiology Office Follow up Visit Note:    Date:  01/20/2022   ID:  [name], DOB 1950-05-25, MRN [id number]  PCP:  Nancy Salinas, MD  [facility] Cardiologist:  Nancy Moores, MD  [place] Electrophysiologist:  Nancy Epley, MD    Interval History:    [name] is a 71 y.o. female who presents for a follow up visit.   She has a history of ICM, VT/VF s/p ICD. Last saw [name] 05/31/2021.  Remote interrogations have shown stable device function since she saw AT.  Today, she states she is feeling good. She has not been aware of any recurring arrhythmias. She denies any issues associated with her ICD.  She denies any palpitations, chest pain, shortness of breath, or peripheral edema. No lightheadedness, headaches, syncope, orthopnea, or PND.      Past Medical History:  Diagnosis Date   AICD (automatic cardioverter/defibrillator) present 12/08/2014   CAD (coronary artery disease)    CHRONICALLY OCCLUDED LAD WITH A LARGE ANTERIOR WALL MI    CHF (congestive heart failure) (HCC)    EF 30-40%   Dyslipidemia    H/O total hysterectomy 11/09/2021   Hypertension    ICD (implantable cardiac defibrillator), single, in situ    MEDTRONIC   Ischemic cardiomyopathy    Myocardial infarction [facility])    ANTERIOR WALL    Past Surgical History:  Procedure Laterality Date   CARDIAC CATHETERIZATION  01/13/05, 06/29/99   CARDIAC DEFIBRILLATOR PLACEMENT  02/21/05   SINGLE/MEDTRONIC   EP IMPLANTABLE DEVICE N/A 11/14/2014   Procedure:  ICD Generator Changeout with new lead & capping old 6949 lead;  Surgeon: Evans Lance, MD;  Location: [place] CV LAB;  Service: Cardiovascular;  Laterality: N/A;   EP IMPLANTABLE DEVICE N/A 12/08/2014   Procedure: ICD Implant;  Surgeon: Evans Lance, MD;  Location: [place] CV LAB;  Service: Cardiovascular;  Laterality: N/A;   LEFT HEART CATH AND CORONARY ANGIOGRAPHY N/A 04/28/2021   Procedure: LEFT HEART CATH AND CORONARY  ANGIOGRAPHY;  Surgeon: Early Osmond, MD;  Location: [place] CV LAB;  Service: Cardiovascular;  Laterality: N/A;    Current Medications: Current Meds  Medication Sig   acetaminophen (TYLENOL) 650 MG CR tablet Take 1,300 mg by mouth every 8 (eight) hours as needed for pain.   amiodarone (PACERONE) 200 MG tablet Take 1 tablet (200 mg total) by mouth daily.   aspirin EC 81 MG tablet Take 81 mg by mouth daily.   busPIRone (BUSPAR) 5 MG tablet Take 5 mg by mouth 2 (two) times daily as needed.   carvedilol (COREG) 25 MG tablet Take 1 tablet (25 mg total) by mouth 2 (two) times daily with a meal.   Cholecalciferol (VITAMIN D) 2000 UNITS CAPS Take 1 capsule (2,000 Units total) by mouth daily.   citalopram (CELEXA) 40 MG tablet Take 20 mg by mouth daily.   ezetimibe (ZETIA) 10 MG tablet Take 1 tablet (10 mg total) by mouth daily.   fexofenadine (ALLEGRA) 180 MG tablet Take 180 mg by mouth daily.   furosemide (LASIX) 40 MG tablet Take 1 tablet (40 mg total) by mouth daily.   mirtazapine (REMERON) 15 MG tablet Take 15 mg by mouth at bedtime as needed (sleep).   montelukast (SINGULAIR) 10 MG tablet Take 10 mg by mouth at bedtime.   potassium chloride (KLOR-CON) 10 MEQ tablet Take 1 tablet by mouth once daily   pravastatin (PRAVACHOL) 80 MG tablet Take 1 tablet (80  mg total) by mouth every evening.   sacubitril-valsartan (ENTRESTO) 24-26 MG Take 1 tablet by mouth 2 (two) times daily.   vitamin B-12 (CYANOCOBALAMIN) 100 MCG tablet Take 100 mcg by mouth daily.     Allergies:   Codeine, Crestor [rosuvastatin], Doxycycline, Ecotrin [aspirin], Lipitor [atorvastatin], Nsaids, Penicillins, Sulfa drugs cross reactors, Vitamin c, and Zocor [simvastatin]   Social History   Socioeconomic History   Marital status: Married    Spouse name: Not on file   Number of children: Not on file   Years of education: Not on file   Highest education level: Not on file  Occupational History   Not on file  Tobacco  Use   Smoking status: Never   Smokeless tobacco: Never  Vaping Use   Vaping Use: Never used  Substance and Sexual Activity   Alcohol use: No   Drug use: No   Sexual activity: Not on file  Other Topics Concern   Not on file  Social History Narrative   Not on file   Social Determinants of Health   Financial Resource Strain: Not on file  Food Insecurity: Not on file  Transportation Needs: Not on file  Physical Activity: Not on file  Stress: Not on file  Social Connections: Not on file     Family History: The patient's family history includes Aneurysm in her brother; Coronary artery disease in her mother; Heart attack (age of onset: 57) in her mother; Hypertension in her father.  ROS:   Please see the history of present illness.    All other systems reviewed and are negative.  EKGs/Labs/Other Studies Reviewed:    The following studies were reviewed today:  01/20/2022 In clinic device interrogation personally reviewed Battery longevity 5.1 years No episodes Lead parametes stable Vvi 40 100% VS    Recent Labs: 04/27/2021: B Natriuretic Peptide 60.9; Hemoglobin 14.5; Platelets 190 04/28/2021: Magnesium 2.0 05/31/2021: BUN 11; Creatinine, Ser 0.88; Potassium 4.2; Sodium 143; TSH 2.670 09/13/2021: ALT 14   Recent Lipid Panel    Component Value Date/Time   CHOL 158 09/13/2021 1022   TRIG 102 09/13/2021 1022   HDL 51 09/13/2021 1022   CHOLHDL 3.1 09/13/2021 1022   CHOLHDL 3 11/10/2014 0926   VLDL 18.6 11/10/2014 0926   LDLCALC 88 09/13/2021 1022   LDLDIRECT 94 09/13/2021 1022    Physical Exam:    VS:  BP 118/78   Pulse 73   Ht _0  (1.575 m)   Wt 256 lb (116.1 kg)   SpO2 96%   BMI 46.82 kg/m     Wt Readings from Last 3 Encounters:  01/20/22 256 lb (116.1 kg)  05/31/21 254 lb 9.6 oz (115.5 kg)  05/31/21 255 lb (115.7 kg)     GEN: Well nourished, well developed in no acute distress. obese HEENT: Normal NECK: No JVD; No carotid bruits LYMPHATICS: No  lymphadenopathy CARDIAC: RRR, no murmurs, rubs, gallops. Prepectoral pocket well healed. RESPIRATORY:  Clear to auscultation without rales, wheezing or rhonchi  ABDOMEN: Soft, non-tender, non-distended MUSCULOSKELETAL:  No edema; No deformity  SKIN: Warm and dry NEUROLOGIC:  Alert and oriented x 3 PSYCHIATRIC:  Normal affect       ASSESSMENT:    1. Chronic systolic congestive heart failure, NYHA class 2 ([place])   2. Ischemic cardiomyopathy   3. Ventricular fibrillation ([place])   4. ICD (implantable cardioverter-defibrillator), single, in situ   5. Encounter for long-term (current) use of high-risk medication    PLAN:  In order of problems listed above:  #Chronic systolic hF #ICM #Hx of VF arrest #ICD in situ  NYHA Class II. Warm and dry.  Continue with current medical regimen (coreg, lasix, entresto)  ICD functioning well. Continue remote monitoring.  #Amiodarone monitoring #VT NO HV therapies. Will repeat CMP, TSH and FT4 today for amio monitoring.  Follow up 6 months with APP.    Medication Adjustments/Labs and Tests Ordered: Current medicines are reviewed at length with the patient today.  Concerns regarding medicines are outlined above.   Orders Placed This Encounter  Procedures   Comp Met (CMET)   TSH   T4, free   No orders of the defined types were placed in this encounter.  I,Mathew Stumpf,acting as a Education administrator for Nancy Epley, MD.,have documented all relevant documentation on the behalf of Nancy Epley, MD,as directed by  Nancy Epley, MD while in the presence of Nancy Epley, MD.  I, Nancy Epley, MD, have reviewed all documentation for this visit. The documentation on 01/20/22 for the exam, diagnosis, procedures, and orders are all accurate and complete.   Signed, Lars Mage, MD, [facility], [facility] 01/20/2022 7:36 PM    Electrophysiology [facility]

## 2022-01-20 NOTE — Patient Instructions (Signed)
Medication Instructions:  None *If you need a refill on your cardiac medications before your next appointment, please call your pharmacy*   Lab Work: CMP, TSH, Free T4 If you have labs (blood work) drawn today and your tests are completely normal, you will receive your results only by: [place] (if you have MyChart) OR A paper copy in the mail If you have any lab test that is abnormal or we need to change your treatment, we will call you to review the results.   Testing/Procedures: none   Follow-Up: At [facility], you and your health needs are our priority.  As part of our continuing mission to provide you with exceptional heart care, we have created designated Provider Care Teams.  These Care Teams include your primary Cardiologist (physician) and Advanced Practice Providers (APPs -  Physician Assistants and Nurse Practitioners) who all work together to provide you with the care you need, when you need it.  We recommend signing up for the patient portal called "MyChart".  Sign up information is provided on this After Visit Summary.  MyChart is used to connect with patients for Virtual Visits (Telemedicine).  Patients are able to view lab/test results, encounter notes, upcoming appointments, etc.  Non-urgent messages can be sent to your provider as well.   To learn more about what you can do with MyChart, go to [email or web address].    Your next appointment:   6 month(s)  The format for your next appointment:   In Person  Provider:   You will see one of the following Advanced Practice Providers on your designated Care Team:   Tommye Standard, [place] Legrand Como "Jonni Sanger" Chalmers Cater, [place]      Other Instructions none  Important Information About Sugar

## 2022-01-21 LAB — COMPREHENSIVE METABOLIC PANEL
ALT: 13 IU/L (ref 0–32)
AST: 15 IU/L (ref 0–40)
Albumin/Globulin Ratio: 1.5 (ref 1.2–2.2)
Albumin: 3.9 g/dL (ref 3.8–4.8)
Alkaline Phosphatase: 67 IU/L (ref 44–121)
BUN/Creatinine Ratio: 8 — ABNORMAL LOW (ref 12–28)
BUN: 9 mg/dL (ref 8–27)
Bilirubin Total: 0.3 mg/dL (ref 0.0–1.2)
CO2: 28 mmol/L (ref 20–29)
Calcium: 9.2 mg/dL (ref 8.7–10.3)
Chloride: 103 mmol/L (ref 96–106)
Creatinine, Ser: 1.09 mg/dL — ABNORMAL HIGH (ref 0.57–1.00)
Globulin, Total: 2.6 g/dL (ref 1.5–4.5)
Glucose: 83 mg/dL (ref 70–99)
Potassium: 4.2 mmol/L (ref 3.5–5.2)
Sodium: 144 mmol/L (ref 134–144)
Total Protein: 6.5 g/dL (ref 6.0–8.5)
eGFR: 54 mL/min/{1.73_m2} — ABNORMAL LOW (ref >59)

## 2022-01-21 LAB — TSH: TSH: 1.42 u[IU]/mL (ref 0.450–4.500)

## 2022-01-21 LAB — T4, FREE: Free T4: 1.51 ng/dL (ref 0.82–1.77)

## 2022-01-24 ENCOUNTER — Other Ambulatory Visit: Payer: Self-pay | Admitting: Cardiology

## 2022-02-28 ENCOUNTER — Ambulatory Visit (INDEPENDENT_AMBULATORY_CARE_PROVIDER_SITE_OTHER): Payer: Medicare Other

## 2022-02-28 DIAGNOSIS — I255 Ischemic cardiomyopathy: Secondary | ICD-10-CM

## 2022-03-01 LAB — CUP PACEART REMOTE DEVICE CHECK
Battery Remaining Longevity: 58 mo
Battery Voltage: 2.99 V
Brady Statistic RV Percent Paced: 0.01 %
Date Time Interrogation Session: 20231211052706
HighPow Impedance: 76 Ohm
Implantable Lead Connection Status: 753985
Implantable Lead Implant Date: 20160919
Implantable Lead Location: 753860
Implantable Lead Model: 6935
Implantable Pulse Generator Implant Date: 20160919
Lead Channel Impedance Value: 418 Ohm
Lead Channel Impedance Value: 513 Ohm
Lead Channel Pacing Threshold Amplitude: 0.75 V
Lead Channel Pacing Threshold Pulse Width: 0.4 ms
Lead Channel Sensing Intrinsic Amplitude: 12.625 mV
Lead Channel Sensing Intrinsic Amplitude: 12.625 mV
Lead Channel Setting Pacing Amplitude: 2 V
Lead Channel Setting Pacing Pulse Width: 0.4 ms
Lead Channel Setting Sensing Sensitivity: 0.3 mV

## 2022-03-28 IMAGING — CR DG CHEST 2V
2 series · 2 of 2 positions shown · non-contrast
Comparison: Previous studies including the examination of
12/09/2014

CLINICAL DATA: Shortness of breath, syncope

EXAM:
CHEST - 2 VIEW

[chest pa]
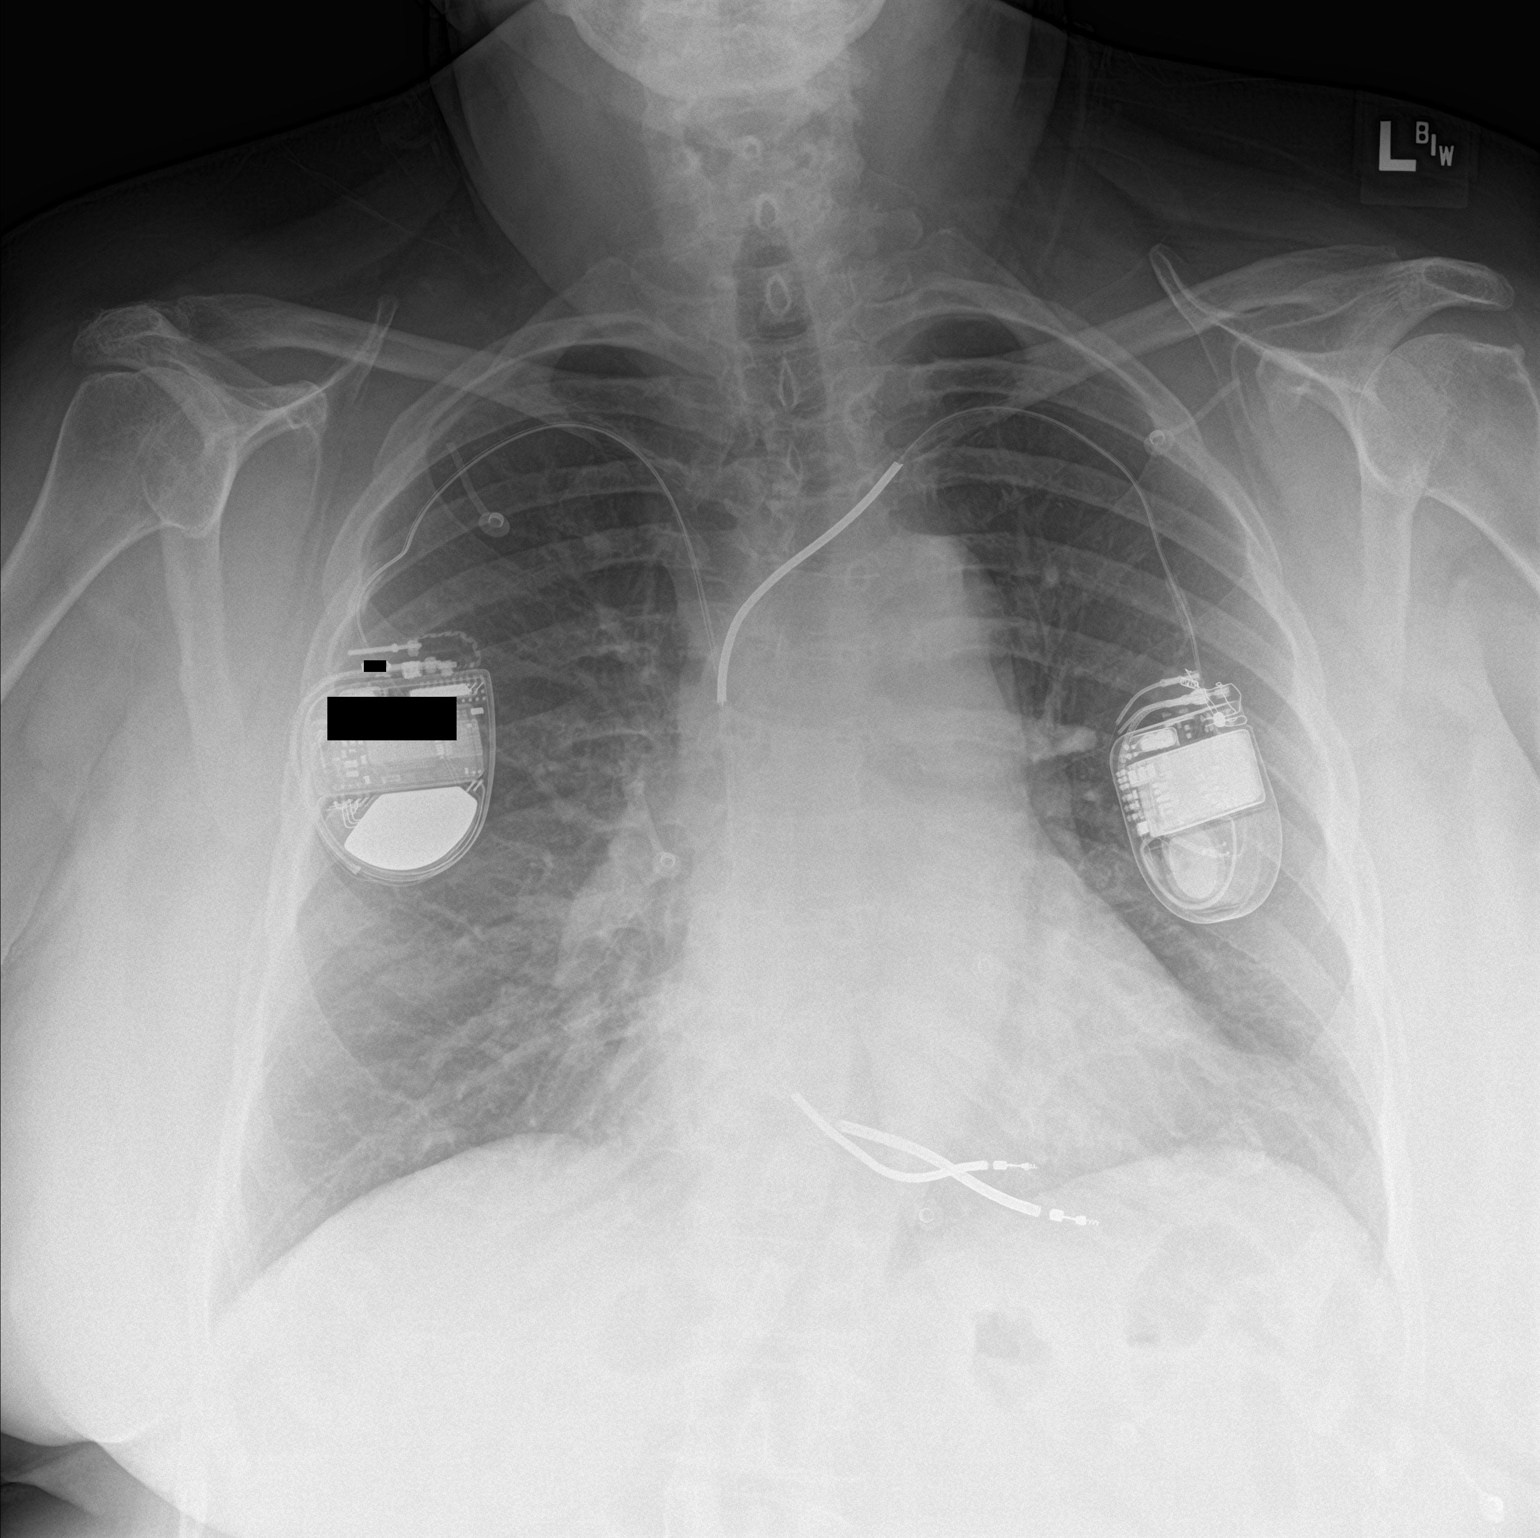

[chest lat]
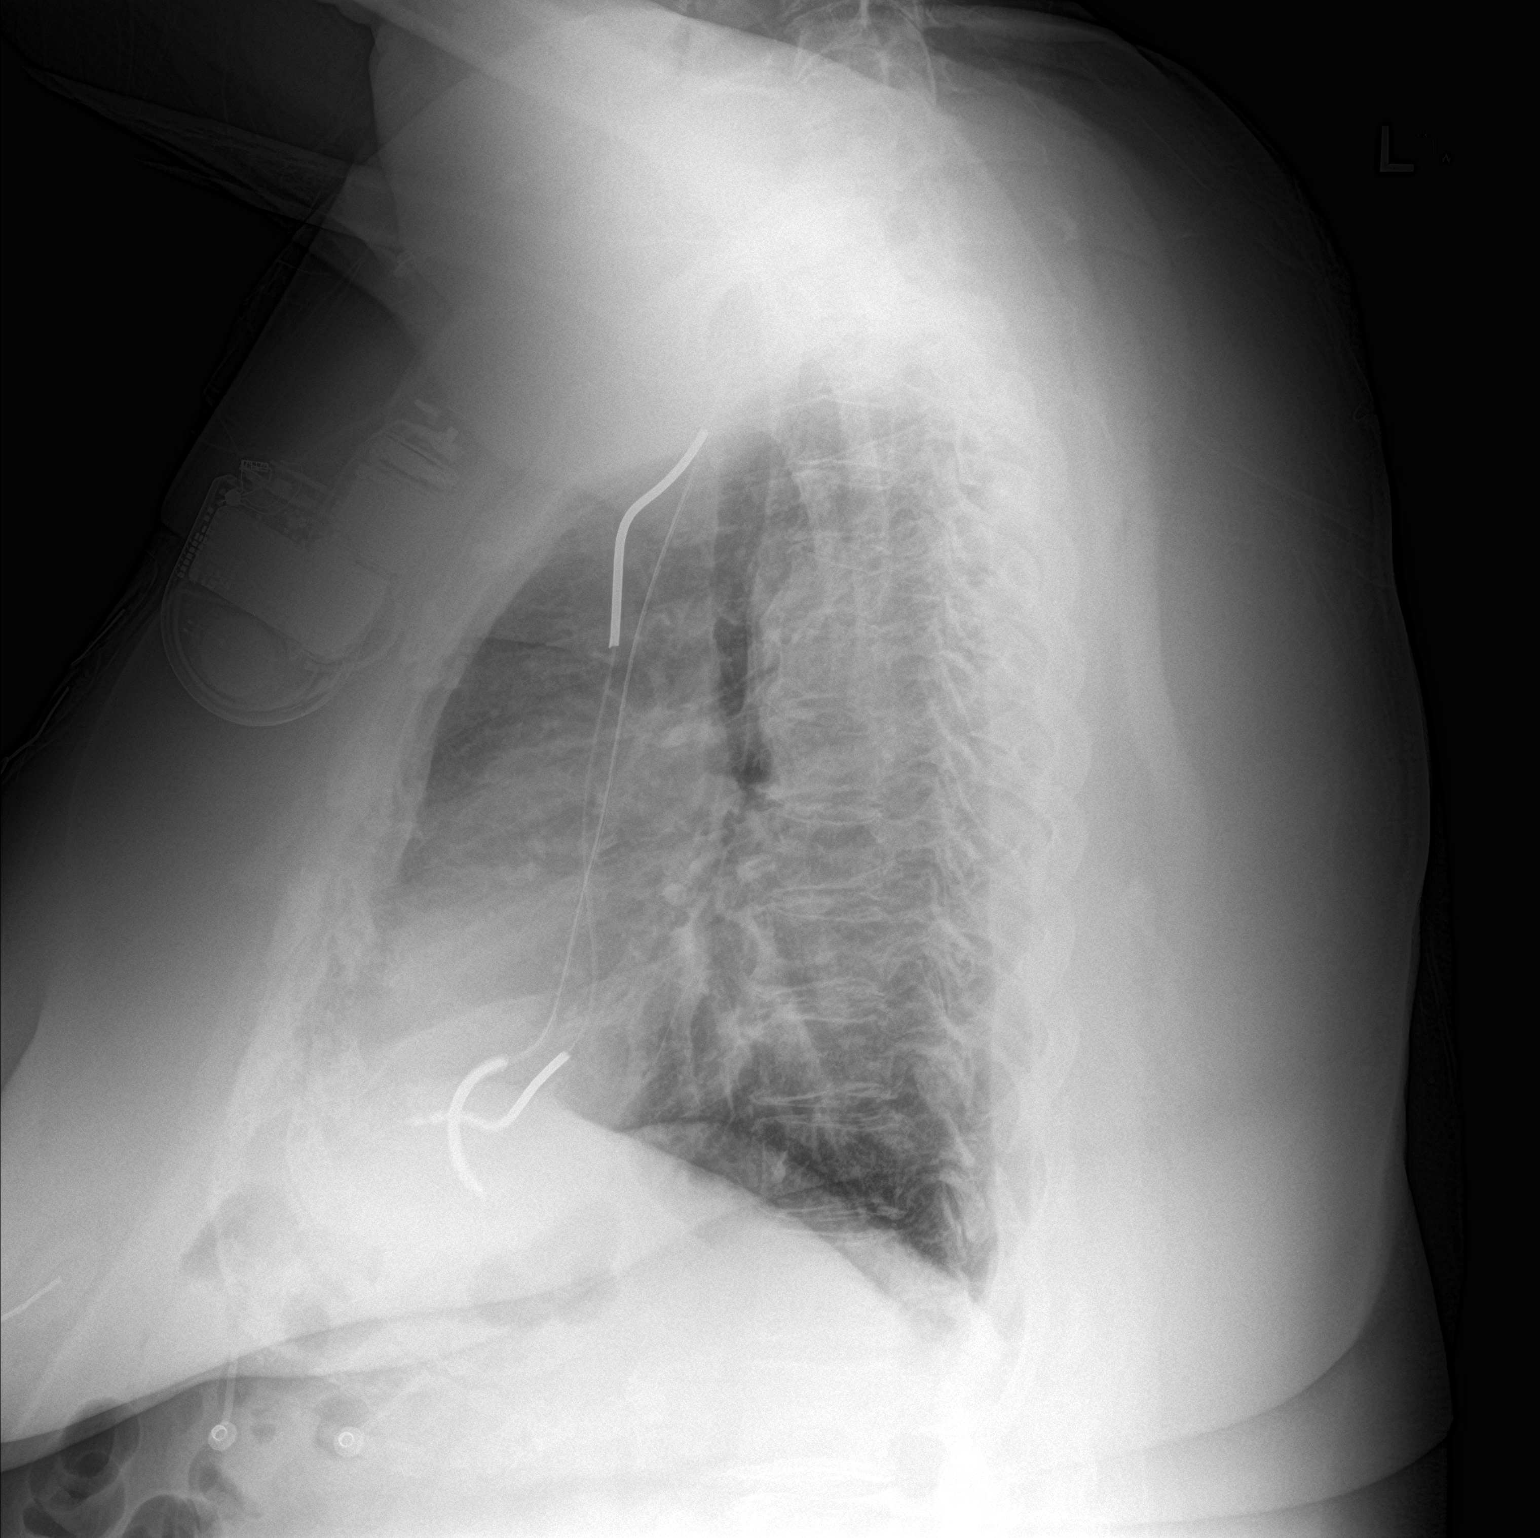

[2 of 2 positions shown; findings below may reference images not displayed]

FINDINGS: Cardiac size is within normal limits. Lung fields are clear of any
infiltrates or pulmonary edema. There is no pleural effusion or
pneumothorax. Pacemaker/defibrillator battery is seen in both
infraclavicular regions.
IMPRESSION: There are no signs of pulmonary edema or focal pulmonary
infiltrates.

## 2022-04-04 NOTE — Progress Notes (Signed)
Remote ICD transmission.   

## 2022-04-11 ENCOUNTER — Other Ambulatory Visit: Payer: Self-pay | Admitting: Cardiovascular Disease

## 2022-04-20 ENCOUNTER — Ambulatory Visit: Payer: Medicare Other | Admitting: Cardiovascular Disease

## 2022-04-27 ENCOUNTER — Other Ambulatory Visit: Payer: Self-pay | Admitting: Cardiovascular Disease

## 2022-05-01 ENCOUNTER — Encounter: Payer: Self-pay | Admitting: Cardiovascular Disease

## 2022-05-01 NOTE — Progress Notes (Unsigned)
Cardiology Office Note   Date:  05/02/2022   ID:  [name], DOB 08-04-1950, MRN [id number]  PCP:  Wayland Salinas, MD  Cardiologist:   Mertie Moores, MD   Chief Complaint  Patient presents with   Coronary Artery Disease   Congestive Heart Failure         1. Coronary artery disease-status post chronic LAD occlusion with a large anterior wall myocardial infarction 2. Congestive heart failure-EF of 30-40% 3. AICD placement 4. Hyperlipidemia 5 obesity  Previous notes:   [name] is doing very well from a cardiac standpoint. She's not had any episodes of chest pain or shortness of breath. She is able to do all of her normal activities without any significant problems.  She has not had much success in losing weight  She denies any syncope or presyncope.  September 14, 2012:  [name]  is doing well. She's not having any episodes of chest pain or shortness of breath.  Her ICD has been interrogated recently and was found to be working properly.   She has no CP or dyspnea when she does her regular activities.  She does not get any regular exercise.   June 13, 2013:  [name] is doing ok.  She is exercising some.   Walks about 20 minutes a day - several days a week.   No CP or dyspnea.     Feb. 25, 2016:  [name] is a 72 y.o. female who presents for follow up of her CAD and CHF.   Her ICD is working well.  She will need to have the battery checked more often recently because  it is nearing ERI.  She is not getting exercise.   Denies any chest pain  Or dyspnea.   Needs to lose weight.  [facility]   November 12, 2014:  Patient is doing well. No cp or dyspnea. Perhaps getting some exercise  Sept. 1, 2017:  Is doing well.  Followed for CHF and ICD placement  Has hx of chronic systlic CHF Breathing is good.  No CP   Oct. 2, 2018:    [name] is seen today for follow up ov her CAD, CHF and ICD placement .    Wt = 243 lbs. ( up 4 lbs since last years  visit )   January 11, 2018:   [name] is seen today for follow-up of her coronary artery disease, congestive heart failure and ICD placement.  Her weight today is 239 pounds. Still eating salty foods   - hot dogs once a week , bacon once month,  Canned veggies weekly .    Nov. 30, 2020:  Has tried to cut out her salt .  Wt is 247 lbs ( up 8 lbs from last year ) No exercise . Especially with covid   Jan. 4, 2022: [name] is seen today for follow up of her CAD, CHF.  Has an AICD  She was seen by Dr. Lovena Le today  Wt. Is 255 lbs. 9 up 8 lbs from last year)  No cp, no dyspnea  Exercised during the summer.  None recently .   Going to start with a program  at [employer] .  Is on Lisinopril - we discussed Entresto  We discussed the washout of lisinopril that is needed.   October 19, 2020:  [name] is doing well from a cardiac standpoint  Wt is 240 lbs. Tolerating the eliquis well ( she  doesn t like the price )  Has noticed an improvement in her breathing   Feb. 2, 2023 Doing well , hx of remote ant. MI  Did well on Entresto but the [place] cost too much  We started Losartan in its place.  Needs to have a hysterectomy  Med list has her taking losartan and Lisinopril She will call us when she gets home to verify that she is not on both meds  She needs to have a hysterectomy.  She is not had any cardiac problems.  She is at low risk for her upcoming hysterectomy.  She may hold her aspirin for 5 to 7 days prior to the surgery.   Feb. 12, 2024 [name] is seen for follow up of her CAD, Ant. MI, CHF Has a history of VF arrest.  She is on amiodarone.  She is tolerating the amiodarone .  She has an ICD.    Past Medical History:  Diagnosis Date   AICD (automatic cardioverter/defibrillator) present 12/08/2014   CAD (coronary artery disease)    CHRONICALLY OCCLUDED LAD WITH A LARGE ANTERIOR WALL MI    CHF (congestive heart failure) (HCC)    EF 30-40%   Dyslipidemia    H/O total hysterectomy  11/09/2021   Hypertension    ICD (implantable cardiac defibrillator), single, in situ    [employer]   Ischemic cardiomyopathy    Myocardial infarction [facility])    ANTERIOR WALL    Past Surgical History:  Procedure Laterality Date   CARDIAC CATHETERIZATION  01/13/05, 06/29/99   CARDIAC DEFIBRILLATOR PLACEMENT  02/21/05   SINGLE/MEDTRONIC   EP IMPLANTABLE DEVICE N/A 11/14/2014   Procedure:  ICD Generator Changeout with new lead & capping old 6949 lead;  Surgeon: Evans Lance, MD;  Location: [place] [facility];  Service: Cardiovascular;  Laterality: N/A;   EP IMPLANTABLE DEVICE N/A 12/08/2014   Procedure: ICD Implant;  Surgeon: Evans Lance, MD;  Location: [place] [facility];  Service: Cardiovascular;  Laterality: N/A;   LEFT HEART CATH AND CORONARY ANGIOGRAPHY N/A 04/28/2021   Procedure: LEFT HEART CATH AND CORONARY ANGIOGRAPHY;  Surgeon: Early Osmond, MD;  Location: [place] [facility];  Service: Cardiovascular;  Laterality: N/A;     Current Outpatient Medications  Medication Sig Dispense Refill   acetaminophen (TYLENOL) 650 MG CR tablet Take 1,300 mg by mouth every 8 (eight) hours as needed for pain.     amiodarone (PACERONE) 200 MG tablet Take 1 tablet (200 mg total) by mouth daily. 90 tablet 3   aspirin EC 81 MG tablet Take 81 mg by mouth daily.     busPIRone (BUSPAR) 5 MG tablet Take 5 mg by mouth 2 (two) times daily as needed.     carvedilol (COREG) 25 MG tablet Take 1 tablet (25 mg total) by mouth 2 (two) times daily with a meal. 180 tablet 2   Cholecalciferol (VITAMIN D) 2000 UNITS CAPS Take 1 capsule (2,000 Units total) by mouth daily. 30 capsule    citalopram (CELEXA) 40 MG tablet Take 20 mg by mouth daily.     ezetimibe (ZETIA) 10 MG tablet Take 1 tablet by mouth once daily 90 tablet 0   fexofenadine (ALLEGRA) 180 MG tablet Take 180 mg by mouth daily.     furosemide (LASIX) 40 MG tablet Take 1 tablet (40 mg total) by mouth daily. 90 tablet 3   mirtazapine (REMERON) 15 MG  tablet Take 15 mg by mouth at bedtime as needed (sleep).  montelukast (SINGULAIR) 10 MG tablet Take 10 mg by mouth at bedtime.  0   potassium chloride (KLOR-CON) 10 MEQ tablet Take 1 tablet by mouth once daily 30 tablet 0   pravastatin (PRAVACHOL) 80 MG tablet Take 1 tablet (80 mg total) by mouth every evening. 90 tablet 3   sacubitril-valsartan (ENTRESTO) 24-26 MG Take 1 tablet by mouth 2 (two) times daily. 180 tablet 3   vitamin B-12 (CYANOCOBALAMIN) 100 MCG tablet Take 100 mcg by mouth daily.     No current facility-administered medications for this visit.    Allergies:   Codeine, Crestor [rosuvastatin], Doxycycline, Ecotrin [aspirin], Lipitor [atorvastatin], Nsaids, Penicillins, Sulfa drugs cross reactors, Vitamin c, and Zocor [simvastatin]    Social History:  The patient  reports that she has never smoked. She has never used smokeless tobacco. She reports that she does not drink alcohol and does not use drugs.   Family History:  The patient's family history includes Aneurysm in her brother; Coronary artery disease in her mother; Heart attack (age of onset: 36) in her mother; Hypertension in her father.    ROS:  Please see the history of present illness.   Physical Exam: Blood pressure 136/76, pulse 60, height 5' 2"$  (1.575 m), weight 252 lb 9.6 oz (114.6 kg), SpO2 92 %.      GEN:  obese, elderly female .  in no acute distress HEENT: Normal NECK: No JVD; No carotid bruits LYMPHATICS: No lymphadenopathy CARDIAC: RRR , no murmurs, rubs, gallops RESPIRATORY:  Clear to auscultation without rales, wheezing or rhonchi  ABDOMEN: Soft, non-tender, non-distended MUSCULOSKELETAL:  No edema; No deformity  SKIN: Warm and dry NEUROLOGIC:  Alert and oriented x 3   EKG: Feb. 12, 2024 :   NSR at 60.   Low voltage  QRS old ant. Mi     Recent Labs: 01/20/2022: ALT 13; BUN 9; Creatinine, Ser 1.09; Potassium 4.2; Sodium 144; TSH 1.420    Lipid Panel    Component Value Date/Time    CHOL 158 09/13/2021 1022   TRIG 102 09/13/2021 1022   HDL 51 09/13/2021 1022   CHOLHDL 3.1 09/13/2021 1022   CHOLHDL 3 11/10/2014 0926   VLDL 18.6 11/10/2014 0926   LDLCALC 88 09/13/2021 1022   LDLDIRECT 94 09/13/2021 1022      Wt Readings from Last 3 Encounters:  05/02/22 252 lb 9.6 oz (114.6 kg)  01/20/22 256 lb (116.1 kg)  05/31/21 254 lb 9.6 oz (115.5 kg)      Other studies Reviewed: Additional studies/ records that were reviewed today include: . Review of the above records demonstrates:    ASSESSMENT AND PLAN:  1. Coronary artery disease-    past ant MI .  No CP currently .  No dyspnea ( does not get much exercise     2. Congestive heart failure-EF of 30-40% -   will increase Entresto to 49-51 BID ,  check BMP in 3 weeks .  She will double up in the current dose ( 2 tabs po BID ) which is aproximately the amount of her  next dose 49-51 mg BID        3. AICD placement -    Per EP   4. Hyperlipidemia -    check lipids today ,  her las LDL was 90.   She may need a higher strength of statin or perhaps PVSK9 inhibitor   5 obesity -  advised weight loss       Current medicines are  reviewed at length with the patient today.  The patient does not have concerns regarding medicines.  The following changes have been made:  no change   Disposition:       Follow up in 1 year with APP or me    Signed, Mertie Moores, MD  05/02/2022 4:37 PM    [address], [place], [place]  [address] Phone: [phone]; Fax: [phone]

## 2022-05-02 ENCOUNTER — Ambulatory Visit: Payer: Medicare PPO | Attending: Cardiovascular Disease | Admitting: Cardiovascular Disease

## 2022-05-02 ENCOUNTER — Encounter: Payer: Self-pay | Admitting: Cardiovascular Disease

## 2022-05-02 VITALS — BP 136/76 | HR 60 | Ht 62.0 in | Wt 252.6 lb

## 2022-05-02 DIAGNOSIS — I5022 Chronic systolic (congestive) heart failure: Secondary | ICD-10-CM

## 2022-05-02 DIAGNOSIS — E785 Hyperlipidemia, unspecified: Secondary | ICD-10-CM

## 2022-05-02 DIAGNOSIS — Z79899 Other long term (current) drug therapy: Secondary | ICD-10-CM | POA: Diagnosis not present

## 2022-05-02 DIAGNOSIS — I255 Ischemic cardiomyopathy: Secondary | ICD-10-CM | POA: Diagnosis not present

## 2022-05-02 MED ORDER — FUROSEMIDE 40 MG PO TABS: 40.0000 mg | ORAL_TABLET | Freq: Every day | ORAL | 3 refills | Status: DC

## 2022-05-02 MED ORDER — POTASSIUM CHLORIDE ER 10 MEQ PO TBCR: 10.0000 meq | EXTENDED_RELEASE_TABLET | Freq: Every day | ORAL | 3 refills | Status: DC

## 2022-05-02 MED ORDER — VITAMIN D 50 MCG (2000 UT) PO CAPS: 2000.0000 [IU] | ORAL_CAPSULE | Freq: Every day | ORAL | 3 refills | Status: AC

## 2022-05-02 MED ORDER — PRAVASTATIN SODIUM 80 MG PO TABS: 80.0000 mg | ORAL_TABLET | Freq: Every evening | ORAL | 3 refills | Status: DC

## 2022-05-02 MED ORDER — ENTRESTO 49-51 MG PO TABS: 1.0000 | ORAL_TABLET | Freq: Two times a day (BID) | ORAL | 3 refills | Status: DC

## 2022-05-02 MED ORDER — CARVEDILOL 25 MG PO TABS: 25.0000 mg | ORAL_TABLET | Freq: Two times a day (BID) | ORAL | 2 refills | Status: DC

## 2022-05-02 NOTE — Patient Instructions (Signed)
Medication Instructions:  INCREASE Entresto to 49/52m twice daily *If you need a refill on your cardiac medications before your next appointment, please call your pharmacy*   Lab Work: BMET in 3 weeks If you have labs (blood work) drawn today and your tests are completely normal, you will receive your results only by: [place](if you have MyChart) OR A paper copy in the mail If you have any lab test that is abnormal or we need to change your treatment, we will call you to review the results.   Testing/Procedures: NONE   Follow-Up: At [facility] you and your health needs are our priority.  As part of our continuing mission to provide you with exceptional heart care, we have created designated Provider Care Teams.  These Care Teams include your primary Cardiologist (physician) and Advanced Practice Providers (APPs -  Physician Assistants and Nurse Practitioners) who all work together to provide you with the care you need, when you need it.  We recommend signing up for the patient portal called "MyChart".  Sign up information is provided on this After Visit Summary.  MyChart is used to connect with patients for Virtual Visits (Telemedicine).  Patients are able to view lab/test results, encounter notes, upcoming appointments, etc.  Non-urgent messages can be sent to your provider as well.   To learn more about what you can do with MyChart, go to [email or web address]    Your next appointment:   1 year(s)  Provider:   PMertie Moores MD

## 2022-05-03 LAB — LIPID PANEL
Chol/HDL Ratio: 3.5 ratio (ref 0.0–4.4)
Cholesterol, Total: 149 mg/dL (ref 100–199)
HDL: 43 mg/dL (ref >39)
LDL Chol Calc (NIH): 84 mg/dL (ref 0–99)
Triglycerides: 121 mg/dL (ref 0–149)
VLDL Cholesterol Cal: 22 mg/dL (ref 5–40)

## 2022-05-03 LAB — ALT: ALT: 16 IU/L (ref 0–32)

## 2022-05-30 ENCOUNTER — Ambulatory Visit: Payer: Medicare PPO

## 2022-05-30 DIAGNOSIS — I255 Ischemic cardiomyopathy: Secondary | ICD-10-CM

## 2022-05-31 LAB — CUP PACEART REMOTE DEVICE CHECK
Battery Remaining Longevity: 55 mo
Battery Voltage: 2.99 V
Brady Statistic RV Percent Paced: 0.01 %
Date Time Interrogation Session: 20240311012305
HighPow Impedance: 83 Ohm
Implantable Lead Connection Status: 753985
Implantable Lead Implant Date: 20160919
Implantable Lead Location: 753860
Implantable Lead Model: 6935
Implantable Pulse Generator Implant Date: 20160919
Lead Channel Impedance Value: 418 Ohm
Lead Channel Impedance Value: 513 Ohm
Lead Channel Pacing Threshold Amplitude: 0.75 V
Lead Channel Pacing Threshold Pulse Width: 0.4 ms
Lead Channel Sensing Intrinsic Amplitude: 13.625 mV
Lead Channel Sensing Intrinsic Amplitude: 13.625 mV
Lead Channel Setting Pacing Amplitude: 2 V
Lead Channel Setting Pacing Pulse Width: 0.4 ms
Lead Channel Setting Sensing Sensitivity: 0.3 mV

## 2022-07-06 NOTE — Progress Notes (Signed)
Remote ICD transmission.   

## 2022-07-09 ENCOUNTER — Other Ambulatory Visit: Payer: Self-pay | Admitting: Cardiology

## 2022-07-23 ENCOUNTER — Other Ambulatory Visit: Payer: Self-pay | Admitting: Student

## 2022-08-20 ENCOUNTER — Other Ambulatory Visit: Payer: Self-pay | Admitting: Student

## 2022-08-29 ENCOUNTER — Ambulatory Visit (INDEPENDENT_AMBULATORY_CARE_PROVIDER_SITE_OTHER): Payer: Medicare PPO

## 2022-08-29 DIAGNOSIS — I255 Ischemic cardiomyopathy: Secondary | ICD-10-CM

## 2022-08-29 LAB — CUP PACEART REMOTE DEVICE CHECK
Battery Remaining Longevity: 52 mo
Battery Voltage: 2.99 V
Brady Statistic RV Percent Paced: 0.02 %
Date Time Interrogation Session: 20240610033326
HighPow Impedance: 91 Ohm
Implantable Lead Connection Status: 753985
Implantable Lead Implant Date: 20160919
Implantable Lead Location: 753860
Implantable Lead Model: 6935
Implantable Pulse Generator Implant Date: 20160919
Lead Channel Impedance Value: 418 Ohm
Lead Channel Impedance Value: 513 Ohm
Lead Channel Pacing Threshold Amplitude: 0.75 V
Lead Channel Pacing Threshold Pulse Width: 0.4 ms
Lead Channel Sensing Intrinsic Amplitude: 11.5 mV
Lead Channel Sensing Intrinsic Amplitude: 11.5 mV
Lead Channel Setting Pacing Amplitude: 2 V
Lead Channel Setting Pacing Pulse Width: 0.4 ms
Lead Channel Setting Sensing Sensitivity: 0.3 mV

## 2022-09-19 NOTE — Progress Notes (Signed)
Remote ICD transmission.   

## 2022-10-18 ENCOUNTER — Other Ambulatory Visit: Payer: Self-pay | Admitting: Cardiovascular Disease

## 2022-11-28 ENCOUNTER — Ambulatory Visit (INDEPENDENT_AMBULATORY_CARE_PROVIDER_SITE_OTHER): Payer: Medicare PPO

## 2022-11-28 DIAGNOSIS — I255 Ischemic cardiomyopathy: Secondary | ICD-10-CM | POA: Diagnosis not present

## 2022-11-28 LAB — CUP PACEART REMOTE DEVICE CHECK
Battery Remaining Longevity: 50 mo
Battery Voltage: 2.99 V
Brady Statistic RV Percent Paced: 0.35 %
Date Time Interrogation Session: 20240909023325
HighPow Impedance: 81 Ohm
Implantable Lead Connection Status: 753985
Implantable Lead Implant Date: 20160919
Implantable Lead Location: 753860
Implantable Lead Model: 6935
Implantable Pulse Generator Implant Date: 20160919
Lead Channel Impedance Value: 456 Ohm
Lead Channel Impedance Value: 513 Ohm
Lead Channel Pacing Threshold Amplitude: 0.875 V
Lead Channel Pacing Threshold Pulse Width: 0.4 ms
Lead Channel Sensing Intrinsic Amplitude: 11.625 mV
Lead Channel Sensing Intrinsic Amplitude: 11.625 mV
Lead Channel Setting Pacing Amplitude: 2 V
Lead Channel Setting Pacing Pulse Width: 0.4 ms
Lead Channel Setting Sensing Sensitivity: 0.3 mV

## 2022-12-08 NOTE — Progress Notes (Signed)
Remote ICD transmission.   

## 2023-01-14 ENCOUNTER — Other Ambulatory Visit: Payer: Self-pay | Admitting: Cardiovascular Disease

## 2023-01-16 NOTE — Telephone Encounter (Signed)
Pt's pharmacy is requesting a refill on Entresto 24-26 mg tablets. Pt has two different strength of entresto. Please clarify which strength the pt is supposed to be taking? thanks

## 2023-01-18 NOTE — Addendum Note (Signed)
Addended by: Lars Mage on: 01/18/2023 08:25 AM   Modules accepted: Orders

## 2023-01-18 NOTE — Telephone Encounter (Signed)
Hey can you please D/C the entresto 24-26 mg, if pt is not on this strength? Thanks

## 2023-01-20 ENCOUNTER — Telehealth: Payer: Self-pay | Admitting: Cardiovascular Disease

## 2023-01-20 NOTE — Telephone Encounter (Signed)
Returned call to patient who states she noticed her R foot swelling, worse than left foot, about a week ago. She said it's not as bad as it has been, but since she has a hacking cough, wonders if both of these could mean she's retaining fluid. Denies SOB, doesn't weigh at home, and states that she knows she's been overeating so her pants are tighter, but she feels it's due to her diet. States swelling is not going up her leg at all, it's just in her foot. Advised that she may take an extra half tablet (20mg ) of furosemide for the next 3 days only but she will start this tomorrow and weigh herself before she does it. If weight hasn't decreased or swelling subsided, she will call us back. Verbalized understanding.

## 2023-01-20 NOTE — Telephone Encounter (Signed)
Pt c/o swelling/edema: STAT if pt has developed SOB within 24 hours  If swelling, where is the swelling located?  Both feet but more in left foot  How much weight have you gained and in what time span?   No  Have you gained 2 pounds in a day or 5 pounds in a week?   Unknown  Do you have a log of your daily weights (if so, list)?   No  Are you currently taking a fluid pill?   Yes  Are you currently SOB?   No  Have you traveled recently in a car or plane for an extended period of time?   Patient stated she is concerned she may need to increase her furosemide (LASIX) 40 MG tablet as she is having swelling in her feet  but more in her left foot and she had never had swelling like this before.  Patient stated she also has a cough.  Patient wants advise on next steps.

## 2023-02-27 ENCOUNTER — Ambulatory Visit (INDEPENDENT_AMBULATORY_CARE_PROVIDER_SITE_OTHER): Payer: Medicare PPO

## 2023-02-27 DIAGNOSIS — I255 Ischemic cardiomyopathy: Secondary | ICD-10-CM

## 2023-02-27 LAB — CUP PACEART REMOTE DEVICE CHECK
Battery Remaining Longevity: 44 mo
Battery Voltage: 2.99 V
Brady Statistic RV Percent Paced: 0.05 %
Date Time Interrogation Session: 20241209033627
HighPow Impedance: 77 Ohm
Implantable Lead Connection Status: 753985
Implantable Lead Implant Date: 20160919
Implantable Lead Location: 753860
Implantable Lead Model: 6935
Implantable Pulse Generator Implant Date: 20160919
Lead Channel Impedance Value: 418 Ohm
Lead Channel Impedance Value: 513 Ohm
Lead Channel Pacing Threshold Amplitude: 0.875 V
Lead Channel Pacing Threshold Pulse Width: 0.4 ms
Lead Channel Sensing Intrinsic Amplitude: 12.125 mV
Lead Channel Sensing Intrinsic Amplitude: 12.125 mV
Lead Channel Setting Pacing Amplitude: 2 V
Lead Channel Setting Pacing Pulse Width: 0.4 ms
Lead Channel Setting Sensing Sensitivity: 0.3 mV

## 2023-04-07 ENCOUNTER — Other Ambulatory Visit: Payer: Self-pay | Admitting: Cardiology

## 2023-05-01 ENCOUNTER — Other Ambulatory Visit: Payer: Self-pay | Admitting: Cardiovascular Disease

## 2023-05-01 DIAGNOSIS — Z79899 Other long term (current) drug therapy: Secondary | ICD-10-CM

## 2023-05-01 DIAGNOSIS — I255 Ischemic cardiomyopathy: Secondary | ICD-10-CM

## 2023-05-01 DIAGNOSIS — E785 Hyperlipidemia, unspecified: Secondary | ICD-10-CM

## 2023-05-01 DIAGNOSIS — I5022 Chronic systolic (congestive) heart failure: Secondary | ICD-10-CM

## 2023-05-02 ENCOUNTER — Encounter: Payer: Self-pay | Admitting: Cardiovascular Disease

## 2023-05-02 ENCOUNTER — Other Ambulatory Visit: Payer: Self-pay | Admitting: Cardiovascular Disease

## 2023-05-02 NOTE — Progress Notes (Unsigned)
Cardiology Office Note   Date:  05/03/2023   ID:  [name], DOB 10-03-1950, MRN [id number]  PCP:  Deloris Ping, MD  Cardiologist:   Kristeen Miss, MD   Chief Complaint  Patient presents with   Coronary Artery Disease   Congestive Heart Failure         1. Coronary artery disease-status post chronic LAD occlusion with a large anterior wall myocardial infarction 2. Congestive heart failure-EF of 30-40% 3. AICD placement 4. Hyperlipidemia 5 obesity  Previous notes:   [name] is doing very well from a cardiac standpoint. She's not had any episodes of chest pain or shortness of breath. She is able to do all of her normal activities without any significant problems.  She has not had much success in losing weight  She denies any syncope or presyncope.  September 14, 2012:  [name]  is doing well. She's not having any episodes of chest pain or shortness of breath.  Her ICD has been interrogated recently and was found to be working properly.   She has no CP or dyspnea when she does her regular activities.  She does not get any regular exercise.   June 13, 2013:  [name] is doing ok.  She is exercising some.   Walks about 20 minutes a day - several days a week.   No CP or dyspnea.     Feb. 25, 2016:  [name] is a 73 y.o. female who presents for follow up of her CAD and CHF.   Her ICD is working well.  She will need to have the battery checked more often recently because  it is nearing ERI.  She is not getting exercise.   Denies any chest pain  Or dyspnea.   Needs to lose weight.  [facility]   November 12, 2014:  Patient is doing well. No cp or dyspnea. Perhaps getting some exercise  Sept. 1, 2017:  Is doing well.  Followed for CHF and ICD placement  Has hx of chronic systlic CHF Breathing is good.  No CP   Oct. 2, 2018:    [name] is seen today for follow up ov her CAD, CHF and ICD placement .    Wt = 243 lbs. ( up 4 lbs since last years  visit )   January 11, 2018:   [name] is seen today for follow-up of her coronary artery disease, congestive heart failure and ICD placement.  Her weight today is 239 pounds. Still eating salty foods   - hot dogs once a week , bacon once month,  Canned veggies weekly .    Nov. 30, 2020:  Has tried to cut out her salt .  Wt is 247 lbs ( up 8 lbs from last year ) No exercise . Especially with covid   Jan. 4, 2022: [name] is seen today for follow up of her CAD, CHF.  Has an AICD  She was seen by Dr. Ladona Ridgel today  Wt. Is 255 lbs. 9 up 8 lbs from last year)  No cp, no dyspnea  Exercised during the summer.  None recently .   Going to start with a program  at [employer] .  Is on Lisinopril - we discussed Entresto  We discussed the washout of lisinopril that is needed.   October 19, 2020:  [name] is doing well from a cardiac standpoint  Wt is 240 lbs. Tolerating the eliquis well ( she  doesn t like the price )  Has noticed an improvement in her breathing   Feb. 2, 2023 Doing well , hx of remote ant. MI  Did well on Entresto but the [place] cost too much  We started Losartan in its place.  Needs to have a hysterectomy  Med list has her taking losartan and Lisinopril She will call us when she gets home to verify that she is not on both meds  She needs to have a hysterectomy.  She is not had any cardiac problems.  She is at low risk for her upcoming hysterectomy.  She may hold her aspirin for 5 to 7 days prior to the surgery.   Feb. 12, 2024 [name] is seen for follow up of her CAD, Ant. MI, CHF Has a history of VF arrest.  She is on amiodarone.  She is tolerating the amiodarone .  She has an ICD.  Feb. 12, 2025 [name] is seen for follow up of her CAD, Ant. MI, CHF Hx of VF arrest  Has an ICD  Wt is 257 lbs   No cp .   Labs from the [facility] from October 11, 2022 Labs from [facility] from October 11, 2022 Total cholesterol is 169 Triglyceride levels 116 HDL is  56 LDL is 92.  She is on Zetia and Pravachol.  Pravachol is the only statin that she tolerates.  She is on amiodarone for history of ventricular fibrillation arrest.  Her last TSH is normal.     Past Medical History:  Diagnosis Date   AICD (automatic cardioverter/defibrillator) present 12/08/2014   CAD (coronary artery disease)    CHRONICALLY OCCLUDED LAD WITH A LARGE ANTERIOR WALL MI    CHF (congestive heart failure) (HCC)    EF 30-40%   Dyslipidemia    H/O total hysterectomy 11/09/2021   Hypertension    ICD (implantable cardiac defibrillator), single, in situ    MEDTRONIC   Ischemic cardiomyopathy    Myocardial infarction [facility])    ANTERIOR WALL    Past Surgical History:  Procedure Laterality Date   CARDIAC CATHETERIZATION  01/13/05, 06/29/99   CARDIAC DEFIBRILLATOR PLACEMENT  02/21/05   SINGLE/MEDTRONIC   EP IMPLANTABLE DEVICE N/A 11/14/2014   Procedure:  ICD Generator Changeout with new lead & capping old 6949 lead;  Surgeon: Marinus Maw, MD;  Location: [facility] INVASIVE CV LAB;  Service: Cardiovascular;  Laterality: N/A;   EP IMPLANTABLE DEVICE N/A 12/08/2014   Procedure: ICD Implant;  Surgeon: Marinus Maw, MD;  Location: [facility] INVASIVE CV LAB;  Service: Cardiovascular;  Laterality: N/A;   LEFT HEART CATH AND CORONARY ANGIOGRAPHY N/A 04/28/2021   Procedure: LEFT HEART CATH AND CORONARY ANGIOGRAPHY;  Surgeon: Orbie Pyo, MD;  Location: [facility] INVASIVE CV LAB;  Service: Cardiovascular;  Laterality: N/A;     Current Outpatient Medications  Medication Sig Dispense Refill   acetaminophen (TYLENOL) 650 MG CR tablet Take 1,300 mg by mouth every 8 (eight) hours as needed for pain.     amiodarone (PACERONE) 200 MG tablet Take 1 tablet (200 mg total) by mouth daily. Please keep scheduled appointment for future refills. Thank you. 30 tablet 0   aspirin EC 81 MG tablet Take 81 mg by mouth daily.     busPIRone (BUSPAR) 5 MG tablet Take 5 mg by mouth 2 (two) times daily as needed.      carvedilol (COREG) 25 MG tablet Take 1 tablet (25 mg total) by mouth 2 (two)  times daily with a meal. 180 tablet 2   Cholecalciferol (VITAMIN D) 50 MCG (2000 UT) CAPS Take 1 capsule (2,000 Units total) by mouth daily. 90 capsule 3   citalopram (CELEXA) 40 MG tablet Take 20 mg by mouth daily.     ezetimibe (ZETIA) 10 MG tablet Take 1 tablet by mouth once daily 90 tablet 3   fexofenadine (ALLEGRA) 180 MG tablet Take 180 mg by mouth daily.     furosemide (LASIX) 40 MG tablet Take 1 tablet (40 mg total) by mouth daily. 90 tablet 3   mirtazapine (REMERON) 15 MG tablet Take 15 mg by mouth at bedtime as needed (sleep).     montelukast (SINGULAIR) 10 MG tablet Take 10 mg by mouth at bedtime.  0   potassium chloride (KLOR-CON) 10 MEQ tablet Take 1 tablet (10 mEq total) by mouth daily. 90 tablet 3   pravastatin (PRAVACHOL) 80 MG tablet Take 1 tablet (80 mg total) by mouth every evening. 90 tablet 3   promethazine (PHENERGAN) 12.5 MG tablet Take by mouth.     sacubitril-valsartan (ENTRESTO) 49-51 MG Take 1 tablet by mouth 2 (two) times daily. 180 tablet 3   SSD 1 % cream APPLY  CREAM TOPICALLY TO AFFECTED AREA ONCE DAILY     vitamin B-12 (CYANOCOBALAMIN) 100 MCG tablet Take 100 mcg by mouth daily.     No current facility-administered medications for this visit.    Allergies:   Codeine, Crestor [rosuvastatin], Doxycycline, Ecotrin [aspirin], Lipitor [atorvastatin], Nsaids, Penicillins, Sulfa drugs cross reactors, Vitamin c, and Zocor [simvastatin]    Social History:  The patient  reports that she has never smoked. She has never used smokeless tobacco. She reports that she does not drink alcohol and does not use drugs.   Family History:  The patient's family history includes Aneurysm in her brother; Coronary artery disease in her mother; Heart attack (age of onset: 47) in her mother; Hypertension in her father.    ROS:  Please see the history of present illness.    Physical Exam: Blood pressure (!)  118/56, pulse 63, height 5\' 2"  (1.575 m), weight 257 lb 9.6 oz (116.8 kg), SpO2 93%.       GEN:  morbidly obese female,  in no acute distress HEENT: Normal NECK: No JVD; No carotid bruits LYMPHATICS: No lymphadenopathy CARDIAC: RRR , no murmurs, rubs, gallops RESPIRATORY:  Clear to auscultation without rales, wheezing or rhonchi  ABDOMEN: Soft, non-tender, non-distended MUSCULOSKELETAL:  No edema; No deformity  SKIN: Warm and dry NEUROLOGIC:  Alert and oriented x 3  EKG Interpretation Date/Time:  Wednesday May 03 2023 14:03:41 EST Ventricular Rate:  63 PR Interval:  160 QRS Duration:  98 QT Interval:  418 QTC Calculation: 427 R Axis:   -32  Text Interpretation: Normal sinus rhythm Left axis deviation Low voltage QRS Cannot rule out Anterior infarct (cited on or before 27-Apr-2021) When compared with ECG of 27-Apr-2021 09:09, Premature ventricular complexes are no longer Present QRS axis Shifted left Nonspecific T wave abnormality now evident in Inferior leads Nonspecific T wave abnormality, worse in Anterolateral leads Confirmed by Kristeen Miss ([id number]) on 05/03/2023 2:16:26 PM   Recent Labs: No results found for requested labs within last 365 days.    Lipid Panel    Component Value Date/Time   CHOL 149 05/02/2022 1657   TRIG 121 05/02/2022 1657   HDL 43 05/02/2022 1657   CHOLHDL 3.5 05/02/2022 1657   CHOLHDL 3 11/10/2014 0926   VLDL 18.6  11/10/2014 0926   LDLCALC 84 05/02/2022 1657   LDLDIRECT 94 09/13/2021 1022      Wt Readings from Last 3 Encounters:  05/03/23 257 lb 9.6 oz (116.8 kg)  05/02/22 252 lb 9.6 oz (114.6 kg)  01/20/22 256 lb (116.1 kg)      Other studies Reviewed: Additional studies/ records that were reviewed today include: . Review of the above records demonstrates:    ASSESSMENT AND PLAN:  1. Coronary artery disease-     no angina     2. Congestive heart failure-EF of 30-40% -    S/p VF arrest.  Continue amiodarone      3. AICD  placement -   s/p VF arrest , on amiodarone      4. Hyperlipidemia -     her LDL is 92.   We discussed her LDL goal of < 70 .   5 obesity -  I encourage her to work on weight loss .       Current medicines are reviewed at length with the patient today.  The patient does not have concerns regarding medicines.  The following changes have been made:  no change   Disposition:           Signed, Kristeen Miss, MD  05/03/2023 2:16 PM    [facility] [address] [place], [place], [place]  [address] Phone: [phone]; Fax: [phone]

## 2023-05-03 ENCOUNTER — Ambulatory Visit: Payer: Medicare PPO | Attending: Cardiovascular Disease | Admitting: Cardiovascular Disease

## 2023-05-03 ENCOUNTER — Encounter: Payer: Self-pay | Admitting: Cardiovascular Disease

## 2023-05-03 VITALS — BP 118/56 | HR 63 | Ht 62.0 in | Wt 257.6 lb

## 2023-05-03 DIAGNOSIS — I5022 Chronic systolic (congestive) heart failure: Secondary | ICD-10-CM

## 2023-05-03 DIAGNOSIS — I4901 Ventricular fibrillation: Secondary | ICD-10-CM

## 2023-05-03 DIAGNOSIS — I255 Ischemic cardiomyopathy: Secondary | ICD-10-CM | POA: Diagnosis not present

## 2023-05-03 NOTE — Patient Instructions (Signed)
Follow-Up: At [facility], you and your health needs are our priority.  As part of our continuing mission to provide you with exceptional heart care, we have created designated Provider Care Teams.  These Care Teams include your primary Cardiologist (physician) and Advanced Practice Providers (APPs -  Physician Assistants and Nurse Practitioners) who all work together to provide you with the care you need, when you need it.   Your next appointment:   1 year(s)  Provider:   Chilton Si, MD   1st Floor: - Lobby - Registration  - Pharmacy  - Lab - Cafe  2nd Floor: - PV Lab - Diagnostic Testing (echo, CT, nuclear med)  3rd Floor: - Vacant  4th Floor: - TCTS (cardiothoracic surgery) - AFib Clinic - Structural Heart Clinic - Vascular Surgery  - Vascular Ultrasound  5th Floor: - HeartCare Cardiology (general and EP) - Clinical Pharmacy for coumadin, hypertension, lipid, weight-loss medications, and med management appointments    Valet parking services will be available as well.

## 2023-05-29 ENCOUNTER — Ambulatory Visit (INDEPENDENT_AMBULATORY_CARE_PROVIDER_SITE_OTHER): Payer: Medicare Other

## 2023-05-29 DIAGNOSIS — I5022 Chronic systolic (congestive) heart failure: Secondary | ICD-10-CM

## 2023-05-29 DIAGNOSIS — I255 Ischemic cardiomyopathy: Secondary | ICD-10-CM | POA: Diagnosis not present

## 2023-05-30 LAB — CUP PACEART REMOTE DEVICE CHECK
Battery Remaining Longevity: 42 mo
Battery Voltage: 2.98 V
Brady Statistic RV Percent Paced: 0.01 %
Date Time Interrogation Session: 20250310042207
HighPow Impedance: 78 Ohm
Implantable Lead Connection Status: 753985
Implantable Lead Implant Date: 20160919
Implantable Lead Location: 753860
Implantable Lead Model: 6935
Implantable Pulse Generator Implant Date: 20160919
Lead Channel Impedance Value: 418 Ohm
Lead Channel Impedance Value: 475 Ohm
Lead Channel Pacing Threshold Amplitude: 0.875 V
Lead Channel Pacing Threshold Pulse Width: 0.4 ms
Lead Channel Sensing Intrinsic Amplitude: 14 mV
Lead Channel Sensing Intrinsic Amplitude: 14 mV
Lead Channel Setting Pacing Amplitude: 2 V
Lead Channel Setting Pacing Pulse Width: 0.4 ms
Lead Channel Setting Sensing Sensitivity: 0.3 mV

## 2023-06-01 ENCOUNTER — Encounter: Payer: Self-pay | Admitting: Cardiology

## 2023-06-01 ENCOUNTER — Other Ambulatory Visit: Payer: Self-pay | Admitting: Cardiovascular Disease

## 2023-06-01 DIAGNOSIS — I5022 Chronic systolic (congestive) heart failure: Secondary | ICD-10-CM

## 2023-06-01 DIAGNOSIS — E785 Hyperlipidemia, unspecified: Secondary | ICD-10-CM

## 2023-06-01 DIAGNOSIS — Z79899 Other long term (current) drug therapy: Secondary | ICD-10-CM

## 2023-06-01 DIAGNOSIS — I255 Ischemic cardiomyopathy: Secondary | ICD-10-CM

## 2023-06-03 ENCOUNTER — Other Ambulatory Visit: Payer: Self-pay | Admitting: Cardiovascular Disease

## 2023-06-29 NOTE — Addendum Note (Signed)
 Addended by: Geralyn Flash D on: 06/29/2023 04:03 PM   Modules accepted: Orders

## 2023-06-29 NOTE — Progress Notes (Signed)
 Remote ICD transmission.

## 2023-07-10 ENCOUNTER — Other Ambulatory Visit: Payer: Self-pay | Admitting: Cardiovascular Disease

## 2023-07-10 DIAGNOSIS — Z79899 Other long term (current) drug therapy: Secondary | ICD-10-CM

## 2023-07-10 DIAGNOSIS — I5022 Chronic systolic (congestive) heart failure: Secondary | ICD-10-CM

## 2023-07-10 DIAGNOSIS — I255 Ischemic cardiomyopathy: Secondary | ICD-10-CM

## 2023-07-10 DIAGNOSIS — E785 Hyperlipidemia, unspecified: Secondary | ICD-10-CM

## 2023-07-16 ENCOUNTER — Other Ambulatory Visit: Payer: Self-pay | Admitting: Cardiovascular Disease

## 2023-07-16 DIAGNOSIS — I5022 Chronic systolic (congestive) heart failure: Secondary | ICD-10-CM

## 2023-07-16 DIAGNOSIS — Z79899 Other long term (current) drug therapy: Secondary | ICD-10-CM

## 2023-07-16 DIAGNOSIS — I255 Ischemic cardiomyopathy: Secondary | ICD-10-CM

## 2023-07-16 DIAGNOSIS — E785 Hyperlipidemia, unspecified: Secondary | ICD-10-CM

## 2023-08-28 ENCOUNTER — Ambulatory Visit (INDEPENDENT_AMBULATORY_CARE_PROVIDER_SITE_OTHER): Payer: Medicare Other

## 2023-08-28 DIAGNOSIS — I255 Ischemic cardiomyopathy: Secondary | ICD-10-CM

## 2023-08-28 DIAGNOSIS — I5022 Chronic systolic (congestive) heart failure: Secondary | ICD-10-CM

## 2023-08-29 LAB — CUP PACEART REMOTE DEVICE CHECK
Battery Remaining Longevity: 36 mo
Battery Voltage: 2.97 V
Brady Statistic RV Percent Paced: 0.01 %
Date Time Interrogation Session: 20250609052708
HighPow Impedance: 81 Ohm
Implantable Lead Connection Status: 753985
Implantable Lead Implant Date: 20160919
Implantable Lead Location: 753860
Implantable Lead Model: 6935
Implantable Pulse Generator Implant Date: 20160919
Lead Channel Impedance Value: 418 Ohm
Lead Channel Impedance Value: 513 Ohm
Lead Channel Pacing Threshold Amplitude: 0.875 V
Lead Channel Pacing Threshold Pulse Width: 0.4 ms
Lead Channel Sensing Intrinsic Amplitude: 11.625 mV
Lead Channel Sensing Intrinsic Amplitude: 11.625 mV
Lead Channel Setting Pacing Amplitude: 2 V
Lead Channel Setting Pacing Pulse Width: 0.4 ms
Lead Channel Setting Sensing Sensitivity: 0.3 mV

## 2023-09-02 ENCOUNTER — Ambulatory Visit: Payer: Self-pay | Admitting: Cardiology

## 2023-10-02 ENCOUNTER — Other Ambulatory Visit: Payer: Self-pay | Admitting: Cardiovascular Disease

## 2023-10-02 DIAGNOSIS — Z79899 Other long term (current) drug therapy: Secondary | ICD-10-CM

## 2023-10-02 DIAGNOSIS — I5022 Chronic systolic (congestive) heart failure: Secondary | ICD-10-CM

## 2023-10-02 DIAGNOSIS — E785 Hyperlipidemia, unspecified: Secondary | ICD-10-CM

## 2023-10-02 DIAGNOSIS — I255 Ischemic cardiomyopathy: Secondary | ICD-10-CM

## 2023-10-10 NOTE — Progress Notes (Signed)
 Remote ICD transmission.

## 2023-10-10 NOTE — Addendum Note (Signed)
 Addended by: TAWNI DRILLING D on: 10/10/2023 04:44 PM   Modules accepted: Orders

## 2023-11-27 ENCOUNTER — Ambulatory Visit (INDEPENDENT_AMBULATORY_CARE_PROVIDER_SITE_OTHER): Payer: Medicare Other

## 2023-11-27 DIAGNOSIS — I255 Ischemic cardiomyopathy: Secondary | ICD-10-CM

## 2023-11-28 LAB — CUP PACEART REMOTE DEVICE CHECK
Battery Remaining Longevity: 38 mo
Battery Voltage: 2.97 V
Brady Statistic RV Percent Paced: 0.01 %
Date Time Interrogation Session: 20250908043824
HighPow Impedance: 91 Ohm
Implantable Lead Connection Status: 753985
Implantable Lead Implant Date: 20160919
Implantable Lead Location: 753860
Implantable Lead Model: 6935
Implantable Pulse Generator Implant Date: 20160919
Lead Channel Impedance Value: 399 Ohm
Lead Channel Impedance Value: 475 Ohm
Lead Channel Pacing Threshold Amplitude: 0.75 V
Lead Channel Pacing Threshold Pulse Width: 0.4 ms
Lead Channel Sensing Intrinsic Amplitude: 15.125 mV
Lead Channel Sensing Intrinsic Amplitude: 15.125 mV
Lead Channel Setting Pacing Amplitude: 2 V
Lead Channel Setting Pacing Pulse Width: 0.4 ms
Lead Channel Setting Sensing Sensitivity: 0.3 mV

## 2023-12-01 ENCOUNTER — Ambulatory Visit: Payer: Self-pay | Admitting: Cardiology

## 2023-12-06 NOTE — Progress Notes (Unsigned)
  Electrophysiology Office Note:   ID:  [name], DOB 04-26-1950, MRN [id number]  Primary Cardiologist: Annabella Scarce, MD Electrophysiologist: OLE ONEIDA HOLTS, MD  {Click to update primary MD,subspecialty MD or APP then REFRESH:1}    History of Present Illness:   [name] is a 73 y.o. female with h/o ICM, CAD, VT/VF s/p ICD, and Chronic systolic CHF seen today for routine electrophysiology followup.   Last seen in EP clinic 01/2022  Since last being seen in our clinic the patient reports doing ***.  she denies chest pain, palpitations, dyspnea, PND, orthopnea, nausea, vomiting, dizziness, syncope, edema, weight gain, or early satiety.   Review of systems complete and found to be negative unless listed in HPI.   EP Information / Studies Reviewed:    EKG is ordered today. Personal review as below.       ICD Interrogation-  reviewed in detail today,  See PACEART report.  Arrhythmia/Device History Medtronic single chamber ICD 2006 with [id number] lead. 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   Wt Readings from Last 3 Encounters:  05/03/23 257 lb 9.6 oz (116.8 kg)  05/02/22 252 lb 9.6 oz (114.6 kg)  01/20/22 256 lb (116.1 kg)     GEN: No acute distress *** NECK: No JVD; No carotid bruits CARDIAC: {EPRHYTHM:28826}, no murmurs, rubs, gallops RESPIRATORY:  Clear to auscultation without rales, wheezing or rhonchi  ABDOMEN: Soft, non-tender, non-distended EXTREMITIES:  {EDEMA LEVEL:28147::No} edema; No deformity   ASSESSMENT AND PLAN:    Chronic systolic CHF  s/p Medtronic single chamber ICD  euvolemic today Stable on an appropriate medical regimen Normal ICD function See Pace Art report No changes today  VT Continue amiodarone  200 mg daily Surveillance labs today  CAD No s/s of ischemia.       Disposition:   Follow up with {EPPROVIDERS:28135::EP Team} {EPFOLLOW UP:28173}   Signed, Ozell Prentice Passey, PA-C

## 2023-12-07 ENCOUNTER — Encounter: Payer: Self-pay | Admitting: Student

## 2023-12-07 ENCOUNTER — Ambulatory Visit: Payer: Self-pay | Admitting: Cardiology

## 2023-12-07 ENCOUNTER — Ambulatory Visit: Attending: Student | Admitting: Student

## 2023-12-07 VITALS — BP 120/70 | HR 64 | Ht 62.0 in | Wt 243.0 lb

## 2023-12-07 DIAGNOSIS — I5022 Chronic systolic (congestive) heart failure: Secondary | ICD-10-CM | POA: Diagnosis not present

## 2023-12-07 DIAGNOSIS — I2510 Atherosclerotic heart disease of native coronary artery without angina pectoris: Secondary | ICD-10-CM

## 2023-12-07 DIAGNOSIS — I472 Ventricular tachycardia, unspecified: Secondary | ICD-10-CM | POA: Diagnosis not present

## 2023-12-07 DIAGNOSIS — I2583 Coronary atherosclerosis due to lipid rich plaque: Secondary | ICD-10-CM

## 2023-12-07 LAB — CUP PACEART INCLINIC DEVICE CHECK
Battery Remaining Longevity: 42 mo
Battery Voltage: 2.97 V
Brady Statistic RV Percent Paced: 0.06 %
Date Time Interrogation Session: 20250918111451
HighPow Impedance: 78 Ohm
Implantable Lead Connection Status: 753985
Implantable Lead Implant Date: 20160919
Implantable Lead Location: 753860
Implantable Lead Model: 6935
Implantable Pulse Generator Implant Date: 20160919
Lead Channel Impedance Value: 418 Ohm
Lead Channel Impedance Value: 513 Ohm
Lead Channel Pacing Threshold Amplitude: 0.875 V
Lead Channel Pacing Threshold Pulse Width: 0.4 ms
Lead Channel Sensing Intrinsic Amplitude: 13.125 mV
Lead Channel Sensing Intrinsic Amplitude: 17.625 mV
Lead Channel Setting Pacing Amplitude: 2 V
Lead Channel Setting Pacing Pulse Width: 0.4 ms
Lead Channel Setting Sensing Sensitivity: 0.3 mV

## 2023-12-07 NOTE — Progress Notes (Signed)
Remote ICD Transmission.

## 2023-12-07 NOTE — Patient Instructions (Signed)
 Medication Instructions:  Your physician recommends that you continue on your current medications as directed. Please refer to the Current Medication list given to you today.  *If you need a refill on your cardiac medications before your next appointment, please call your pharmacy*  Lab Work: CMET, TSH, FreeT4-TODAY If you have labs (blood work) drawn today and your tests are completely normal, you will receive your results only by: MyChart Message (if you have MyChart) OR A paper copy in the mail If you have any lab test that is abnormal or we need to change your treatment, we will call you to review the results.  Follow-Up: At [facility], you and your health needs are our priority.  As part of our continuing mission to provide you with exceptional heart care, our providers are all part of one team.  This team includes your primary Cardiologist (physician) and Advanced Practice Providers or APPs (Physician Assistants and Nurse Practitioners) who all work together to provide you with the care you need, when you need it.  Your next appointment:   6 month(s)  Provider:   Bambi Lever "Michaelle Adolphus, PA-C

## 2023-12-08 LAB — T4, FREE: Free T4: 1.55 ng/dL (ref 0.82–1.77)

## 2023-12-08 LAB — COMPREHENSIVE METABOLIC PANEL WITH GFR
ALT: 15 IU/L (ref 0–32)
AST: 22 IU/L (ref 0–40)
Albumin: 3.8 g/dL (ref 3.8–4.8)
Alkaline Phosphatase: 79 IU/L (ref 49–135)
BUN/Creatinine Ratio: 13 (ref 12–28)
BUN: 14 mg/dL (ref 8–27)
Bilirubin Total: 0.5 mg/dL (ref 0.0–1.2)
CO2: 27 mmol/L (ref 20–29)
Calcium: 8.7 mg/dL (ref 8.7–10.3)
Chloride: 103 mmol/L (ref 96–106)
Creatinine, Ser: 1.08 mg/dL — ABNORMAL HIGH (ref 0.57–1.00)
Globulin, Total: 2 g/dL (ref 1.5–4.5)
Glucose: 85 mg/dL (ref 70–99)
Potassium: 4.2 mmol/L (ref 3.5–5.2)
Sodium: 144 mmol/L (ref 134–144)
Total Protein: 5.8 g/dL — ABNORMAL LOW (ref 6.0–8.5)
eGFR: 54 mL/min/1.73 — ABNORMAL LOW (ref >59)

## 2023-12-08 LAB — TSH: TSH: 0.858 u[IU]/mL (ref 0.450–4.500)

## 2024-01-01 ENCOUNTER — Other Ambulatory Visit: Payer: Self-pay

## 2024-01-01 DIAGNOSIS — I5022 Chronic systolic (congestive) heart failure: Secondary | ICD-10-CM

## 2024-01-01 DIAGNOSIS — E785 Hyperlipidemia, unspecified: Secondary | ICD-10-CM

## 2024-01-01 DIAGNOSIS — I255 Ischemic cardiomyopathy: Secondary | ICD-10-CM

## 2024-01-01 DIAGNOSIS — Z79899 Other long term (current) drug therapy: Secondary | ICD-10-CM

## 2024-01-04 MED ORDER — POTASSIUM CHLORIDE ER 10 MEQ PO TBCR
10.0000 meq | EXTENDED_RELEASE_TABLET | Freq: Every day | ORAL | 1 refills | Status: AC
Start: 2024-01-04 — End: ?

## 2024-02-26 ENCOUNTER — Ambulatory Visit: Payer: Medicare Other

## 2024-02-27 LAB — CUP PACEART REMOTE DEVICE CHECK
Battery Remaining Longevity: 33 mo
Battery Voltage: 2.97 V
Brady Statistic RV Percent Paced: 0.01 %
Date Time Interrogation Session: 20251208033524
HighPow Impedance: 74 Ohm
Implantable Lead Connection Status: 753985
Implantable Lead Implant Date: 20160919
Implantable Lead Location: 753860
Implantable Lead Model: 6935
Implantable Pulse Generator Implant Date: 20160919
Lead Channel Impedance Value: 418 Ohm
Lead Channel Impedance Value: 475 Ohm
Lead Channel Pacing Threshold Amplitude: 0.875 V
Lead Channel Pacing Threshold Pulse Width: 0.4 ms
Lead Channel Sensing Intrinsic Amplitude: 15.375 mV
Lead Channel Sensing Intrinsic Amplitude: 15.375 mV
Lead Channel Setting Pacing Amplitude: 2 V
Lead Channel Setting Pacing Pulse Width: 0.4 ms
Lead Channel Setting Sensing Sensitivity: 0.3 mV

## 2024-02-28 ENCOUNTER — Ambulatory Visit: Payer: Self-pay | Admitting: Cardiology

## 2024-03-05 NOTE — Progress Notes (Signed)
 Remote ICD Transmission

## 2024-03-18 ENCOUNTER — Telehealth (HOSPITAL_BASED_OUTPATIENT_CLINIC_OR_DEPARTMENT_OTHER): Payer: Self-pay | Admitting: Cardiology

## 2024-03-18 ENCOUNTER — Other Ambulatory Visit (HOSPITAL_COMMUNITY): Payer: Self-pay

## 2024-03-18 ENCOUNTER — Telehealth (HOSPITAL_BASED_OUTPATIENT_CLINIC_OR_DEPARTMENT_OTHER): Payer: Self-pay | Admitting: Cardiovascular Disease

## 2024-03-18 DIAGNOSIS — Z79899 Other long term (current) drug therapy: Secondary | ICD-10-CM

## 2024-03-18 DIAGNOSIS — I255 Ischemic cardiomyopathy: Secondary | ICD-10-CM

## 2024-03-18 DIAGNOSIS — I5022 Chronic systolic (congestive) heart failure: Secondary | ICD-10-CM

## 2024-03-18 DIAGNOSIS — E785 Hyperlipidemia, unspecified: Secondary | ICD-10-CM

## 2024-03-18 NOTE — Telephone Encounter (Signed)
 Pt c/o medication issue:  1. Name of Medication:   sacubitril -valsartan  (ENTRESTO ) 49-51 MG   2. How are you currently taking this medication (dosage and times per day)?   As prescribed  3. Are you having a reaction (difficulty breathing--STAT)?   4. What is your medication issue?    Patient wants a letter sent to her insurance company [facility]) stating she will need this medication so they will continue to cover the medication.

## 2024-03-18 NOTE — Telephone Encounter (Signed)
 Spoke with patient regarding Entresto  She is already taking generic Will forward to PA team to initiate beginning of year

## 2024-03-18 NOTE — Telephone Encounter (Signed)
 Plan prefers brand, no PA needed on brand   $0 copay

## 2024-03-21 ENCOUNTER — Other Ambulatory Visit: Payer: Self-pay | Admitting: Cardiology

## 2024-04-05 MED ORDER — SACUBITRIL-VALSARTAN 49-51 MG PO TABS: 1.0000 | ORAL_TABLET | Freq: Two times a day (BID) | ORAL | 3 refills | Status: AC

## 2024-04-10 NOTE — Telephone Encounter (Signed)
 Medication was resent on 1/16

## 2024-05-27 ENCOUNTER — Ambulatory Visit: Payer: Medicare Other

## 2024-06-06 ENCOUNTER — Ambulatory Visit (HOSPITAL_BASED_OUTPATIENT_CLINIC_OR_DEPARTMENT_OTHER): Admitting: Cardiology

## 2024-08-26 ENCOUNTER — Ambulatory Visit: Payer: Medicare Other

## 2024-11-26 ENCOUNTER — Ambulatory Visit: Payer: Medicare Other

## 2025-02-24 ENCOUNTER — Ambulatory Visit: Payer: Medicare Other
